# Patient Record
Sex: Male | Born: 1950 | Race: White | Hispanic: No | Smoking: Former smoker
Health system: Southern US, Community
[De-identification: ages and names within clinical notes are randomized; demographics above are authoritative.]

## PROBLEM LIST (undated history)

## (undated) ENCOUNTER — Emergency Department (HOSPITAL_COMMUNITY): Discharge status: Absent without leave | Payer: Medicare Other

## (undated) DIAGNOSIS — J342 Deviated nasal septum: Secondary | ICD-10-CM

## (undated) DIAGNOSIS — I480 Paroxysmal atrial fibrillation: Secondary | ICD-10-CM

## (undated) DIAGNOSIS — D689 Coagulation defect, unspecified: Secondary | ICD-10-CM

## (undated) DIAGNOSIS — F418 Other specified anxiety disorders: Secondary | ICD-10-CM

## (undated) DIAGNOSIS — N401 Enlarged prostate with lower urinary tract symptoms: Secondary | ICD-10-CM

## (undated) DIAGNOSIS — F32A Depression, unspecified: Secondary | ICD-10-CM

## (undated) DIAGNOSIS — I499 Cardiac arrhythmia, unspecified: Secondary | ICD-10-CM

## (undated) DIAGNOSIS — E785 Hyperlipidemia, unspecified: Secondary | ICD-10-CM

## (undated) DIAGNOSIS — Z87442 Personal history of urinary calculi: Secondary | ICD-10-CM

## (undated) DIAGNOSIS — I219 Acute myocardial infarction, unspecified: Secondary | ICD-10-CM

## (undated) DIAGNOSIS — Z8619 Personal history of other infectious and parasitic diseases: Secondary | ICD-10-CM

## (undated) DIAGNOSIS — F329 Major depressive disorder, single episode, unspecified: Secondary | ICD-10-CM

## (undated) DIAGNOSIS — I2510 Atherosclerotic heart disease of native coronary artery without angina pectoris: Secondary | ICD-10-CM

## (undated) DIAGNOSIS — N138 Other obstructive and reflux uropathy: Secondary | ICD-10-CM

## (undated) DIAGNOSIS — I10 Essential (primary) hypertension: Secondary | ICD-10-CM

## (undated) DIAGNOSIS — M545 Low back pain, unspecified: Secondary | ICD-10-CM

## (undated) DIAGNOSIS — IMO0001 Reserved for inherently not codable concepts without codable children: Secondary | ICD-10-CM

## (undated) DIAGNOSIS — F419 Anxiety disorder, unspecified: Secondary | ICD-10-CM

## (undated) DIAGNOSIS — E119 Type 2 diabetes mellitus without complications: Secondary | ICD-10-CM

## (undated) DIAGNOSIS — G8929 Other chronic pain: Secondary | ICD-10-CM

## (undated) DIAGNOSIS — M1990 Unspecified osteoarthritis, unspecified site: Secondary | ICD-10-CM

## (undated) DIAGNOSIS — G2581 Restless legs syndrome: Secondary | ICD-10-CM

## (undated) DIAGNOSIS — J60 Coalworker's pneumoconiosis: Secondary | ICD-10-CM

## (undated) DIAGNOSIS — M19011 Primary osteoarthritis, right shoulder: Secondary | ICD-10-CM

## (undated) HISTORY — DX: Other obstructive and reflux uropathy: N40.1

## (undated) HISTORY — DX: Coagulation defect, unspecified: D68.9

## (undated) HISTORY — DX: Low back pain, unspecified: M54.50

## (undated) HISTORY — DX: Other chronic pain: G89.29

## (undated) HISTORY — DX: Other obstructive and reflux uropathy: N13.8

## (undated) HISTORY — DX: Restless legs syndrome: G25.81

## (undated) HISTORY — DX: Personal history of other infectious and parasitic diseases: Z86.19

## (undated) HISTORY — DX: Low back pain: M54.5

## (undated) HISTORY — DX: Type 2 diabetes mellitus without complications: E11.9

## (undated) HISTORY — PX: JOINT REPLACEMENT: SHX530

## (undated) HISTORY — PX: EYE SURGERY: SHX253

## (undated) HISTORY — DX: Atherosclerotic heart disease of native coronary artery without angina pectoris: I25.10

## (undated) HISTORY — DX: Other specified anxiety disorders: F41.8

## (undated) HISTORY — DX: Coalworker's pneumoconiosis: J60

## (undated) HISTORY — DX: Hyperlipidemia, unspecified: E78.5

## (undated) HISTORY — DX: Primary osteoarthritis, right shoulder: M19.011

## (undated) HISTORY — PX: WRIST SURGERY: SHX841

## (undated) HISTORY — DX: Essential (primary) hypertension: I10

---

## 1991-01-06 HISTORY — PX: WRIST SURGERY: SHX841

## 2009-01-05 DIAGNOSIS — I219 Acute myocardial infarction, unspecified: Secondary | ICD-10-CM

## 2009-01-05 HISTORY — DX: Acute myocardial infarction, unspecified: I21.9

## 2010-01-05 DIAGNOSIS — I2510 Atherosclerotic heart disease of native coronary artery without angina pectoris: Secondary | ICD-10-CM

## 2010-01-05 HISTORY — PX: CATARACT EXTRACTION W/ INTRAOCULAR LENS IMPLANT: SHX1309

## 2010-01-05 HISTORY — DX: Atherosclerotic heart disease of native coronary artery without angina pectoris: I25.10

## 2010-02-05 HISTORY — PX: CORONARY ANGIOPLASTY WITH STENT PLACEMENT: SHX49

## 2010-11-23 DIAGNOSIS — M7500 Adhesive capsulitis of unspecified shoulder: Secondary | ICD-10-CM | POA: Insufficient documentation

## 2010-11-24 DIAGNOSIS — M19012 Primary osteoarthritis, left shoulder: Secondary | ICD-10-CM | POA: Insufficient documentation

## 2012-09-05 HISTORY — PX: COLONOSCOPY W/ POLYPECTOMY: SHX1380

## 2012-09-22 LAB — CBC AND DIFFERENTIAL
Hemoglobin: 14.3 g/dL (ref 13.5–17.5)
Platelets: 186 10*3/uL (ref 150–399)
WBC: 3.8 10^3/mL

## 2012-09-22 LAB — HM COLONOSCOPY

## 2014-01-05 DIAGNOSIS — M19011 Primary osteoarthritis, right shoulder: Secondary | ICD-10-CM

## 2014-01-05 HISTORY — DX: Primary osteoarthritis, right shoulder: M19.011

## 2014-01-16 DIAGNOSIS — F330 Major depressive disorder, recurrent, mild: Secondary | ICD-10-CM | POA: Diagnosis not present

## 2014-01-23 DIAGNOSIS — H43399 Other vitreous opacities, unspecified eye: Secondary | ICD-10-CM | POA: Diagnosis not present

## 2014-01-23 DIAGNOSIS — H0289 Other specified disorders of eyelid: Secondary | ICD-10-CM | POA: Diagnosis not present

## 2014-01-23 DIAGNOSIS — H26499 Other secondary cataract, unspecified eye: Secondary | ICD-10-CM | POA: Diagnosis not present

## 2014-01-23 DIAGNOSIS — Z961 Presence of intraocular lens: Secondary | ICD-10-CM | POA: Diagnosis not present

## 2014-02-01 DIAGNOSIS — M19011 Primary osteoarthritis, right shoulder: Secondary | ICD-10-CM | POA: Diagnosis not present

## 2014-02-01 DIAGNOSIS — M25511 Pain in right shoulder: Secondary | ICD-10-CM | POA: Diagnosis not present

## 2014-03-13 DIAGNOSIS — F330 Major depressive disorder, recurrent, mild: Secondary | ICD-10-CM | POA: Diagnosis not present

## 2014-04-17 DIAGNOSIS — J329 Chronic sinusitis, unspecified: Secondary | ICD-10-CM | POA: Diagnosis not present

## 2014-04-17 DIAGNOSIS — I10 Essential (primary) hypertension: Secondary | ICD-10-CM | POA: Diagnosis not present

## 2014-04-17 DIAGNOSIS — D649 Anemia, unspecified: Secondary | ICD-10-CM | POA: Diagnosis not present

## 2014-05-08 DIAGNOSIS — F330 Major depressive disorder, recurrent, mild: Secondary | ICD-10-CM | POA: Diagnosis not present

## 2014-07-03 DIAGNOSIS — F330 Major depressive disorder, recurrent, mild: Secondary | ICD-10-CM | POA: Diagnosis not present

## 2014-07-05 DIAGNOSIS — M25511 Pain in right shoulder: Secondary | ICD-10-CM | POA: Diagnosis not present

## 2014-08-14 DIAGNOSIS — N401 Enlarged prostate with lower urinary tract symptoms: Secondary | ICD-10-CM | POA: Diagnosis not present

## 2014-08-15 DIAGNOSIS — I259 Chronic ischemic heart disease, unspecified: Secondary | ICD-10-CM | POA: Diagnosis not present

## 2014-08-15 DIAGNOSIS — I10 Essential (primary) hypertension: Secondary | ICD-10-CM | POA: Diagnosis not present

## 2014-08-15 DIAGNOSIS — R739 Hyperglycemia, unspecified: Secondary | ICD-10-CM | POA: Diagnosis not present

## 2014-08-28 DIAGNOSIS — F330 Major depressive disorder, recurrent, mild: Secondary | ICD-10-CM | POA: Diagnosis not present

## 2014-11-05 DIAGNOSIS — F330 Major depressive disorder, recurrent, mild: Secondary | ICD-10-CM | POA: Diagnosis not present

## 2014-11-05 DIAGNOSIS — Z23 Encounter for immunization: Secondary | ICD-10-CM | POA: Diagnosis not present

## 2015-01-01 DIAGNOSIS — F330 Major depressive disorder, recurrent, mild: Secondary | ICD-10-CM | POA: Diagnosis not present

## 2015-01-01 DIAGNOSIS — F22 Delusional disorders: Secondary | ICD-10-CM | POA: Diagnosis not present

## 2015-01-24 DIAGNOSIS — M25519 Pain in unspecified shoulder: Secondary | ICD-10-CM | POA: Diagnosis not present

## 2015-01-24 DIAGNOSIS — E7800 Pure hypercholesterolemia, unspecified: Secondary | ICD-10-CM | POA: Diagnosis not present

## 2015-01-24 DIAGNOSIS — I10 Essential (primary) hypertension: Secondary | ICD-10-CM | POA: Diagnosis not present

## 2015-01-29 DIAGNOSIS — E7800 Pure hypercholesterolemia, unspecified: Secondary | ICD-10-CM | POA: Diagnosis not present

## 2015-01-29 DIAGNOSIS — H43393 Other vitreous opacities, bilateral: Secondary | ICD-10-CM | POA: Diagnosis not present

## 2015-01-29 DIAGNOSIS — Z961 Presence of intraocular lens: Secondary | ICD-10-CM | POA: Diagnosis not present

## 2015-01-29 DIAGNOSIS — H26493 Other secondary cataract, bilateral: Secondary | ICD-10-CM | POA: Diagnosis not present

## 2015-01-29 DIAGNOSIS — D3132 Benign neoplasm of left choroid: Secondary | ICD-10-CM | POA: Diagnosis not present

## 2015-01-29 LAB — COMPLETE METABOLIC PANEL WITH GFR
ALK PHOS: 72 U/L
ALT: 29
AST: 29 U/L
BILIRUBIN, TOTAL: 0.5
Creat: 0.72
Glucose: 119
SODIUM: 142

## 2015-01-29 LAB — LIPID PANEL
Cholesterol: 172
HDL: 43
LDL (calc): 109
Triglycerides: 101

## 2015-01-29 LAB — CBC
HEMOGLOBIN: 14.7 g/dL
WBC: 4.6
platelet count: 187

## 2015-02-26 DIAGNOSIS — F330 Major depressive disorder, recurrent, mild: Secondary | ICD-10-CM | POA: Diagnosis not present

## 2015-06-18 ENCOUNTER — Ambulatory Visit (INDEPENDENT_AMBULATORY_CARE_PROVIDER_SITE_OTHER): Payer: Medicare Other | Admitting: Family Medicine

## 2015-06-18 ENCOUNTER — Encounter: Payer: Self-pay | Admitting: Family Medicine

## 2015-06-18 VITALS — BP 114/78 | HR 72 | Temp 98.3°F | Ht 67.0 in | Wt 212.2 lb

## 2015-06-18 DIAGNOSIS — E1169 Type 2 diabetes mellitus with other specified complication: Secondary | ICD-10-CM | POA: Insufficient documentation

## 2015-06-18 DIAGNOSIS — F418 Other specified anxiety disorders: Secondary | ICD-10-CM | POA: Diagnosis not present

## 2015-06-18 DIAGNOSIS — E66811 Obesity, class 1: Secondary | ICD-10-CM

## 2015-06-18 DIAGNOSIS — N138 Other obstructive and reflux uropathy: Secondary | ICD-10-CM

## 2015-06-18 DIAGNOSIS — M19019 Primary osteoarthritis, unspecified shoulder: Secondary | ICD-10-CM

## 2015-06-18 DIAGNOSIS — E785 Hyperlipidemia, unspecified: Secondary | ICD-10-CM | POA: Insufficient documentation

## 2015-06-18 DIAGNOSIS — E669 Obesity, unspecified: Secondary | ICD-10-CM | POA: Insufficient documentation

## 2015-06-18 DIAGNOSIS — N401 Enlarged prostate with lower urinary tract symptoms: Secondary | ICD-10-CM

## 2015-06-18 DIAGNOSIS — I10 Essential (primary) hypertension: Secondary | ICD-10-CM | POA: Diagnosis not present

## 2015-06-18 DIAGNOSIS — M129 Arthropathy, unspecified: Secondary | ICD-10-CM

## 2015-06-18 DIAGNOSIS — N400 Enlarged prostate without lower urinary tract symptoms: Secondary | ICD-10-CM | POA: Insufficient documentation

## 2015-06-18 DIAGNOSIS — I2510 Atherosclerotic heart disease of native coronary artery without angina pectoris: Secondary | ICD-10-CM

## 2015-06-18 MED ORDER — FLUOXETINE HCL 10 MG PO CAPS: 10.0000 mg | ORAL_CAPSULE | Freq: Every day | ORAL | Status: DC

## 2015-06-18 NOTE — Assessment & Plan Note (Addendum)
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 Asks about transition from effient to aspirin. Awaiting records. Will refer to cardiology. Currently asxs

## 2015-06-18 NOTE — Assessment & Plan Note (Signed)
Await records from prior ortho - will likely refer to local orthopedic practice.

## 2015-06-18 NOTE — Assessment & Plan Note (Addendum)
Chronic, stable on rapaflo. Continue Previously followed by urology - next visit will discuss referral vs following BPH at our office.

## 2015-06-18 NOTE — Assessment & Plan Note (Signed)
Check FLP next labwork. Continue fish oil 4gm daily + pravastatin 40mg  daily.

## 2015-06-18 NOTE — Assessment & Plan Note (Signed)
Previously saw psych. Reviewed current regimen of prozac and xanax. Pt states he's tapering off prozac as he's doing much better with prior stressor (SIL) removed from his life (after recent move to [place]). Reviewed continued taper of prozac. Discussed optimum use of xanax as temporary measure not daily scheduled anti anxiety or insomnia medication. Will further discuss at future visit.

## 2015-06-18 NOTE — Progress Notes (Signed)
BP 114/78 mmHg  Pulse 72  Temp(Src) 98.3 F (36.8 C) (Oral)  Ht 5\' 7"  (1.702 m)  Wt 212 lb 4 oz (96.276 kg)  BMI 33.24 kg/m2   CC: new pt to establish care  Subjective:    Patient ID: Travis Howell, male    DOB: Nov 08, 1950, 65 y.o.   MRN: [id number]  HPI: Travis Howell is a 65 y.o. male presenting on 06/18/2015 for [place] here from [place]. Presents with significant other.   Ongoing R>L shoulder pain from known arthritis - prior saw orthopedist who recommended replacement but at age 89. Has receive steroid injection in the past. Latest PT to left shoulder ~2013 in [facility].   Chronic insomnia and anxiety - on prozac daily and xanax nightly. Has had some problems with sister in law and family. This was started by psychiatrist. He has been self tapering off prozac - currently taking 10mg  Q3 days. Requests 1 more refill prozac 30d supply.  HTN - Compliant with current antihypertensive regimen of exforge 5/160mg  1/2 tab daily, eplerenone 25mg  daily. Does check blood pressures at home: well controlled. No low blood pressure readings or symptoms of dizziness/syncope. Denies HA, vision changes, CP/tightness, SOB, leg swelling.    HLD - compliant with fish oil 4 tab daily and pravastatin 40mg  daily. lovaza was too expensive.   CAD s/p stent placement 2012 - was told by cardiologist 4 yrs ago he could come off effient and transition to aspirin daily. Last saw cardiologist 2013. Denies current chest pain, tightness, dyspnea.   BPH - rapaflo 8mg  daily. Controlling symptoms well.   Preventative: Overdue for CPE.   Relevant past medical, surgical, family and social history reviewed and updated as indicated. Interim medical history since our last visit reviewed. Allergies and medications reviewed and updated. No current outpatient prescriptions on file prior to visit.   No current facility-administered medications on file prior to visit.    Review  of Systems Per HPI unless specifically indicated in ROS section     Objective:    BP 114/78 mmHg  Pulse 72  Temp(Src) 98.3 F (36.8 C) (Oral)  Ht 5\' 7"  (1.702 m)  Wt 212 lb 4 oz (96.276 kg)  BMI 33.24 kg/m2  Wt Readings from Last 3 Encounters:  06/18/15 212 lb 4 oz (96.276 kg)    Physical Exam  Constitutional: He appears well-developed and well-nourished. No distress.  HENT:  Mouth/Throat: Oropharynx is clear and moist. No oropharyngeal exudate.  Neck: No thyromegaly present.  Cardiovascular: Normal rate, regular rhythm, normal heart sounds and intact distal pulses.   No murmur heard. Pulmonary/Chest: Effort normal and breath sounds normal. No respiratory distress. He has no wheezes. He has no rales.  Musculoskeletal: He exhibits no edema.  Skin: Skin is warm and dry. No rash noted.  Psychiatric: He has a normal mood and affect.  Nursing note and vitals reviewed.  No results found for this or any previous visit.    Assessment & Plan:   Problem List Items Addressed This Visit    Shoulder arthritis    Await records from prior ortho - will likely refer to local orthopedic practice.       Depression with anxiety    Previously saw psych. Reviewed current regimen of prozac and xanax. Pt states he's tapering off prozac as he's doing much better with prior stressor (SIL) removed from his life (after recent move to [place]). Reviewed continued taper of prozac. Discussed optimum  use of xanax as temporary measure not daily scheduled anti anxiety or insomnia medication. Will further discuss at future visit.       CAD (coronary artery disease)    H/o MI 2012 s/p stent. Asks about transition from effient to aspirin. Awaiting records. Will refer to cardiology. Currently asxs      Relevant Medications   eplerenone (INSPRA) 25 MG tablet   pravastatin (PRAVACHOL) 40 MG tablet   amLODipine-valsartan (EXFORGE) 5-160 MG tablet   Other Relevant Orders   Ambulatory referral to Cardiology    HTN (hypertension) - Primary    Chronic, stable. Continue current regimen.      Relevant Medications   eplerenone (INSPRA) 25 MG tablet   pravastatin (PRAVACHOL) 40 MG tablet   amLODipine-valsartan (EXFORGE) 5-160 MG tablet   HLD (hyperlipidemia)    Check FLP next labwork. Continue fish oil 4gm daily + pravastatin 40mg  daily.       Relevant Medications   eplerenone (INSPRA) 25 MG tablet   pravastatin (PRAVACHOL) 40 MG tablet   amLODipine-valsartan (EXFORGE) 5-160 MG tablet   BPH (benign prostatic hypertrophy) with urinary obstruction    Chronic, stable on rapaflo. Continue      Relevant Medications   silodosin (RAPAFLO) 8 MG CAPS capsule   Obesity, Class I, BMI 30-34.9       Follow up plan: Return in about 3 months (around 09/18/2015) for medicare wellness visit.  [name], MD

## 2015-06-18 NOTE — Assessment & Plan Note (Signed)
Chronic, stable. Continue current regimen. 

## 2015-06-18 NOTE — Patient Instructions (Addendum)
We will refer you to cardiology. I have refilled 30# prozac to local pharmacy. Good to meet you today, call us with questions. Return in 3 months for medicare wellness visit.

## 2015-06-18 NOTE — Progress Notes (Signed)
Pre visit review using our clinic review tool, if applicable. No additional management support is needed unless otherwise documented below in the visit note. 

## 2015-07-04 ENCOUNTER — Encounter: Payer: Self-pay | Admitting: Family Medicine

## 2015-07-04 DIAGNOSIS — G2581 Restless legs syndrome: Secondary | ICD-10-CM | POA: Insufficient documentation

## 2015-07-04 DIAGNOSIS — M545 Low back pain, unspecified: Secondary | ICD-10-CM | POA: Insufficient documentation

## 2015-07-04 DIAGNOSIS — G8929 Other chronic pain: Secondary | ICD-10-CM | POA: Insufficient documentation

## 2015-07-10 ENCOUNTER — Encounter: Payer: Self-pay | Admitting: *Deleted

## 2015-07-17 DIAGNOSIS — M19012 Primary osteoarthritis, left shoulder: Secondary | ICD-10-CM | POA: Diagnosis not present

## 2015-07-17 DIAGNOSIS — M19011 Primary osteoarthritis, right shoulder: Secondary | ICD-10-CM | POA: Diagnosis not present

## 2015-08-07 ENCOUNTER — Encounter: Payer: Self-pay | Admitting: Cardiovascular Disease

## 2015-08-19 ENCOUNTER — Encounter: Payer: Self-pay | Admitting: Interventional Cardiology

## 2015-08-19 ENCOUNTER — Ambulatory Visit (INDEPENDENT_AMBULATORY_CARE_PROVIDER_SITE_OTHER): Payer: Medicare Other | Admitting: Interventional Cardiology

## 2015-08-19 VITALS — BP 110/70 | HR 64 | Ht 67.0 in | Wt 221.0 lb

## 2015-08-19 DIAGNOSIS — I252 Old myocardial infarction: Secondary | ICD-10-CM | POA: Diagnosis not present

## 2015-08-19 DIAGNOSIS — I2510 Atherosclerotic heart disease of native coronary artery without angina pectoris: Secondary | ICD-10-CM | POA: Diagnosis not present

## 2015-08-19 DIAGNOSIS — I10 Essential (primary) hypertension: Secondary | ICD-10-CM

## 2015-08-19 DIAGNOSIS — E785 Hyperlipidemia, unspecified: Secondary | ICD-10-CM

## 2015-08-19 MED ORDER — ATORVASTATIN CALCIUM 20 MG PO TABS
20.0000 mg | ORAL_TABLET | Freq: Every day | ORAL | 3 refills | Status: DC
Start: 2015-08-19 — End: 2015-11-08
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
## 2015-08-19 NOTE — Patient Instructions (Signed)
Medication Instructions:  Stop taking Pravastatin and start taking Atorvastatin 20 mg daily. All other medications remain the same.  Labwork: In 3 months-Remember not to eat or drink after midnight the night before labs are drawn.  Testing/Procedures: Your physician has requested that you have an echocardiogram. Echocardiography is a painless test that uses sound waves to create images of your heart. It provides your doctor with information about the size and shape of your heart and how well your heart's chambers and valves are working. This procedure takes approximately one hour. There are no restrictions for this procedure.   Follow-Up: Your physician wants you to follow-up in: 6 months. You will receive a reminder letter in the mail two months in advance. If you don't receive a letter, please call our office to schedule the follow-up appointment.   Any Other Special Instructions Will Be Listed Below (If Applicable).     If you need a refill on your cardiac medications before your next appointment, please call your pharmacy.

## 2015-08-27 ENCOUNTER — Other Ambulatory Visit: Payer: Self-pay

## 2015-08-27 ENCOUNTER — Ambulatory Visit (HOSPITAL_COMMUNITY): Payer: Medicare Other | Attending: Cardiology

## 2015-08-27 DIAGNOSIS — I119 Hypertensive heart disease without heart failure: Secondary | ICD-10-CM | POA: Insufficient documentation

## 2015-08-27 DIAGNOSIS — I77810 Thoracic aortic ectasia: Secondary | ICD-10-CM | POA: Insufficient documentation

## 2015-08-27 DIAGNOSIS — I2510 Atherosclerotic heart disease of native coronary artery without angina pectoris: Secondary | ICD-10-CM

## 2015-08-27 DIAGNOSIS — E785 Hyperlipidemia, unspecified: Secondary | ICD-10-CM | POA: Diagnosis not present

## 2015-09-04 ENCOUNTER — Other Ambulatory Visit: Payer: Self-pay | Admitting: Family Medicine

## 2015-09-04 ENCOUNTER — Other Ambulatory Visit: Payer: Self-pay | Admitting: Orthopedic Surgery

## 2015-09-04 DIAGNOSIS — M25511 Pain in right shoulder: Secondary | ICD-10-CM

## 2015-09-04 NOTE — Telephone Encounter (Signed)
Ok to refill? Supposed to be tapering off according to office notes from June.

## 2015-09-12 ENCOUNTER — Telehealth: Payer: Self-pay

## 2015-09-12 ENCOUNTER — Other Ambulatory Visit: Payer: Self-pay | Admitting: Orthopedic Surgery

## 2015-09-12 NOTE — Telephone Encounter (Signed)
**Note De-Identified Nyellie Yetter Obfuscation** The pt needs to have right total shoulder replacement with [facility]. The pt is taking Effient 10 mg daily. They are requesting:  1. Cardiac clearance 2. Instructions on when the pt should stop taking Effient prior to procedure.  Also, they are asking when the pt should stop taking ASA. According to the pts medications list he is not taking ASA. I called to ask the pt but no one answered.  Please advise.

## 2015-09-16 ENCOUNTER — Ambulatory Visit
Admission: RE | Admit: 2015-09-16 | Discharge: 2015-09-16 | Disposition: A | Discharge status: Home / Self Care | Payer: Medicare Other | Source: Ambulatory Visit | Attending: Orthopedic Surgery | Admitting: Orthopedic Surgery

## 2015-09-16 DIAGNOSIS — M25511 Pain in right shoulder: Secondary | ICD-10-CM

## 2015-09-16 DIAGNOSIS — Z01818 Encounter for other preprocedural examination: Secondary | ICD-10-CM | POA: Diagnosis not present

## 2015-09-16 NOTE — Telephone Encounter (Signed)
Normal ejection fraction.  No further cardiac testing needed before surgery.  OK to hold Effient 5 days prior to surgery.  If he is taking aspirin, he can hold this for 7 days prior.

## 2015-09-19 NOTE — Telephone Encounter (Signed)
**Note De-Identified Kainalu Heggs Obfuscation** This message has been faxed to [place]: Clayborne Dana at [phone]. I did receive confirmation that the fax was successful.

## 2015-09-26 ENCOUNTER — Encounter (HOSPITAL_COMMUNITY): Payer: Self-pay

## 2015-09-26 ENCOUNTER — Encounter (HOSPITAL_COMMUNITY)
Admission: RE | Admit: 2015-09-26 | Discharge: 2015-09-26 | Disposition: A | Discharge status: Home / Self Care | Payer: Medicare Other | Source: Ambulatory Visit | Attending: Orthopedic Surgery | Admitting: Orthopedic Surgery

## 2015-09-26 DIAGNOSIS — Z01818 Encounter for other preprocedural examination: Secondary | ICD-10-CM | POA: Diagnosis not present

## 2015-09-26 DIAGNOSIS — J9811 Atelectasis: Secondary | ICD-10-CM | POA: Diagnosis not present

## 2015-09-26 HISTORY — DX: Deviated nasal septum: J34.2

## 2015-09-26 HISTORY — DX: Anxiety disorder, unspecified: F41.9

## 2015-09-26 HISTORY — DX: Depression, unspecified: F32.A

## 2015-09-26 HISTORY — DX: Major depressive disorder, single episode, unspecified: F32.9

## 2015-09-26 HISTORY — DX: Unspecified osteoarthritis, unspecified site: M19.90

## 2015-09-26 HISTORY — DX: Reserved for inherently not codable concepts without codable children: IMO0001

## 2015-09-26 HISTORY — DX: Acute myocardial infarction, unspecified: I21.9

## 2015-09-26 LAB — COMPREHENSIVE METABOLIC PANEL
ALBUMIN: 4.2 g/dL (ref 3.5–5.0)
ALT: 27 U/L (ref 17–63)
ANION GAP: 10 (ref 5–15)
AST: 30 U/L (ref 15–41)
Alkaline Phosphatase: 74 U/L (ref 38–126)
BUN: 8 mg/dL (ref 6–20)
CHLORIDE: 106 mmol/L (ref 101–111)
CO2: 23 mmol/L (ref 22–32)
Calcium: 9.3 mg/dL (ref 8.9–10.3)
Creatinine, Ser: 0.73 mg/dL (ref 0.61–1.24)
GFR calc non Af Amer: >60 mL/min (ref >60)
GLUCOSE: 115 mg/dL — AB (ref 65–99)
Potassium: 4.1 mmol/L (ref 3.5–5.1)
SODIUM: 139 mmol/L (ref 135–145)
Total Bilirubin: 0.9 mg/dL (ref 0.3–1.2)
Total Protein: 7.4 g/dL (ref 6.5–8.1)

## 2015-09-26 LAB — URINALYSIS, ROUTINE W REFLEX MICROSCOPIC
BILIRUBIN URINE: NEGATIVE
GLUCOSE, UA: NEGATIVE mg/dL
HGB URINE DIPSTICK: NEGATIVE
KETONES UR: NEGATIVE mg/dL
Nitrite: NEGATIVE
PROTEIN: NEGATIVE mg/dL
Specific Gravity, Urine: 1.013 (ref 1.005–1.030)
pH: 6.5 (ref 5.0–8.0)

## 2015-09-26 LAB — CBC WITH DIFFERENTIAL/PLATELET
BASOS PCT: 0 %
Basophils Absolute: 0 10*3/uL (ref 0.0–0.1)
EOS ABS: 0.2 10*3/uL (ref 0.0–0.7)
EOS PCT: 3 %
HCT: 42.3 % (ref 39.0–52.0)
HEMOGLOBIN: 14.9 g/dL (ref 13.0–17.0)
LYMPHS ABS: 1.3 10*3/uL (ref 0.7–4.0)
Lymphocytes Relative: 21 %
MCH: 33.9 pg (ref 26.0–34.0)
MCHC: 35.2 g/dL (ref 30.0–36.0)
MCV: 96.1 fL (ref 78.0–100.0)
MONOS PCT: 6 %
Monocytes Absolute: 0.3 10*3/uL (ref 0.1–1.0)
NEUTROS PCT: 70 %
Neutro Abs: 4.3 10*3/uL (ref 1.7–7.7)
PLATELETS: 168 10*3/uL (ref 150–400)
RBC: 4.4 MIL/uL (ref 4.22–5.81)
RDW: 12.3 % (ref 11.5–15.5)
WBC: 6.2 10*3/uL (ref 4.0–10.5)

## 2015-09-26 LAB — TYPE AND SCREEN
ABO/RH(D): A POS
ANTIBODY SCREEN: NEGATIVE

## 2015-09-26 LAB — ABO/RH: ABO/RH(D): A POS

## 2015-09-26 LAB — URINE MICROSCOPIC-ADD ON
BACTERIA UA: NONE SEEN
Squamous Epithelial / HPF: NONE SEEN

## 2015-09-26 LAB — PROTIME-INR
INR: 1.01
Prothrombin Time: 13.3 seconds (ref 11.4–15.2)

## 2015-09-26 LAB — APTT: aPTT: 32 seconds (ref 24–36)

## 2015-09-26 LAB — SURGICAL PCR SCREEN
MRSA, PCR: NEGATIVE
STAPHYLOCOCCUS AUREUS: POSITIVE — AB

## 2015-09-26 NOTE — Progress Notes (Signed)
I called a prescription for Mupirocin ointment to [facility], [address].

## 2015-09-26 NOTE — Progress Notes (Signed)
Mr Travis Howell has a history of a MI and a Cardiac Stent in 2012- in [place].  Patient denies chest pain, shortness of breath at rest, dizziness.  Patient was seen by Dr Irish Lack in August 2017 for clearance.  Patient had an echo and cleared , no further testing required.

## 2015-09-26 NOTE — Progress Notes (Signed)
   09/26/15 1034  OBSTRUCTIVE SLEEP APNEA  Have you ever been diagnosed with sleep apnea through a sleep study? No  Do you snore loudly (loud enough to be heard through closed doors)?  1  Do you often feel tired, fatigued, or sleepy during the daytime (such as falling asleep during driving or talking to someone)? 0  Has anyone observed you stop breathing during your sleep? 1  Do you have, or are you being treated for high blood pressure? 1  BMI more than 35 kg/m2? 1  Age > 50 (1-yes) 1  Neck circumference greater than:Male 16 inches or larger, Male 17inches or larger? 1 (41.5)  Male Gender (Yes=1) 1  Obstructive Sleep Apnea Score 7  Score 5 or greater  Results sent to PCP

## 2015-09-26 NOTE — Pre-Procedure Instructions (Signed)
    Travis Howell  09/26/2015     Your procedure is scheduled on Thursday, September 28.                Report to [facility] Admitting at 5:30 AM                    Your surgery or procedure is scheduled for 7:30 AM   Call this number if you have problems the morning of surgery:[phone]                  For any other questions, please call [phone], Monday - Friday 8 AM - 4 PM.  Remember:  Do not eat food or drink liquids after midnight Wednesday, September 27.  Take these medicines the morning of surgery with A SIP OF WATER : atorvastatin (LIPITOR), FLUoxetine (PROZAC), silodosin (RAPAFLO).                May take ALPRAZolam [name]) if needed.              1 Week prior to surgery STOP taking Aspirin , Aspirin Products (Goody Powder, Excedrin Migraine), Ibuprofen (Advil), Naproxen (Aleve), Vitamins and Herbal Products (ie Fish Oil)  Do not wear jewelry, make-up or nail polish.  Do not wear lotions, powders, or perfumes, or deodorant.  Men may shave face and neck.  Do not bring valuables to the hospital.  [facility] is not responsible for any belongings or valuables.  Contacts, dentures or bridgework may not be worn into surgery.  Leave your suitcase in the car.  After surgery it may be brought to your room.  For patients admitted to the hospital, discharge time will be determined by your treatment team.  Patients discharged the day of surgery will not be allowed to drive home.   Name and phone number of your driver: -  Special instructions:  Review  [facility] - Preparing For Surgery.  Please read over the following fact sheets that you were given: [facility]- Preparing For Surgery and Patient Instructions for Mupirocin Application, Incentive Spirometry and Pain Booklet.

## 2015-09-29 ENCOUNTER — Other Ambulatory Visit: Payer: Self-pay | Admitting: Family Medicine

## 2015-10-01 ENCOUNTER — Other Ambulatory Visit: Payer: Self-pay | Admitting: Family Medicine

## 2015-10-01 ENCOUNTER — Ambulatory Visit (INDEPENDENT_AMBULATORY_CARE_PROVIDER_SITE_OTHER): Payer: Medicare Other

## 2015-10-01 ENCOUNTER — Other Ambulatory Visit: Payer: 59

## 2015-10-01 VITALS — BP 112/80 | HR 72 | Temp 98.5°F | Ht 67.25 in | Wt 221.5 lb

## 2015-10-01 DIAGNOSIS — Z114 Encounter for screening for human immunodeficiency virus [HIV]: Secondary | ICD-10-CM | POA: Diagnosis not present

## 2015-10-01 DIAGNOSIS — I10 Essential (primary) hypertension: Secondary | ICD-10-CM

## 2015-10-01 DIAGNOSIS — Z1159 Encounter for screening for other viral diseases: Secondary | ICD-10-CM

## 2015-10-01 DIAGNOSIS — Z125 Encounter for screening for malignant neoplasm of prostate: Secondary | ICD-10-CM

## 2015-10-01 DIAGNOSIS — Z0000 Encounter for general adult medical examination without abnormal findings: Secondary | ICD-10-CM

## 2015-10-01 LAB — PSA, MEDICARE: PSA: 0.3 ng/ml (ref 0.10–4.00)

## 2015-10-01 LAB — TSH: TSH: 2.81 u[IU]/mL (ref 0.35–4.50)

## 2015-10-01 NOTE — Progress Notes (Signed)
Pre visit review using our clinic review tool, if applicable. No additional management support is needed unless otherwise documented below in the visit note. 

## 2015-10-01 NOTE — Progress Notes (Signed)
PCP notes:   Health maintenance:  Flu vaccine - postponed until after surgery Hep C screening - completed HIV screening - completed  Abnormal screenings:   Hearing - failed  Patient concerns:   None  Nurse concerns:  None  Next PCP appt:   10/07/2015 @ 1030  I reviewed health advisor's note, was available for consultation on the day of service listed in this note, and agree with documentation and plan. Elsie Stain, MD.

## 2015-10-01 NOTE — Patient Instructions (Signed)
Travis Howell , Thank you for taking time to come for your Medicare Wellness Visit. I appreciate your ongoing commitment to your health goals. Please review the following plan we discussed and let me know if I can assist you in the future.   These are the goals we discussed: Goals    . Increase water intake          Starting 10/01/2015, I will continue to drink at least 6-8 glasses of water daily.        This is a list of the screening recommended for you and due dates:  Health Maintenance  Topic Date Due  . Flu Shot  01/05/2016*  . DTaP/Tdap/Td vaccine (1 - Tdap) 09/30/2025*  . Tetanus Vaccine  01/06/2020  . Colon Cancer Screening  10/03/2022  . Shingles Vaccine  Addressed  .  Hepatitis C: One time screening is recommended by Center for Disease Control  (CDC) for  adults born from [birth year] through 1964.   Completed  . HIV Screening  Completed  *Topic was postponed. The date shown is not the original due date.   Preventive Care for Adults  A healthy lifestyle and preventive care can promote health and wellness. Preventive health guidelines for adults include the following key practices.  . A routine yearly physical is a good way to check with your health care provider about your health and preventive screening. It is a chance to share any concerns and updates on your health and to receive a thorough exam.  . Visit your dentist for a routine exam and preventive care every 6 months. Brush your teeth twice a day and floss once a day. Good oral hygiene prevents tooth decay and gum disease.  . The frequency of eye exams is based on your age, health, family medical history, use  of contact lenses, and other factors. Follow your health care provider's ecommendations for frequency of eye exams.  . Eat a healthy diet. Foods like vegetables, fruits, whole grains, low-fat dairy products, and lean protein foods contain the nutrients you need without too many calories. Decrease your intake of foods  high in solid fats, added sugars, and salt. Eat the right amount of calories for you. Get information about a proper diet from your health care provider, if necessary.  . Regular physical exercise is one of the most important things you can do for your health. Most adults should get at least 150 minutes of moderate-intensity exercise (any activity that increases your heart rate and causes you to sweat) each week. In addition, most adults need muscle-strengthening exercises on 2 or more days a week.  Silver Sneakers may be a benefit available to you. To determine eligibility, you may visit the website: www.silversneakers.com or contact program at 859-653-8922 Mon-Fri between 8AM-8PM.   . Maintain a healthy weight. The body mass index (BMI) is a screening tool to identify possible weight problems. It provides an estimate of body fat based on height and weight. Your health care provider can find your BMI and can help you achieve or maintain a healthy weight.   For adults 20 years and older: ? A BMI below 18.5 is considered underweight. ? A BMI of 18.5 to 24.9 is normal. ? A BMI of 25 to 29.9 is considered overweight. ? A BMI of 30 and above is considered obese.   . Maintain normal blood lipids and cholesterol levels by exercising and minimizing your intake of saturated fat. Eat a balanced diet with plenty of fruit and  vegetables. Blood tests for lipids and cholesterol should begin at age 42 and be repeated every 5 years. If your lipid or cholesterol levels are high, you are over 50, or you are at high risk for heart disease, you may need your cholesterol levels checked more frequently. Ongoing high lipid and cholesterol levels should be treated with medicines if diet and exercise are not working.  . If you smoke, find out from your health care provider how to quit. If you do not use tobacco, please do not start.  . If you choose to drink alcohol, please do not consume more than 2 drinks per day.  One drink is considered to be 12 ounces (355 mL) of beer, 5 ounces (148 mL) of wine, or 1.5 ounces (44 mL) of liquor.  . If you are 31-28 years old, ask your health care provider if you should take aspirin to prevent strokes.  . Use sunscreen. Apply sunscreen liberally and repeatedly throughout the day. You should seek shade when your shadow is shorter than you. Protect yourself by wearing long sleeves, pants, a wide-brimmed hat, and sunglasses year round, whenever you are outdoors.  . Once a month, do a whole body skin exam, using a mirror to look at the skin on your back. Tell your health care provider of new moles, moles that have irregular borders, moles that are larger than a pencil eraser, or moles that have changed in shape or color.
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
## 2015-10-02 LAB — HIV ANTIBODY (ROUTINE TESTING W REFLEX): HIV: NONREACTIVE

## 2015-10-02 LAB — HEPATITIS C ANTIBODY: HCV AB: NEGATIVE

## 2015-10-03 ENCOUNTER — Encounter (HOSPITAL_COMMUNITY): Payer: Self-pay | Admitting: Anesthesiology

## 2015-10-03 ENCOUNTER — Inpatient Hospital Stay (HOSPITAL_COMMUNITY)
Admission: RE | Admit: 2015-10-03 | Discharge: 2015-10-04 | DRG: 483 | Disposition: A | Discharge status: Home / Self Care | Payer: Medicare Other | Source: Ambulatory Visit | Attending: Orthopedic Surgery | Admitting: Orthopedic Surgery

## 2015-10-03 ENCOUNTER — Inpatient Hospital Stay (HOSPITAL_COMMUNITY): Payer: Medicare Other | Admitting: Anesthesiology

## 2015-10-03 ENCOUNTER — Other Ambulatory Visit: Payer: Self-pay | Admitting: Family Medicine

## 2015-10-03 ENCOUNTER — Encounter (HOSPITAL_COMMUNITY)
Admission: RE | Disposition: A | Discharge status: Home / Self Care | Payer: Self-pay | Source: Ambulatory Visit | Attending: Orthopedic Surgery

## 2015-10-03 ENCOUNTER — Inpatient Hospital Stay (HOSPITAL_COMMUNITY): Payer: Medicare Other

## 2015-10-03 DIAGNOSIS — I959 Hypotension, unspecified: Secondary | ICD-10-CM | POA: Diagnosis present

## 2015-10-03 DIAGNOSIS — Z7982 Long term (current) use of aspirin: Secondary | ICD-10-CM | POA: Diagnosis not present

## 2015-10-03 DIAGNOSIS — Z955 Presence of coronary angioplasty implant and graft: Secondary | ICD-10-CM

## 2015-10-03 DIAGNOSIS — I2510 Atherosclerotic heart disease of native coronary artery without angina pectoris: Secondary | ICD-10-CM | POA: Diagnosis present

## 2015-10-03 DIAGNOSIS — M19011 Primary osteoarthritis, right shoulder: Secondary | ICD-10-CM | POA: Diagnosis present

## 2015-10-03 DIAGNOSIS — M25511 Pain in right shoulder: Secondary | ICD-10-CM | POA: Diagnosis not present

## 2015-10-03 DIAGNOSIS — Z471 Aftercare following joint replacement surgery: Secondary | ICD-10-CM | POA: Diagnosis not present

## 2015-10-03 DIAGNOSIS — Z8249 Family history of ischemic heart disease and other diseases of the circulatory system: Secondary | ICD-10-CM

## 2015-10-03 DIAGNOSIS — N400 Enlarged prostate without lower urinary tract symptoms: Secondary | ICD-10-CM | POA: Diagnosis present

## 2015-10-03 DIAGNOSIS — Z820 Family history of epilepsy and other diseases of the nervous system: Secondary | ICD-10-CM | POA: Diagnosis not present

## 2015-10-03 DIAGNOSIS — Z961 Presence of intraocular lens: Secondary | ICD-10-CM | POA: Diagnosis present

## 2015-10-03 DIAGNOSIS — I252 Old myocardial infarction: Secondary | ICD-10-CM | POA: Diagnosis not present

## 2015-10-03 DIAGNOSIS — M545 Low back pain: Secondary | ICD-10-CM | POA: Diagnosis present

## 2015-10-03 DIAGNOSIS — E785 Hyperlipidemia, unspecified: Secondary | ICD-10-CM | POA: Diagnosis present

## 2015-10-03 DIAGNOSIS — Z87891 Personal history of nicotine dependence: Secondary | ICD-10-CM

## 2015-10-03 DIAGNOSIS — Z833 Family history of diabetes mellitus: Secondary | ICD-10-CM | POA: Diagnosis not present

## 2015-10-03 DIAGNOSIS — Z96611 Presence of right artificial shoulder joint: Secondary | ICD-10-CM | POA: Diagnosis not present

## 2015-10-03 DIAGNOSIS — Z6834 Body mass index (BMI) 34.0-34.9, adult: Secondary | ICD-10-CM

## 2015-10-03 DIAGNOSIS — Z96619 Presence of unspecified artificial shoulder joint: Secondary | ICD-10-CM

## 2015-10-03 DIAGNOSIS — Z23 Encounter for immunization: Secondary | ICD-10-CM | POA: Diagnosis not present

## 2015-10-03 DIAGNOSIS — Z9842 Cataract extraction status, left eye: Secondary | ICD-10-CM | POA: Diagnosis not present

## 2015-10-03 DIAGNOSIS — I10 Essential (primary) hypertension: Secondary | ICD-10-CM | POA: Diagnosis present

## 2015-10-03 DIAGNOSIS — Z9841 Cataract extraction status, right eye: Secondary | ICD-10-CM

## 2015-10-03 DIAGNOSIS — F418 Other specified anxiety disorders: Secondary | ICD-10-CM | POA: Diagnosis present

## 2015-10-03 DIAGNOSIS — G8929 Other chronic pain: Secondary | ICD-10-CM | POA: Diagnosis present

## 2015-10-03 DIAGNOSIS — G8918 Other acute postprocedural pain: Secondary | ICD-10-CM | POA: Diagnosis not present

## 2015-10-03 DIAGNOSIS — G2581 Restless legs syndrome: Secondary | ICD-10-CM | POA: Diagnosis present

## 2015-10-03 HISTORY — PX: TOTAL SHOULDER ARTHROPLASTY: SHX126

## 2015-10-03 SURGERY — ARTHROPLASTY, SHOULDER, TOTAL
Anesthesia: General | Laterality: Right

## 2015-10-03 MED ORDER — TAMSULOSIN HCL 0.4 MG PO CAPS
0.4000 mg | ORAL_CAPSULE | Freq: Every day | ORAL | Status: DC
  Administered 2015-10-03 – 2015-10-04 (×2): 0.4 mg via ORAL
  Filled 2015-10-03 (×2): qty 1

## 2015-10-03 MED ORDER — ROCURONIUM BROMIDE 100 MG/10ML IV SOLN
INTRAVENOUS | Status: DC | PRN
  Administered 2015-10-03: 20 mg via INTRAVENOUS
  Administered 2015-10-03: 50 mg via INTRAVENOUS
  Administered 2015-10-03: 10 mg via INTRAVENOUS

## 2015-10-03 MED ORDER — AMLODIPINE BESYLATE 2.5 MG PO TABS
2.5000 mg | ORAL_TABLET | Freq: Every day | ORAL | Status: DC
  Filled 2015-10-03 (×2): qty 1

## 2015-10-03 MED ORDER — ZOLPIDEM TARTRATE 5 MG PO TABS: 5.0000 mg | ORAL_TABLET | Freq: Every evening | ORAL | Status: DC | PRN

## 2015-10-03 MED ORDER — ONDANSETRON HCL 4 MG/2ML IJ SOLN
INTRAMUSCULAR | Status: AC
  Filled 2015-10-03: qty 2

## 2015-10-03 MED ORDER — TRANEXAMIC ACID 1000 MG/10ML IV SOLN
1000.0000 mg | INTRAVENOUS | Status: DC
  Filled 2015-10-03: qty 10

## 2015-10-03 MED ORDER — ALUMINUM HYDROXIDE GEL 320 MG/5ML PO SUSP
15.0000 mL | ORAL | Status: DC | PRN
  Filled 2015-10-03: qty 30

## 2015-10-03 MED ORDER — ATORVASTATIN CALCIUM 20 MG PO TABS
20.0000 mg | ORAL_TABLET | Freq: Every day | ORAL | Status: DC
  Administered 2015-10-03 – 2015-10-04 (×2): 20 mg via ORAL
  Filled 2015-10-03 (×2): qty 1

## 2015-10-03 MED ORDER — CEFAZOLIN SODIUM-DEXTROSE 2-4 GM/100ML-% IV SOLN
2.0000 g | INTRAVENOUS | Status: AC
  Administered 2015-10-03: 2 g via INTRAVENOUS

## 2015-10-03 MED ORDER — SODIUM CHLORIDE 0.9 % IR SOLN
Status: DC | PRN
  Administered 2015-10-03: 3000 mL

## 2015-10-03 MED ORDER — FLEET ENEMA 7-19 GM/118ML RE ENEM: 1.0000 | ENEMA | Freq: Once | RECTAL | Status: DC | PRN

## 2015-10-03 MED ORDER — PROPOFOL 10 MG/ML IV BOLUS
INTRAVENOUS | Status: DC | PRN
  Administered 2015-10-03: 150 mg via INTRAVENOUS

## 2015-10-03 MED ORDER — SUGAMMADEX SODIUM 200 MG/2ML IV SOLN
INTRAVENOUS | Status: AC
  Filled 2015-10-03: qty 2

## 2015-10-03 MED ORDER — MIDAZOLAM HCL 5 MG/5ML IJ SOLN
INTRAMUSCULAR | Status: DC | PRN
  Administered 2015-10-03: 2 mg via INTRAVENOUS

## 2015-10-03 MED ORDER — FLUOXETINE HCL 10 MG PO CAPS
10.0000 mg | ORAL_CAPSULE | Freq: Every day | ORAL | Status: DC
  Filled 2015-10-03: qty 1

## 2015-10-03 MED ORDER — OXYCODONE HCL 5 MG PO TABS
5.0000 mg | ORAL_TABLET | ORAL | Status: DC | PRN
  Administered 2015-10-03: 10 mg via ORAL
  Administered 2015-10-03: 5 mg via ORAL
  Administered 2015-10-04: 10 mg via ORAL
  Filled 2015-10-03: qty 1
  Filled 2015-10-03 (×2): qty 2

## 2015-10-03 MED ORDER — TRANEXAMIC ACID 1000 MG/10ML IV SOLN
INTRAVENOUS | Status: DC | PRN
  Administered 2015-10-03: 1000 mg via INTRAVENOUS

## 2015-10-03 MED ORDER — ACETAMINOPHEN 325 MG PO TABS: 650.0000 mg | ORAL_TABLET | Freq: Four times a day (QID) | ORAL | Status: DC | PRN

## 2015-10-03 MED ORDER — FENTANYL CITRATE (PF) 100 MCG/2ML IJ SOLN
INTRAMUSCULAR | Status: DC | PRN
  Administered 2015-10-03: 100 ug via INTRAVENOUS
  Administered 2015-10-03 (×2): 50 ug via INTRAVENOUS

## 2015-10-03 MED ORDER — FENTANYL CITRATE (PF) 100 MCG/2ML IJ SOLN
INTRAMUSCULAR | Status: AC
  Filled 2015-10-03: qty 2

## 2015-10-03 MED ORDER — PHENYLEPHRINE HCL 10 MG/ML IJ SOLN
INTRAMUSCULAR | Status: DC | PRN
  Administered 2015-10-03 (×2): 80 ug via INTRAVENOUS
  Administered 2015-10-03: 40 ug via INTRAVENOUS
  Administered 2015-10-03 (×2): 80 ug via INTRAVENOUS
  Administered 2015-10-03: 160 ug via INTRAVENOUS
  Administered 2015-10-03: 80 ug via INTRAVENOUS

## 2015-10-03 MED ORDER — CEFAZOLIN SODIUM-DEXTROSE 2-4 GM/100ML-% IV SOLN
2.0000 g | Freq: Four times a day (QID) | INTRAVENOUS | Status: AC
  Administered 2015-10-03 – 2015-10-04 (×3): 2 g via INTRAVENOUS
  Filled 2015-10-03 (×3): qty 100

## 2015-10-03 MED ORDER — ONDANSETRON HCL 4 MG/2ML IJ SOLN: 4.0000 mg | Freq: Four times a day (QID) | INTRAMUSCULAR | Status: DC | PRN

## 2015-10-03 MED ORDER — EPHEDRINE 5 MG/ML INJ
INTRAVENOUS | Status: AC
  Filled 2015-10-03: qty 10

## 2015-10-03 MED ORDER — SODIUM CHLORIDE 0.9 % IV SOLN
INTRAVENOUS | Status: DC
  Administered 2015-10-03: 13:00:00 via INTRAVENOUS

## 2015-10-03 MED ORDER — POVIDONE-IODINE 7.5 % EX SOLN
Freq: Once | CUTANEOUS | Status: DC
  Filled 2015-10-03: qty 118

## 2015-10-03 MED ORDER — CEFAZOLIN SODIUM-DEXTROSE 2-4 GM/100ML-% IV SOLN
INTRAVENOUS | Status: AC
Start: 2015-10-03 — End: 2015-10-03
  Filled 2015-10-03: qty 100

## 2015-10-03 MED ORDER — DIPHENHYDRAMINE HCL 12.5 MG/5ML PO ELIX: 12.5000 mg | ORAL_SOLUTION | ORAL | Status: DC | PRN

## 2015-10-03 MED ORDER — HYDROMORPHONE HCL 1 MG/ML IJ SOLN: 0.2500 mg | INTRAMUSCULAR | Status: DC | PRN

## 2015-10-03 MED ORDER — EPHEDRINE SULFATE 50 MG/ML IJ SOLN
INTRAMUSCULAR | Status: DC | PRN
  Administered 2015-10-03: 10 mg via INTRAVENOUS
  Administered 2015-10-03: 20 mg via INTRAVENOUS

## 2015-10-03 MED ORDER — INFLUENZA VAC SPLIT QUAD 0.5 ML IM SUSY
0.5000 mL | PREFILLED_SYRINGE | INTRAMUSCULAR | Status: AC
  Administered 2015-10-04: 0.5 mL via INTRAMUSCULAR
  Filled 2015-10-03: qty 0.5

## 2015-10-03 MED ORDER — ONDANSETRON HCL 4 MG/2ML IJ SOLN
INTRAMUSCULAR | Status: DC | PRN
  Administered 2015-10-03: 4 mg via INTRAVENOUS

## 2015-10-03 MED ORDER — SUGAMMADEX SODIUM 200 MG/2ML IV SOLN
INTRAVENOUS | Status: DC | PRN
  Administered 2015-10-03: 200 mg via INTRAVENOUS

## 2015-10-03 MED ORDER — PHENOL 1.4 % MT LIQD: 1.0000 | OROMUCOSAL | Status: DC | PRN

## 2015-10-03 MED ORDER — AMLODIPINE BESYLATE-VALSARTAN 5-160 MG PO TABS: 0.5000 | ORAL_TABLET | Freq: Every day | ORAL | Status: DC

## 2015-10-03 MED ORDER — PHENYLEPHRINE HCL 10 MG/ML IJ SOLN
INTRAVENOUS | Status: DC | PRN
  Administered 2015-10-03: 50 ug/min via INTRAVENOUS

## 2015-10-03 MED ORDER — MIDAZOLAM HCL 2 MG/2ML IJ SOLN
INTRAMUSCULAR | Status: AC
  Filled 2015-10-03: qty 2

## 2015-10-03 MED ORDER — MORPHINE SULFATE (PF) 2 MG/ML IV SOLN: 1.0000 mg | INTRAVENOUS | Status: DC | PRN

## 2015-10-03 MED ORDER — MENTHOL 3 MG MT LOZG: 1.0000 | LOZENGE | OROMUCOSAL | Status: DC | PRN

## 2015-10-03 MED ORDER — 0.9 % SODIUM CHLORIDE (POUR BTL) OPTIME
TOPICAL | Status: DC | PRN
  Administered 2015-10-03: 1000 mL

## 2015-10-03 MED ORDER — FENTANYL CITRATE (PF) 100 MCG/2ML IJ SOLN
INTRAMUSCULAR | Status: AC
Start: 2015-10-03 — End: 2015-10-03
  Filled 2015-10-03: qty 2

## 2015-10-03 MED ORDER — PROMETHAZINE HCL 25 MG/ML IJ SOLN: 6.2500 mg | INTRAMUSCULAR | Status: DC | PRN

## 2015-10-03 MED ORDER — IRBESARTAN 150 MG PO TABS
75.0000 mg | ORAL_TABLET | Freq: Every day | ORAL | Status: DC
  Filled 2015-10-03 (×2): qty 1

## 2015-10-03 MED ORDER — LACTATED RINGERS IV SOLN
INTRAVENOUS | Status: DC | PRN
  Administered 2015-10-03 (×2): via INTRAVENOUS

## 2015-10-03 MED ORDER — BISACODYL 10 MG RE SUPP: 10.0000 mg | Freq: Every day | RECTAL | Status: DC | PRN

## 2015-10-03 MED ORDER — PROPOFOL 10 MG/ML IV BOLUS
INTRAVENOUS | Status: AC
  Filled 2015-10-03: qty 20

## 2015-10-03 MED ORDER — SPIRONOLACTONE 25 MG PO TABS
25.0000 mg | ORAL_TABLET | Freq: Every day | ORAL | Status: DC
  Filled 2015-10-03 (×2): qty 1

## 2015-10-03 MED ORDER — METOCLOPRAMIDE HCL 5 MG/ML IJ SOLN: 5.0000 mg | Freq: Three times a day (TID) | INTRAMUSCULAR | Status: DC | PRN

## 2015-10-03 MED ORDER — ACETAMINOPHEN 500 MG PO TABS
1000.0000 mg | ORAL_TABLET | Freq: Four times a day (QID) | ORAL | Status: AC
  Administered 2015-10-03 – 2015-10-04 (×4): 1000 mg via ORAL
  Filled 2015-10-03 (×4): qty 2

## 2015-10-03 MED ORDER — ALPRAZOLAM 0.5 MG PO TABS
0.5000 mg | ORAL_TABLET | Freq: Every day | ORAL | Status: DC
  Administered 2015-10-03: 0.5 mg via ORAL
  Filled 2015-10-03: qty 1

## 2015-10-03 MED ORDER — METOCLOPRAMIDE HCL 5 MG PO TABS: 5.0000 mg | ORAL_TABLET | Freq: Three times a day (TID) | ORAL | Status: DC | PRN

## 2015-10-03 MED ORDER — ASPIRIN EC 325 MG PO TBEC
325.0000 mg | DELAYED_RELEASE_TABLET | Freq: Every day | ORAL | Status: DC
  Administered 2015-10-03 – 2015-10-04 (×2): 325 mg via ORAL
  Filled 2015-10-03 (×2): qty 1

## 2015-10-03 MED ORDER — DOCUSATE SODIUM 100 MG PO CAPS
100.0000 mg | ORAL_CAPSULE | Freq: Two times a day (BID) | ORAL | Status: DC
  Administered 2015-10-03 – 2015-10-04 (×3): 100 mg via ORAL
  Filled 2015-10-03 (×3): qty 1

## 2015-10-03 MED ORDER — ACETAMINOPHEN 650 MG RE SUPP: 650.0000 mg | Freq: Four times a day (QID) | RECTAL | Status: DC | PRN

## 2015-10-03 MED ORDER — ONDANSETRON HCL 4 MG PO TABS: 4.0000 mg | ORAL_TABLET | Freq: Four times a day (QID) | ORAL | Status: DC | PRN

## 2015-10-03 MED ORDER — LIDOCAINE HCL (CARDIAC) 20 MG/ML IV SOLN
INTRAVENOUS | Status: DC | PRN
  Administered 2015-10-03: 80 mg via INTRAVENOUS

## 2015-10-03 MED ORDER — POLYETHYLENE GLYCOL 3350 17 G PO PACK: 17.0000 g | PACK | Freq: Every day | ORAL | Status: DC | PRN

## 2015-10-03 MED ORDER — BUPIVACAINE-EPINEPHRINE (PF) 0.5% -1:200000 IJ SOLN
INTRAMUSCULAR | Status: DC | PRN
  Administered 2015-10-03: 25 mL via PERINEURAL

## 2015-10-03 SURGICAL SUPPLY — 73 items
AEQUALIS PERFORM ×3 IMPLANT
AUGMENT GLENOID REVISION SM RT (Shoulder) ×1 IMPLANT
BIT DRILL 5/64X5 DISP (BIT) IMPLANT
BLADE SAW SAG 73X25 THK (BLADE) ×2
BLADE SAW SGTL 73X25 THK (BLADE) ×1 IMPLANT
BLADE SURG 15 STRL LF DISP TIS (BLADE) ×1 IMPLANT
BLADE SURG 15 STRL SS (BLADE) ×2
CAP SHLDR PARTIAL 2 ×3 IMPLANT
CEMENT BONE DEPUY (Cement) ×3 IMPLANT
CHLORAPREP W/TINT 26ML (MISCELLANEOUS) ×3 IMPLANT
CLOSURE STERI-STRIP 1/2X4 (GAUZE/BANDAGES/DRESSINGS) ×1
CLOSURE WOUND 1/2 X4 (GAUZE/BANDAGES/DRESSINGS) ×1
CLSR STERI-STRIP ANTIMIC 1/2X4 (GAUZE/BANDAGES/DRESSINGS) ×2 IMPLANT
COVER SURGICAL LIGHT HANDLE (MISCELLANEOUS) ×3 IMPLANT
DRAPE INCISE IOBAN 66X45 STRL (DRAPES) ×3 IMPLANT
DRAPE ORTHO SPLIT 77X108 STRL (DRAPES) ×4
DRAPE SURG 17X23 STRL (DRAPES) ×3 IMPLANT
DRAPE SURG ORHT 6 SPLT 77X108 (DRAPES) ×2 IMPLANT
DRAPE U-SHAPE 47X51 STRL (DRAPES) ×3 IMPLANT
DRESSING AQUACEL AG SP 3.5X10 (GAUZE/BANDAGES/DRESSINGS) ×1 IMPLANT
DRSG AQUACEL AG ADV 3.5X10 (GAUZE/BANDAGES/DRESSINGS) ×3 IMPLANT
DRSG AQUACEL AG SP 3.5X10 (GAUZE/BANDAGES/DRESSINGS) ×3
ELECT BLADE 4.0 EZ CLEAN MEGAD (MISCELLANEOUS)
ELECT REM PT RETURN 9FT ADLT (ELECTROSURGICAL) ×3
ELECTRODE BLDE 4.0 EZ CLN MEGD (MISCELLANEOUS) IMPLANT
ELECTRODE REM PT RTRN 9FT ADLT (ELECTROSURGICAL) ×1 IMPLANT
EVACUATOR 1/8 PVC DRAIN (DRAIN) IMPLANT
GLOVE BIO SURGEON STRL SZ7 (GLOVE) ×3 IMPLANT
GLOVE BIO SURGEON STRL SZ7.5 (GLOVE) ×3 IMPLANT
GLOVE BIOGEL PI IND STRL 7.0 (GLOVE) ×1 IMPLANT
GLOVE BIOGEL PI IND STRL 8 (GLOVE) ×1 IMPLANT
GLOVE BIOGEL PI INDICATOR 7.0 (GLOVE) ×2
GLOVE BIOGEL PI INDICATOR 8 (GLOVE) ×2
GOWN STRL REUS W/ TWL LRG LVL3 (GOWN DISPOSABLE) ×1 IMPLANT
GOWN STRL REUS W/ TWL XL LVL3 (GOWN DISPOSABLE) ×1 IMPLANT
GOWN STRL REUS W/TWL LRG LVL3 (GOWN DISPOSABLE) ×2
GOWN STRL REUS W/TWL XL LVL3 (GOWN DISPOSABLE) ×2
HANDPIECE INTERPULSE COAX TIP (DISPOSABLE) ×2
HEMOSTAT SURGICEL 2X14 (HEMOSTASIS) ×3 IMPLANT
HOOD PEEL AWAY FLYTE STAYCOOL (MISCELLANEOUS) ×6 IMPLANT
KIT BASIN OR (CUSTOM PROCEDURE TRAY) ×3 IMPLANT
KIT ROOM TURNOVER OR (KITS) ×3 IMPLANT
MANIFOLD NEPTUNE II (INSTRUMENTS) ×3 IMPLANT
NEEDLE HYPO 25GX1X1/2 BEV (NEEDLE) IMPLANT
NEEDLE MAYO TROCAR (NEEDLE) ×3 IMPLANT
NS IRRIG 1000ML POUR BTL (IV SOLUTION) ×3 IMPLANT
PACK SHOULDER (CUSTOM PROCEDURE TRAY) ×3 IMPLANT
PAD ARMBOARD 7.5X6 YLW CONV (MISCELLANEOUS) ×6 IMPLANT
RESTRAINT HEAD UNIVERSAL NS (MISCELLANEOUS) ×3 IMPLANT
RETRIEVER SUT HEWSON (MISCELLANEOUS) ×6 IMPLANT
REVISION GLENOID AUGMENT SM RT (Shoulder) ×3 IMPLANT
SET HNDPC FAN SPRY TIP SCT (DISPOSABLE) ×1 IMPLANT
SLING ARM IMMOBILIZER LRG (SOFTGOODS) ×6 IMPLANT
SLING ARM IMMOBILIZER MED (SOFTGOODS) IMPLANT
SMARTMIX MINI TOWER (MISCELLANEOUS) ×3
SPONGE LAP 18X18 X RAY DECT (DISPOSABLE) ×3 IMPLANT
SPONGE LAP 4X18 X RAY DECT (DISPOSABLE) IMPLANT
STRIP CLOSURE SKIN 1/2X4 (GAUZE/BANDAGES/DRESSINGS) ×2 IMPLANT
SUCTION FRAZIER HANDLE 10FR (MISCELLANEOUS) ×2
SUCTION TUBE FRAZIER 10FR DISP (MISCELLANEOUS) ×1 IMPLANT
SUPPORT WRAP ARM LG (MISCELLANEOUS) ×3 IMPLANT
SUT ETHIBOND NAB CT1 #1 30IN (SUTURE) ×9 IMPLANT
SUT MNCRL AB 4-0 PS2 18 (SUTURE) ×3 IMPLANT
SUT SILK 2 0 TIES 17X18 (SUTURE)
SUT SILK 2-0 18XBRD TIE BLK (SUTURE) IMPLANT
SUT VIC AB 2-0 CT1 27 (SUTURE) ×2
SUT VIC AB 2-0 CT1 TAPERPNT 27 (SUTURE) ×1 IMPLANT
SYR CONTROL 10ML LL (SYRINGE) IMPLANT
TAPE LABRALWHITE 1.5X36 (TAPE) ×12 IMPLANT
TAPE SUT LABRALTAP WHT/BLK (SUTURE) ×3 IMPLANT
TOWEL OR 17X24 6PK STRL BLUE (TOWEL DISPOSABLE) ×3 IMPLANT
TOWEL OR 17X26 10 PK STRL BLUE (TOWEL DISPOSABLE) ×3 IMPLANT
TOWER SMARTMIX MINI (MISCELLANEOUS) ×1 IMPLANT
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
## 2015-10-03 NOTE — Anesthesia Procedure Notes (Signed)
Procedure Name: Intubation Date/Time: 10/03/2015 7:48 AM Performed by: Manus Gunning, Sherine Cortese J Pre-anesthesia Checklist: Patient identified, Emergency Drugs available, Suction available, Patient being monitored and Timeout performed Patient Re-evaluated:Patient Re-evaluated prior to inductionOxygen Delivery Method: Circle system utilized Preoxygenation: Pre-oxygenation with 100% oxygen Intubation Type: IV induction Ventilation: Mask ventilation without difficulty Laryngoscope Size: Mac and 4 Grade View: Grade II Tube type: Oral Tube size: 7.5 mm Number of attempts: 1 Placement Confirmation: ETT inserted through vocal cords under direct vision,  positive ETCO2 and breath sounds checked- equal and bilateral Secured at: 21 cm Tube secured with: Tape Dental Injury: Teeth and Oropharynx as per pre-operative assessment

## 2015-10-03 NOTE — Anesthesia Procedure Notes (Addendum)
Anesthesia Regional Block:  Interscalene brachial plexus block  Pre-Anesthetic Checklist: ,, timeout performed, Correct Patient, Correct Site, Correct Laterality, Correct Procedure, Correct Position, site marked, Risks and benefits discussed,  Surgical consent,  Pre-op evaluation,  At surgeon's request and post-op pain management  Laterality: Upper and Right  Prep: Betadine, chloraprep       Needles:  Injection technique: Single-shot  Needle Type: Stimulator Needle - 40      Needle Gauge: 22 and 22 G  Needle insertion depth: 4 cm   Additional Needles:  Procedures: ultrasound guided (picture in chart) and nerve stimulator Interscalene brachial plexus block  Nerve Stimulator or Paresthesia:  Response: Twitch elicited, 0.5 mA, 0.3 ms,   Additional Responses:   Narrative:  Start time: 10/03/2015 7:00 AM End time: 10/03/2015 7:20 AM Injection made incrementally with aspirations every 5 mL.  Performed by: Personally  Anesthesiologist: Amore Grater  Additional Notes: Block assessed prior to start of surgery

## 2015-10-03 NOTE — Progress Notes (Signed)
Report given to jamie rn as caregiver 

## 2015-10-03 NOTE — Discharge Instructions (Signed)

## 2015-10-03 NOTE — Anesthesia Postprocedure Evaluation (Signed)
Anesthesia Post Note  Patient: Travis Howell  Procedure(s) Performed: Procedure(s) (LRB): TOTAL SHOULDER ARTHROPLASTY (Right)  Patient location during evaluation: PACU Anesthesia Type: General and Regional Level of consciousness: awake and alert Pain management: pain level controlled Vital Signs Assessment: post-procedure vital signs reviewed and stable Respiratory status: spontaneous breathing, nonlabored ventilation, respiratory function stable and patient connected to nasal cannula oxygen Cardiovascular status: blood pressure returned to baseline and stable Postop Assessment: no signs of nausea or vomiting Anesthetic complications: no    Last Vitals:  Vitals:   10/03/15 1145 10/03/15 1200  BP: 109/71 106/71  Pulse:  84  Resp:  18  Temp:  36.6 C    Last Pain:  Vitals:   10/03/15 1234  TempSrc:   PainSc: 4                  Pearlean Sabina,JAMES TERRILL

## 2015-10-03 NOTE — Progress Notes (Signed)
  Travis Howell is a 65 y.o. male patient admitted from PACU awake, alert - oriented  X 4 - no acute distress noted.  VSS - Blood pressure 106/71, pulse 84, temperature 97.9 F (36.6 C), temperature source Oral, resp. rate 18, SpO2 97 %.    IV in place, occlusive dsg intact without redness.  Orientation to room, and floor completed with information packet given to patient/family.  Patient declined safety video at this time.  Admission INP armband ID verified with patient/family, and in place.   SR up x 2, fall assessment complete, with patient and family able to verbalize understanding of risk associated with falls, and verbalized understanding to call nsg before up out of bed.  Call light within reach, patient able to voice, and demonstrate understanding.    Left arm in sling. Dressing intact with small old drainage spot. Ice pack applied.     Will cont to eval and treat per MD orders.  Janalyn Shy, RN 10/03/2015 3:04 PM

## 2015-10-03 NOTE — Anesthesia Preprocedure Evaluation (Addendum)
Anesthesia Evaluation  Patient identified by MRN, date of birth, ID band Patient awake    Reviewed: Allergy & Precautions, NPO status , Unable to perform ROS - Chart review only  Airway Mallampati: II  TM Distance: <3 FB Neck ROM: Full    Dental  (+) Teeth Intact, Dental Advisory Given   Pulmonary shortness of breath, former smoker,    breath sounds clear to auscultation       Cardiovascular hypertension, + CAD and + Past MI   Rhythm:Regular Rate:Normal  - Left ventricle: The cavity size was normal. There was mild focal   basal hypertrophy of the septum. Systolic function was normal.   The estimated ejection fraction was in the range of 55% to 60%.   Wall motion was normal; there were no regional wall motion   abnormalities. Features are consistent with a pseudonormal left   ventricular filling pattern, with concomitant abnormal relaxation   and increased filling pressure (grade 2 diastolic dysfunction). - Aorta: Ascending aortic diameter: 38 mm (S). - Ascending aorta: The ascending aorta was mildly dilated. - Right ventricle: The cavity size was mildly dilated. Wall   thickness was normal. - Atrial septum: There was increased thickness of the septum,   consistent with lipomatous hypertrophy.    Neuro/Psych    GI/Hepatic negative GI ROS, Neg liver ROS,   Endo/Other  negative endocrine ROSMorbid obesity  Renal/GU negative Renal ROS     Musculoskeletal  (+) Arthritis ,   Abdominal (+) + obese,   Peds  Hematology   Anesthesia Other Findings   Reproductive/Obstetrics                            Anesthesia Physical Anesthesia Plan  ASA: III  Anesthesia Plan: General   Post-op Pain Management: GA combined w/ Regional for post-op pain   Induction: Intravenous  Airway Management Planned: Oral ETT  Additional Equipment:   Intra-op Plan:   Post-operative Plan: Extubation in  OR  Informed Consent: I have reviewed the patients History and Physical, chart, labs and discussed the procedure including the risks, benefits and alternatives for the proposed anesthesia with the patient or authorized representative who has indicated his/her understanding and acceptance.     Plan Discussed with:   Anesthesia Plan Comments:         Anesthesia Quick Evaluation

## 2015-10-03 NOTE — Op Note (Signed)
Procedure(s): TOTAL SHOULDER ARTHROPLASTY Procedure Note  TRASON LEFAVE male 65 y.o. 10/03/2015  Procedure(s) and Anesthesia Type:    * RIGHT TOTAL SHOULDER ARTHROPLASTY - Choice  Surgeon(s) and Role:    * [name], MD - Primary   Indications:  65 y.o. male  With endstage right shoulder arthritis. Pain and dysfunction interfered with quality of life and nonoperative treatment with activity modification, NSAIDS and injections failed.     Surgeon: Nita Sells   Assistants: Jeanmarie Hubert PA-C [facility] was present and scrubbed throughout the procedure and was essential in positioning, retraction, exposure, and closure)  Anesthesia: General endotracheal anesthesia with preoperative interscalene block given by the attending anesthesiologist    Procedure Detail  TOTAL SHOULDER ARTHROPLASTY  Findings: Tornier flex anatomic press-fit size 4 stem with a 48 x 18 low offset head, cemented size large perform plus augmented 15 Cortiloc glenoid.   A lesser tuberosity osteotomy was performed and repaired at the conclusion of the procedure.  Estimated Blood Loss:  200 mL         Drains: None   Blood Given: none          Specimens: none        Complications:  * No complications entered in OR log *         Disposition: PACU - hemodynamically stable.         Condition: stable    Procedure:   The patient was identified in the preoperative holding area where I personally marked the operative extremity after verifying with the patient and consent. He  was taken to the operating room where He was transferred to the   operative table.  The patient received an interscalene block in   the holding area by the attending anesthesiologist.  General anesthesia was induced   in the operating room without complication.  The patient did receive IV  Ancef prior to the commencement of the procedure.  The patient was   placed in the beach-chair position with the back raised  about 30   degrees.  The nonoperative extremity and head and neck were carefully   positioned and padded protecting against neurovascular compromise.  The   left upper extremity was then prepped and draped in the standard sterile   fashion.    The appropriate operative time-out was performed with   Anesthesia, the perioperative staff, as well as myself and we all agreed   that the right side was the correct operative site.  An approximately   10 cm incision was made from the tip of the coracoid to the center point of the   humerus at the level of the axilla.  Dissection was carried down sharply   through subcutaneous tissues and cephalic vein was identified and taken   laterally with the deltoid.  The pectoralis major was taken medially.  The   upper 1 cm of the pectoralis major was released from its attachment on   the humerus.  The clavipectoral fascia was incised just lateral to the   conjoined tendon.  This incision was carried up to but not into the   coracoacromial ligament.  Digital palpation was used to prove   integrity of the axillary nerve which was protected throughout the   procedure.  Musculocutaneous nerve was not palpated in the operative   field.  Conjoined tendon was then retracted gently medially and the   deltoid laterally.  Anterior circumflex humeral vessels were clamped and   coagulated.  The  soft tissues overlying the biceps was incised and this   incision was carried across the transverse humeral ligament to the base   of the coracoid.  The biceps was tenodesed to the soft tissue just above   pectoralis major and the remaining portion of the biceps superiorly was   excised.  An osteotomy was performed at the lesser tuberosity.  Capsule was then   released all the way down to the 6 o'clock position of the humeral head.   The humeral head was then delivered with simultaneous adduction,   extension and external rotation.  All humeral osteophytes were removed   and  the anatomic neck of the humerus was marked and cut free hand at   approximately 25 degrees retroversion within about 3 mm of the cuff   reflection posteriorly.  The head size was estimated to be a 48 x 18 medium   offset.  At that point, the humeral head was retracted posteriorly with   a Fukuda retractor.   Remaining portion of the capsule was released at the base of the   coracoid.  The remaining biceps anchor and the entire anterior-inferior   labrum was excised.  The posterior labrum was also excised but the   posterior capsule was not released.  The guidepin was placed bicortically with the perform plus elevated guide.  The reamer was used to ream the anterior half of the glenoid to punctate bleeding.  The special angled posterior reamer was then used over the guidepin to ream to a 15 implant. Trial was placed and this was felt to be the appropriate amount of correction. The center hole was then drilled for an anchor peg glenoid followed by the three peripheral holes and none of the holes   exited the glenoid wall. Again the trial was placed and felt an excellent fit, correcting the posterior bone loss I then pulse irrigated these holes and dried   them with Surgicel.  The three peripheral holes were then   pressurized cemented and the anchor peg glenoid was placed and impacted   with an excellent fit.  The glenoid was a large perform + 15 component.  The proximal humerus was then again exposed taking care not to displace the glenoid.    The entry awl was used followed by sounding reamers and then sequentially broached from size 2 to 4. This was then left in place and the calcar planer was used. Trial head was placed with a 48 x 18 low offset.  With the trial implantation of the component, there was approximately 50% posterior translation with immediate snap back to the   anatomic position.  With forward elevation, there was no tendency   towards posterior subluxation.   The trial was removed  and the final implant was prepared on a back table.  The trial was removed and the final implant was prepared on a back table.   3 small holes were drilled on the medial side of the lesser tuberosity osteotomy, through which 2 labral tapes were passed. The implant was then placed through the loop of the 2 labral tapes and impacted with an excellent press-fit. This achieved excellent anatomic reconstruction of the proximal humerus.  The joint was then copiously irrigated with pulse lavage.  The subscapularis and   lesser tuberosity osteotomy were then repaired using the 2 labral tapes previously passed in a double row fashion with horizontal mattress sutures medially brought over through bone tunnels tied over a bone bridge laterally.  One #1 Ethibond was placed at the rotator interval just above   the lesser tuberosity. Copious irrigation was used. Skin was closed with 2-0 Vicryl sutures in the deep dermal layer and 4-0 Monocryl in a subcuticular  running fashion.  Sterile dressings were then applied including Aquacel.  The patient was placed in a sling and allowed to awaken from general anesthesia and taken to the recovery room in stable condition.      POSTOPERATIVE PLAN:  Early passive range of motion will be allowed with the goal of 0 degrees external rotation and 90 degrees forward elevation.  No internal rotation at this time.  No active motion of the arm until the lesser tuberosity heals.  The patient will likely be kept in the hospital for 1-2 days and then discharged home.

## 2015-10-03 NOTE — Transfer of Care (Signed)
Immediate Anesthesia Transfer of Care Note  Patient: [name]  Procedure(s) Performed: Procedure(s): TOTAL SHOULDER ARTHROPLASTY (Right)  Patient Location: PACU  Anesthesia Type:General  Level of Consciousness: awake  Airway & Oxygen Therapy: Patient Spontanous Breathing and Patient connected to nasal cannula oxygen  Post-op Assessment: Report given to RN and Post -op Vital signs reviewed and stable  Post vital signs: Reviewed and stable  Last Vitals:  Vitals:   10/03/15 0621 10/03/15 1007  BP: 100/63 (P) 130/72  Pulse: 73 100  Resp: 20 19  Temp: 37.4 C 36.7 C    Last Pain:  Vitals:   10/03/15 1007  TempSrc:   PainSc: (P) 0-No pain      Patients Stated Pain Goal: 1 ([id number])  Complications: No apparent anesthesia complications

## 2015-10-04 ENCOUNTER — Encounter: Payer: Self-pay | Admitting: Family Medicine

## 2015-10-04 LAB — CBC
HEMATOCRIT: 32.8 % — AB (ref 39.0–52.0)
HEMOGLOBIN: 11.2 g/dL — AB (ref 13.0–17.0)
MCH: 33.3 pg (ref 26.0–34.0)
MCHC: 34.1 g/dL (ref 30.0–36.0)
MCV: 97.6 fL (ref 78.0–100.0)
Platelets: 141 10*3/uL — ABNORMAL LOW (ref 150–400)
RBC: 3.36 MIL/uL — ABNORMAL LOW (ref 4.22–5.81)
RDW: 12.3 % (ref 11.5–15.5)
WBC: 8.7 10*3/uL (ref 4.0–10.5)

## 2015-10-04 MED ORDER — DOCUSATE SODIUM 100 MG PO CAPS: 100.0000 mg | ORAL_CAPSULE | Freq: Three times a day (TID) | ORAL | 0 refills | Status: DC | PRN

## 2015-10-04 MED ORDER — OXYCODONE-ACETAMINOPHEN 5-325 MG PO TABS: 1.0000 | ORAL_TABLET | ORAL | 0 refills | Status: DC | PRN
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
## 2015-10-04 NOTE — Progress Notes (Signed)
Discharge instruction provided to pt and all questions answered. Pt is ready to discharge.

## 2015-10-04 NOTE — Progress Notes (Signed)
   PATIENT ID: Travis Howell   1 Day Post-Op Procedure(s) (LRB): TOTAL SHOULDER ARTHROPLASTY (Right)  Subjective: Doing well, no pain. Feels great.   Objective:  Vitals:   10/03/15 2357 10/04/15 0436  BP: 108/68 (!) 97/58  Pulse: 91 84  Resp: 16 16  Temp: 99.6 F (37.6 C) 98.4 F (36.9 C)     Right shoulder dressing c/d/i Wiggles fingers, distally NVI  Labs:   Recent Labs  10/04/15 0532  HGB 11.2*   Recent Labs  10/04/15 0532  WBC 8.7  RBC 3.36*  HCT 32.8*  PLT 141*  No results for input(s): NA, K, CL, CO2, BUN, CREATININE, GLUCOSE, CALCIUM in the last 72 hours.  Assessment and Plan: 1 day s/p R TSA Hypotensive- asymptomatic and seems like his baseline OT- FF and ER PROM limited to 90 and neutral Sling D/c home today with cleared by OT Fu with Dr. [name] in 2 weeks Scripts in chart  VTE proph: ASA 325mg  daily, SCDs

## 2015-10-04 NOTE — Evaluation (Signed)
Occupational Therapy Evaluation and Discharge Patient Details Name: Travis Howell MRN: [id number] DOB: May 12, 1950 Today's Date: 10/04/2015    History of Present Illness Pt is a 65 y/o presenting post op for total shoulder arthroplasty. History of anxiety, drepression, bone implant in wrist, shortness of breath with activity   Clinical Impression   Pt is pleasant and willing to work with therapy. PTA Pt independent in ADL and IADL. Pt mobilizing and able to perform sink level ADL. Pt safe to d/c home with significant other that will assist with bilateral ADL, but Pt motivated to re-gain independence. Pt demonstrated exercises and don/doff of sling x2. Pt provided with OT shoulder protocol worksheet. No further concerns or questions from Pt. No further OT needs, with MD to progress rehab of the shoulder at follow up. OT to sign off.     Follow Up Recommendations  Other (comment);Supervision - Intermittent (MD to determine rehab of shoulder at follow up)    Equipment Recommendations  None recommended by OT    Recommendations for Other Services       Precautions / Restrictions Precautions Precautions: Shoulder;Fall Type of Shoulder Precautions: Passive Shoulder Interventions: Shoulder sling/immobilizer;Off for dressing/bathing/exercises;At all times Precaution Booklet Issued: Yes (comment) Precaution Comments: OT shoulder protocol with specifics for Pt filled in Required Braces or Orthoses: Sling Restrictions Weight Bearing Restrictions: Yes RUE Weight Bearing: Non weight bearing      Mobility Bed Mobility Overal bed mobility: Modified Independent                Transfers Overall transfer level: Modified independent                    Balance Overall balance assessment: Modified Independent                                          ADL Overall ADL's : Needs assistance/impaired Eating/Feeding: Modified independent;Sitting   Grooming:  Wash/dry face;Supervision/safety;Standing   Upper Body Bathing: Maximal assistance;Standing Upper Body Bathing Details (indicate cue type and reason): Pt educated on positioing of RUE for showering, and how to clean armpit Lower Body Bathing: Supervison/ safety   Upper Body Dressing : Moderate assistance Upper Body Dressing Details (indicate cue type and reason): practiced don/doff of sling x2 advised on button up shirt for ease of dressing Lower Body Dressing: Minimal assistance;With caregiver independent assisting;Sit to/from stand Lower Body Dressing Details (indicate cue type and reason): significant other assisted in getting on underwear             Functional mobility during ADLs: Modified independent General ADL Comments: Pt motivated to be independent in ADL while maintaining NWB status, and will have assist of significant other upon d/c. Pt provided with worksheet with covers sling use, position for sleeping, showering, dressing, and exercises with personalized information     Vision     Perception     Praxis      Pertinent Vitals/Pain Pain Assessment: 0-10 Pain Score: 1  Pain Location: R shoulder Pain Descriptors / Indicators: Sore;Aching Pain Intervention(s): Monitored during session;Repositioned;Ice applied     Hand Dominance Right   Extremity/Trunk Assessment Upper Extremity Assessment Upper Extremity Assessment: RUE deficits/detail RUE Deficits / Details: post-op deficits in ROM and strength  RUE: Unable to fully assess due to immobilization RUE Coordination: decreased gross motor   Lower Extremity Assessment Lower Extremity Assessment: Overall [facility]  for tasks assessed       Communication Communication Communication: No difficulties   Cognition Arousal/Alertness: Awake/alert Behavior During Therapy: WFL for tasks assessed/performed Overall Cognitive Status: Within Functional Limits for tasks assessed                     General Comments        Exercises Exercises: Shoulder     Shoulder Instructions Shoulder Instructions Donning/doffing shirt without moving shoulder: Maximal assistance;Patient able to independently direct caregiver Method for sponge bathing under operated UE: Supervision/safety Donning/doffing sling/immobilizer: Maximal assistance;Patient able to independently direct caregiver Correct positioning of sling/immobilizer: Modified independent ROM for elbow, wrist and digits of operated UE: Modified independent Sling wearing schedule (on at all times/off for ADL's): Modified independent Proper positioning of operated UE when showering: Modified independent Dressing change: Maximal assistance;Patient able to independently direct caregiver Positioning of UE while sleeping: Minimal assistance;Patient able to independently direct caregiver (pt educated on position of pillow for support of shoulder)    Home Living Family/patient expects to be discharged to:: Private residence Living Arrangements: Spouse/significant other;Other relatives (grandson) Available Help at Discharge: Family;Available 24 hours/day Type of Home: Mobile home Home Access: Stairs to enter Entrance Stairs-Number of Steps: 5 Entrance Stairs-Rails: None Home Layout: One level     Bathroom Shower/Tub: Occupational psychologist: Standard Bathroom Accessibility: Yes How Accessible: Accessible via walker Home Equipment: Hand held shower head          Prior Functioning/Environment Level of Independence: Independent                 OT Problem List: Decreased strength;Decreased range of motion;Impaired UE functional use;Pain   OT Treatment/Interventions:      OT Goals(Current goals can be found in the care plan section) Acute Rehab OT Goals Patient Stated Goal: To get strong and get back to work OT Goal Formulation: With patient Time For Goal Achievement: 10/11/15 Potential to Achieve Goals: Good  OT Frequency:      Barriers to D/C:            Co-evaluation              End of Session Nurse Communication: Mobility status  Activity Tolerance: Patient tolerated treatment well Patient left: in chair;with call bell/phone within reach   Time: 0826-0859 OT Time Calculation (min): 33 min Charges:  OT General Charges $OT Visit: 1 Procedure OT Evaluation $OT Eval Low Complexity: 1 Procedure OT Treatments $Self Care/Home Management : 8-22 mins G-Codes:    Merri Ray Kymoni Lesperance October 31, 2015, 9:20 AM  Hulda Humphrey OTR/L [phone]

## 2015-10-07 ENCOUNTER — Ambulatory Visit (INDEPENDENT_AMBULATORY_CARE_PROVIDER_SITE_OTHER): Payer: Medicare Other | Admitting: Family Medicine

## 2015-10-07 ENCOUNTER — Encounter: Payer: Self-pay | Admitting: Family Medicine

## 2015-10-07 VITALS — BP 118/74 | HR 76 | Temp 98.2°F | Wt 220.2 lb

## 2015-10-07 DIAGNOSIS — M19019 Primary osteoarthritis, unspecified shoulder: Secondary | ICD-10-CM

## 2015-10-07 DIAGNOSIS — I2510 Atherosclerotic heart disease of native coronary artery without angina pectoris: Secondary | ICD-10-CM

## 2015-10-07 DIAGNOSIS — E785 Hyperlipidemia, unspecified: Secondary | ICD-10-CM

## 2015-10-07 DIAGNOSIS — F418 Other specified anxiety disorders: Secondary | ICD-10-CM

## 2015-10-07 DIAGNOSIS — N138 Other obstructive and reflux uropathy: Secondary | ICD-10-CM

## 2015-10-07 DIAGNOSIS — I10 Essential (primary) hypertension: Secondary | ICD-10-CM | POA: Diagnosis not present

## 2015-10-07 DIAGNOSIS — Z7189 Other specified counseling: Secondary | ICD-10-CM | POA: Insufficient documentation

## 2015-10-07 DIAGNOSIS — N401 Enlarged prostate with lower urinary tract symptoms: Secondary | ICD-10-CM

## 2015-10-07 MED ORDER — FLUOXETINE HCL 10 MG PO CAPS: ORAL_CAPSULE | ORAL | 10 refills | Status: DC

## 2015-10-07 NOTE — Assessment & Plan Note (Signed)
Chronic, stable. Continue current regimen. 

## 2015-10-07 NOTE — Assessment & Plan Note (Addendum)
Pt endorses normal DRE 01/2015.  PSA reassuring. Will defer DRE at this time.  Continue rapaflo 8mg  daily.

## 2015-10-07 NOTE — Progress Notes (Signed)
Pre visit review using our clinic review tool, if applicable. No additional management support is needed unless otherwise documented below in the visit note. 

## 2015-10-07 NOTE — Assessment & Plan Note (Signed)
Advised check with cards re antiplatelet recommendations. Currently taking aspirin 81mg  bid.

## 2015-10-07 NOTE — Assessment & Plan Note (Signed)
Now GF endorses h/o ?paranoia and schizophrenia. Unclear if true dx. Discussed SSRI alone will not treat true schizophrenia. They desire to continue prozac 10mg  daily and will update me if any psychosis sxs.

## 2015-10-07 NOTE — Patient Instructions (Addendum)
Check with Dr Irish Lack over the next few weeks to see if you need to be on any other medicine.  You are doing well today. Return as needed or in 6 months for follow up visit.

## 2015-10-07 NOTE — Assessment & Plan Note (Signed)
Chronic, stable. Continue atorvastatin 20mg  daily and fish oil.

## 2015-10-07 NOTE — Progress Notes (Signed)
BP 118/74   Pulse 76   Temp 98.2 F (36.8 C) (Oral)   Wt 220 lb 4 oz (99.9 kg)   BMI 34.24 kg/m    CC: f/u AMW Subjective:    Patient ID: Travis Howell, male    DOB: 07/18/1950, 65 y.o.   MRN: [id number]  HPI: TRAYVEON HAGEMEIER is a 65 y.o. male presenting on 10/07/2015 for Annual Exam   Saw Katha Cabal last week for medicare wellness visit. Note reviewed. Recent R total shoulder replacement by Dr [name].   Prior PCP records reviewed, colonoscopy report and immunization records absent.   CAD - established with local cardiologist Dr Irish Lack. Statin changed to atorvastatin 20mg  daily. Considering starting plavix after surgery. He's currently only taking aspirin 81mg  twice daily.   MDD- GF states pt has been dx with paranoid schizophrenia. GF noticed increased irritability when of prozac. Desires to continue this. Initial med by psychiatrist in [facility] was ineffective, prozac has worked better.   Preventative: Colon cancer screening - thinks 02/2012 with polyps, rec rpt 5 yrs.  Prostate cancer screening - on rapaflo. Last DRE was 01/2015 and normal per patient.  Lung cancer screening - quit smoking 2011, 1 pack per week.  Flu shot - yearly Tetanus shot - 5 yrs ago Pneumonia shot - thinks done in [place].  Shingles shot - thinks 2014 Advanced directive discussion - has at home. HCPOA would be GF.  Seat belt use discussed Sunscreen use and skin screen discussed Ex smoker - quit 2011 No significant alcohol.  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 Interim medical history since our last visit reviewed. Allergies and medications reviewed and updated. Current Outpatient Prescriptions on File Prior to Visit  Medication Sig  . ALPRAZolam (XANAX) 0.5 MG tablet Take 0.5 mg by mouth at  bedtime. One at bedtime and then one daily as needed.   Marland Kitchen amLODipine-valsartan (EXFORGE) 5-160 MG tablet Take 0.5 tablets by mouth daily.  Marland Kitchen aspirin 81 MG chewable tablet Chew 81 mg by mouth 2 (two) times daily.  Marland Kitchen atorvastatin (LIPITOR) 20 MG tablet Take 1 tablet (20 mg total) by mouth daily.  Marland Kitchen docusate sodium (COLACE) 100 MG capsule Take 1 capsule (100 mg total) by mouth 3 (three) times daily as needed.  Marland Kitchen eplerenone (INSPRA) 25 MG tablet Take 25 mg by mouth daily.  . Omega-3 Fatty Acids (FISH OIL) 1000 MG CAPS Take 4,000 mg by mouth daily.  Marland Kitchen oxyCODONE-acetaminophen (ROXICET) 5-325 MG tablet Take 1-2 tablets by mouth every 4 (four) hours as needed for severe pain.  Marland Kitchen Potassium 99 MG TABS Take 1 tablet by mouth 2 (two) times daily.  . silodosin (RAPAFLO) 8 MG CAPS capsule Take 8 mg by mouth daily with breakfast.   No current facility-administered medications on file prior to visit.     Review of Systems Per HPI unless specifically indicated in ROS section     Objective:    BP 118/74   Pulse 76   Temp 98.2 F (36.8 C) (Oral)   Wt 220 lb 4 oz (99.9 kg)   BMI 34.24 kg/m   Wt Readings from Last 3 Encounters:  10/07/15 220 lb 4 oz (99.9 kg)  10/01/15 221 lb 8 oz (100.5 kg)  09/26/15 224 lb 2 oz (101.7 kg)    Physical Exam  Constitutional: He appears well-developed and well-nourished. No distress.  HENT:  Mouth/Throat: Oropharynx is clear and moist. No oropharyngeal exudate.  Eyes: Conjunctivae and EOM are normal. Pupils are equal, round, and reactive to light.  Cardiovascular: Normal rate, regular rhythm, normal heart sounds and intact distal pulses.   No murmur heard. Pulmonary/Chest: Effort normal and breath sounds normal. No respiratory distress. He has no wheezes. He has no rales.  Musculoskeletal: He exhibits no edema.  R arm in sling  Skin: Skin is warm and dry. No rash noted.  Psychiatric: He has a normal mood and affect.  Nursing note and vitals reviewed.  Results  for orders placed or performed during the hospital encounter of 10/03/15  CBC  Result Value Ref Range   WBC 8.7 4.0 - 10.5 K/uL   RBC 3.36 (L) 4.22 - 5.81 MIL/uL   Hemoglobin 11.2 (L) 13.0 - 17.0 g/dL   HCT 32.8 (L) 39.0 - 52.0 %   MCV 97.6 78.0 - 100.0 fL   MCH 33.3 26.0 - 34.0 pg   MCHC 34.1 30.0 - 36.0 g/dL   RDW 12.3 11.5 - 15.5 %   Platelets 141 (L) 150 - 400 K/uL      Assessment & Plan:   Problem List Items Addressed This Visit    Advanced care planning/counseling discussion    Advanced directive discussion - has at home. HCPOA would be GF.       Benign prostatic hyperplasia with urinary obstruction    Pt endorses normal DRE 01/2015.  PSA reassuring. Will defer DRE at this time.  Continue rapaflo 8mg  daily.      CAD (coronary artery disease)    Advised check with cards re antiplatelet recommendations. Currently taking aspirin 81mg  bid.       Depression with anxiety    Now GF endorses h/o ?paranoia and schizophrenia. Unclear if true dx. Discussed SSRI alone will not treat true schizophrenia. They desire to continue prozac 10mg  daily and will update me if any psychosis sxs.       HLD (hyperlipidemia)    Chronic, stable. Continue atorvastatin 20mg  daily and fish oil.      HTN (hypertension) - Primary    Chronic, stable. Continue current regimen.       Shoulder arthritis    Other Visit Diagnoses   None.      Follow up plan: Return in about 6 months (around 04/06/2016) for follow up visit.  [name], MD

## 2015-10-07 NOTE — Assessment & Plan Note (Signed)
Advanced directive discussion - has at home. HCPOA would be GF.

## 2015-10-08 ENCOUNTER — Encounter (HOSPITAL_COMMUNITY): Payer: Self-pay | Admitting: Orthopedic Surgery

## 2015-10-12 NOTE — Addendum Note (Signed)
Addendum  created 10/12/15 0954 by Rica Koyanagi, MD   Anesthesia Intra Blocks edited, Sign clinical note

## 2015-10-18 DIAGNOSIS — M19011 Primary osteoarthritis, right shoulder: Secondary | ICD-10-CM | POA: Diagnosis not present

## 2015-11-05 ENCOUNTER — Telehealth: Payer: Self-pay | Admitting: Interventional Cardiology

## 2015-11-05 NOTE — Telephone Encounter (Signed)
New message       Pt c/o medication issue:  1. Name of Medication: atorvastatin 2. How are you currently taking this medication (dosage and times per day)? 20mg  daily  3. Are you having a reaction (difficulty breathing--STAT)?  Possible allergic reaction 4. What is your medication issue? While pt was on medication, he c/o itchy eyes, rash on face and chest and itchy all over at times.  Pt stopped medication 3 weeks ago and now all of the symptoms have disappeared. Also, should pt cancel his nov 13th lab appt?

## 2015-11-06 NOTE — Telephone Encounter (Signed)
**Note De-Identified Ezana Hubbert Obfuscation** [name] is advised that I is ok that the pt stopped taking his Atorvastatin due to his side effects and that he does not need to have his labs drawn since he is not taking Atorvastatin . She is aware that I am sending this message to Dr Irish Lack and that I will call them back with his recommendations. She verbalized understanding.   Please advise.

## 2015-11-08 MED ORDER — EZETIMIBE 10 MG PO TABS: 10.0000 mg | ORAL_TABLET | Freq: Every day | ORAL | 6 refills | Status: DC

## 2015-11-08 NOTE — Telephone Encounter (Signed)
Spoke with wife and informed her of recommendations per [facility].  Wife verbalized understanding and was in agreement with this plan.  Wife did not want to schedule labs at this time.  Recall placed in system for labs.

## 2015-11-08 NOTE — Telephone Encounter (Signed)
Given h/o MI, can we start rosuvastatin 10 mg daily.

## 2015-11-08 NOTE — Telephone Encounter (Signed)
Yes - would also recommend challenge with rosuvastatin and advise pt to call if she develops any symptoms similar to when she took atorvastatin.

## 2015-11-08 NOTE — Telephone Encounter (Signed)
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  She says this was the first statin he tried and had the same problems.  Spoke with her briefly about [facility] and she states that she has actually had to do this in the past so she is familiar with it and knows it is expensive.  Advised I would send message to Dr. Irish Lack for recommendations and to [facility] to see if pt would qualify for injectable statin meds.

## 2015-11-08 NOTE — Telephone Encounter (Signed)
Pt will need a trial of Zetia 10mg  daily before his insurance would cover [id number]. Would recommend rechecking lipid panel in 3 months and can reassess at that time.

## 2015-11-13 DIAGNOSIS — M19011 Primary osteoarthritis, right shoulder: Secondary | ICD-10-CM | POA: Diagnosis not present

## 2015-11-14 DIAGNOSIS — M25611 Stiffness of right shoulder, not elsewhere classified: Secondary | ICD-10-CM | POA: Diagnosis not present

## 2015-11-14 DIAGNOSIS — Z96611 Presence of right artificial shoulder joint: Secondary | ICD-10-CM | POA: Diagnosis not present

## 2015-11-18 ENCOUNTER — Other Ambulatory Visit: Payer: Medicare Other

## 2015-11-18 DIAGNOSIS — Z96611 Presence of right artificial shoulder joint: Secondary | ICD-10-CM | POA: Diagnosis not present

## 2015-11-18 DIAGNOSIS — M25611 Stiffness of right shoulder, not elsewhere classified: Secondary | ICD-10-CM | POA: Diagnosis not present

## 2015-11-21 DIAGNOSIS — Z96611 Presence of right artificial shoulder joint: Secondary | ICD-10-CM | POA: Diagnosis not present

## 2015-11-21 DIAGNOSIS — M25611 Stiffness of right shoulder, not elsewhere classified: Secondary | ICD-10-CM | POA: Diagnosis not present

## 2015-11-26 DIAGNOSIS — M25611 Stiffness of right shoulder, not elsewhere classified: Secondary | ICD-10-CM | POA: Diagnosis not present

## 2015-11-26 DIAGNOSIS — Z96611 Presence of right artificial shoulder joint: Secondary | ICD-10-CM | POA: Diagnosis not present

## 2015-11-27 DIAGNOSIS — M25611 Stiffness of right shoulder, not elsewhere classified: Secondary | ICD-10-CM | POA: Diagnosis not present

## 2015-11-27 DIAGNOSIS — Z96611 Presence of right artificial shoulder joint: Secondary | ICD-10-CM | POA: Diagnosis not present

## 2015-12-02 DIAGNOSIS — Z96611 Presence of right artificial shoulder joint: Secondary | ICD-10-CM | POA: Diagnosis not present

## 2015-12-02 DIAGNOSIS — M25611 Stiffness of right shoulder, not elsewhere classified: Secondary | ICD-10-CM | POA: Diagnosis not present

## 2015-12-05 DIAGNOSIS — Z96611 Presence of right artificial shoulder joint: Secondary | ICD-10-CM | POA: Diagnosis not present

## 2015-12-05 DIAGNOSIS — M25611 Stiffness of right shoulder, not elsewhere classified: Secondary | ICD-10-CM | POA: Diagnosis not present

## 2015-12-09 ENCOUNTER — Encounter (HOSPITAL_COMMUNITY): Payer: Self-pay | Admitting: *Deleted

## 2015-12-09 ENCOUNTER — Ambulatory Visit (HOSPITAL_COMMUNITY)
Admission: EM | Admit: 2015-12-09 | Discharge: 2015-12-09 | Disposition: A | Discharge status: Home / Self Care | Payer: Medicare Other | Attending: Family Medicine | Admitting: Family Medicine

## 2015-12-09 ENCOUNTER — Telehealth: Payer: Self-pay | Admitting: Family Medicine

## 2015-12-09 DIAGNOSIS — J40 Bronchitis, not specified as acute or chronic: Secondary | ICD-10-CM | POA: Diagnosis not present

## 2015-12-09 MED ORDER — BENZONATATE 100 MG PO CAPS: 200.0000 mg | ORAL_CAPSULE | Freq: Three times a day (TID) | ORAL | 0 refills | Status: DC | PRN

## 2015-12-09 MED ORDER — METHYLPREDNISOLONE 4 MG PO TBPK: ORAL_TABLET | ORAL | 0 refills | Status: DC

## 2015-12-09 MED ORDER — AZITHROMYCIN 250 MG PO TABS: 250.0000 mg | ORAL_TABLET | Freq: Every day | ORAL | 0 refills | Status: DC

## 2015-12-09 NOTE — ED Triage Notes (Signed)
Pt  Reports   Symptoms  Of  Cough  /   Congestion     With  Sinus drainage       And congestion  With  Symptoms    X  sev  Weeks     Pt  Has  Been  Attempting  otc  meds  Without  releif
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
## 2015-12-09 NOTE — Telephone Encounter (Signed)
Patient Name: Travis Howell  DOB: 1950-04-04    Initial Comment Caller says, bad congestion in head and chest, productive cough, nasal drainage    Nurse Assessment  Nurse: Verlin Fester RN, Stanton Kidney Date/Time (Eastern Time): 12/09/2015 9:30:09 AM  Confirm and document reason for call. If symptomatic, describe symptoms. ---Spouse states patient has bad congestion in head and chest, productive cough, nasal drainage  Does the patient have any new or worsening symptoms? ---Yes  Will a triage be completed? ---Yes  Related visit to physician within the last 2 weeks? ---No  Does the PT have any chronic conditions? (i.e. diabetes, asthma, etc.) ---No  Is this a behavioral health or substance abuse call? ---No     Guidelines    Guideline Title Affirmed Question Affirmed Notes  Cough - Acute Productive Cough with cold symptoms (e.g., runny nose, postnasal drip, throat clearing) (all triage questions negative)    Final Disposition User   [place], RN, [facility]    Disagree/Comply: Comply

## 2015-12-09 NOTE — Telephone Encounter (Signed)
See below patient triage note from team health.  Instructed to treat at home with below recommendations and call back if symptoms worsen or don't improve.      Patient Name: Travis Howell Gender: Male DOB: 1950-01-21 Age: 65 Y 2 M 11 D Return Phone Number: [phone] (Primary), [phone] (Secondary) [place] [address] Client [name] - Client Client Site [name] - Day Physician [name] - MD Contact Type Call Who Is Calling Patient / Member / Family / Caregiver Call Type Triage / Clinical Caller Name Merleen Nicely Relationship To Patient Spouse Return Phone Number [phone] (Primary) Chief Complaint Cough Reason for Call Symptomatic / Request for [name] says, bad congestion in head and chest, productive cough, nasal drainage Appointment Disposition EMR Appointment Not Necessary Info pasted into Epic Yes PreDisposition Call Doctor Translation No Nurse Assessment Nurse: Verlin Fester, RN, [facility] Kidney Date/Time Eilene Ghazi Time): 12/09/2015 9:30:09 AM Confirm and document reason for call. If symptomatic, describe symptoms. ---Spouse states patient has bad congestion in head and chest, productive cough, nasal drainage Does the patient have any new or worsening symptoms? ---Yes Will a triage be completed? ---Yes Related visit to physician within the last 2 weeks? ---No Does the PT have any chronic conditions? (i.e. diabetes, asthma, etc.) ---No Is this a behavioral health or substance abuse call? ---No Guidelines Guideline Title Affirmed Question Affirmed Notes Nurse Date/Time (Eastern Time) Cough - Acute Productive Cough with cold symptoms (e.g., runny nose, postnasal drip, throat clearing) (all triage questions negative) Verlin Fester, RN, [facility] 12/09/2015 9:30:33 AM Disp. Time Eilene Ghazi Time) Disposition Final User 12/09/2015 9:36:48 AM Home Care Yes Noe, RN,  Stanton Kidney PLEASE NOTE: All timestamps contained within this report are represented as [place] Standard Time. CONFIDENTIALTY NOTICE: This fax transmission is intended only for the addressee. It contains information that is legally privileged, confidential or otherwise protected from use or disclosure. If you are not the intended recipient, you are strictly prohibited from reviewing, disclosing, copying using or disseminating any of this information or taking any action in reliance on or regarding this information. If you have received this fax in error, please notify us immediately by telephone so that we can arrange for its return to [name]. Phone: [phone], Toll-Free: [phone], Fax: [phone] Page: 2 of 2 Call Id: [id number] Caller Understands: Yes Disagree/Comply: Comply Care Advice Given Per Guideline HOME CARE: You should be able to treat this at home. REASSURANCE: * It sounds like an uncomplicated cold that we can treat at home. * Colds are very common and may make you feel uncomfortable. * Colds are caused by viruses, and no medicine or 'shot' will cure an uncomplicated cold. Colds are usually not serious. * Coughing up mucus is very important for protecting the lungs from pneumonia. COUGH MEDICINES: - OTC COUGH SYRUPS: The most common cough suppressant in OTC cough medications is dextromethorphan. Often the letters 'DM' appear in the name. - OTC COUGH DROPS: Cough drops can help a lot, especially for mild coughs. They reduce coughing by soothing your irritated throat and removing that tickle sensation in the back of the throat. Cough drops also have the advantage of portability - you can carry them with you. - HOME REMEDY - HARD CANDY: Hard candy works just as well as medicine-flavored OTC cough drops. Diabetics should use sugar-free candy. - HOME REMEDY - HONEY: This old home remedy has been shown to help decrease coughing at night. The adult dosage is 2  teaspoons (10 ml) at bedtime. Honey  should not be given to infants under one year of age. FOR A RUNNY NOSE - BLOW YOUR NOSE: * Blowing your nose helps clean out your nose. Use a handkerchief or a paper tissue. * Nasal mucus and discharge help wash viruses and bacteria out of the nose and sinuses. FOR A STUFFY NOSE - USE NASAL WASHES: * Introduction: Saline (salt water) nasal irrigation (nasal wash) is an effective and simple home remedy for treating stuffy nose and sinus congestion. The nose can be irrigated by pouring, spraying, or squirting salt water into the nose and then letting it run back out. * How it Helps: The salt water rinses out excess mucus, washes out any irritants (dust, allergens) that might be present, and moistens the nasal cavity. * Methods: There are several ways to perform nasal irrigation. You can use a saline nasal spray bottle (available over-the-counter), a rubber ear syringe, a medical syringe without the needle, or a NETI POT. NASAL DECONGESTANTS FOR A VERY STUFFY NOSE: * If you have a very stuffy nose, nasal decongestant medicines can shrink the swollen nasal mucosa and allow for easier breathing. If you have a very runny nose, these medicines can reduce the amount of drainage. They may be taken as pills by mouth or as a nasal spray. * Most people do NOT need to use these medicines. If your nose feels blocked, you should try using nasal washes first. * PSEUDOEPHEDRINE (Sudafed) is available OTC in pill form. Typical adult dosage is two 30 mg tablets every 6 hours. * PHENYLEPHRINE (Sudafed PE) is available OTC in pill form. Typical adult dosage is one 10 mg tablet every 4 hours. * OXYMETAZOLINE NASAL DROPS (Afrin) are available OTC. Clean out the nose before using. Spray each nostril once, wait one minute for absorption, and then spray a second time. * PHENYLEPHRINE NASAL DROPS (Neo-Synephrine) are available OTC. Clean out the nose before using. Spray each nostril once, wait one minute for absorption, and  then spray a second time. * Read the package instructions on all medicines that you take. FEVER MEDICINES: * For fever relief, take acetaminophen or ibuprofen. * Treat fevers above 101 F (38.3 C). * The goal of fever therapy is to bring the fever down to a comfortable level. Remember that fever medicine usually lowers fever 2-3 F (1-1.5 C). ACETAMINOPHEN (E.G., TYLENOL): IBUPROFEN (E.G., MOTRIN, ADVIL): CONTAGIOUSNESS: * The cold virus is present in your nasal secretions. * Cover your nose and mouth with a tissue when you sneeze or cough. Wash your hands frequently. * You can return to work or school after the fever is gone and you feel well enough to participate in normal activities. CALL BACK IF: * Fever lasts over 3 days * Nasal discharge lasts over 10 days * Earache or facial pain develops * You become worse. CARE ADVICE given per Cough - Acute Productive (Adult) guideline.

## 2015-12-11 DIAGNOSIS — M25611 Stiffness of right shoulder, not elsewhere classified: Secondary | ICD-10-CM | POA: Diagnosis not present

## 2015-12-11 DIAGNOSIS — Z96611 Presence of right artificial shoulder joint: Secondary | ICD-10-CM | POA: Diagnosis not present

## 2015-12-18 DIAGNOSIS — M25611 Stiffness of right shoulder, not elsewhere classified: Secondary | ICD-10-CM | POA: Diagnosis not present

## 2015-12-18 DIAGNOSIS — Z96611 Presence of right artificial shoulder joint: Secondary | ICD-10-CM | POA: Diagnosis not present

## 2015-12-25 DIAGNOSIS — Z471 Aftercare following joint replacement surgery: Secondary | ICD-10-CM | POA: Diagnosis not present

## 2015-12-25 DIAGNOSIS — Z96611 Presence of right artificial shoulder joint: Secondary | ICD-10-CM | POA: Diagnosis not present

## 2015-12-25 DIAGNOSIS — M25611 Stiffness of right shoulder, not elsewhere classified: Secondary | ICD-10-CM | POA: Diagnosis not present

## 2015-12-27 ENCOUNTER — Other Ambulatory Visit: Payer: Self-pay

## 2015-12-27 MED ORDER — SILODOSIN 8 MG PO CAPS: 8.0000 mg | ORAL_CAPSULE | Freq: Every day | ORAL | 1 refills | Status: DC

## 2015-12-27 MED ORDER — EPLERENONE 25 MG PO TABS: 25.0000 mg | ORAL_TABLET | Freq: Every day | ORAL | 1 refills | Status: DC

## 2015-12-27 NOTE — Telephone Encounter (Signed)
Rxs sent electronically.  
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
## 2016-02-14 ENCOUNTER — Encounter: Payer: Self-pay | Admitting: Interventional Cardiology

## 2016-02-14 ENCOUNTER — Ambulatory Visit (INDEPENDENT_AMBULATORY_CARE_PROVIDER_SITE_OTHER): Payer: Medicare Other | Admitting: Interventional Cardiology

## 2016-02-14 VITALS — BP 104/86 | HR 88 | Ht 68.0 in | Wt 228.6 lb

## 2016-02-14 DIAGNOSIS — I252 Old myocardial infarction: Secondary | ICD-10-CM

## 2016-02-14 DIAGNOSIS — I2510 Atherosclerotic heart disease of native coronary artery without angina pectoris: Secondary | ICD-10-CM

## 2016-02-14 DIAGNOSIS — E782 Mixed hyperlipidemia: Secondary | ICD-10-CM

## 2016-02-14 DIAGNOSIS — I119 Hypertensive heart disease without heart failure: Secondary | ICD-10-CM | POA: Diagnosis not present

## 2016-02-14 LAB — LIPID PANEL
CHOLESTEROL TOTAL: 221 mg/dL — AB (ref 100–199)
Chol/HDL Ratio: 5.3 ratio units — ABNORMAL HIGH (ref 0.0–5.0)
HDL: 42 mg/dL (ref >39)
LDL CALC: 150 mg/dL — AB (ref 0–99)
Triglycerides: 145 mg/dL (ref 0–149)
VLDL CHOLESTEROL CAL: 29 mg/dL (ref 5–40)

## 2016-02-14 NOTE — Patient Instructions (Signed)
Medication Instructions:  Same-no changes  Labwork: Lipids today  Testing/Procedures: None  Follow-Up: Your physician wants you to follow-up in: 1 year. You will receive a reminder letter in the mail two months in advance. If you don't receive a letter, please call our office to schedule the follow-up appointment.     If you need a refill on your cardiac medications before your next appointment, please call your pharmacy.

## 2016-02-25 DIAGNOSIS — Z961 Presence of intraocular lens: Secondary | ICD-10-CM | POA: Diagnosis not present

## 2016-02-25 DIAGNOSIS — H01021 Squamous blepharitis right upper eyelid: Secondary | ICD-10-CM | POA: Diagnosis not present

## 2016-02-25 DIAGNOSIS — H01022 Squamous blepharitis right lower eyelid: Secondary | ICD-10-CM | POA: Diagnosis not present

## 2016-02-25 DIAGNOSIS — H01025 Squamous blepharitis left lower eyelid: Secondary | ICD-10-CM | POA: Diagnosis not present

## 2016-02-25 DIAGNOSIS — H01024 Squamous blepharitis left upper eyelid: Secondary | ICD-10-CM | POA: Diagnosis not present
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
## 2016-03-04 ENCOUNTER — Ambulatory Visit (INDEPENDENT_AMBULATORY_CARE_PROVIDER_SITE_OTHER): Payer: Medicare Other | Admitting: Pharmacist

## 2016-03-04 ENCOUNTER — Encounter: Payer: Self-pay | Admitting: *Deleted

## 2016-03-04 VITALS — BP 118/68 | HR 64 | Ht 68.0 in | Wt 231.0 lb

## 2016-03-04 DIAGNOSIS — I2510 Atherosclerotic heart disease of native coronary artery without angina pectoris: Secondary | ICD-10-CM

## 2016-03-04 DIAGNOSIS — E785 Hyperlipidemia, unspecified: Secondary | ICD-10-CM | POA: Diagnosis not present

## 2016-03-04 DIAGNOSIS — Z006 Encounter for examination for normal comparison and control in clinical research program: Secondary | ICD-10-CM

## 2016-03-04 MED ORDER — ROSUVASTATIN CALCIUM 5 MG PO TABS: 5.0000 mg | ORAL_TABLET | Freq: Every day | ORAL | 3 refills | Status: DC

## 2016-03-04 NOTE — Patient Instructions (Addendum)
Start taking Crestor (rosvuastatin) 5mg  once a day.  Recheck your cholesterol in 2 months. Come in any time after 7:30am on Monday, April 30th for fasting cholesterol labs.

## 2016-03-04 NOTE — Progress Notes (Signed)
  Subject met inclusion and exclusion criteria. The informed consent form, study requirements and expectations were reviewed with the subject and questions and concerns were addressed prior to the signing of the consent form. The subject verbalized understanding of the trail requirements. The subject agreed to participate in the [place] Registryand signed the informed consent. The informed consent was obtained prior to performance of any protocol-specific procedures for the subject. A copy of the signed informed consent was given to the subject and a copy was placed in the subject's medical record.  Jake Bathe, RN 03/04/2016

## 2016-03-13 DIAGNOSIS — M1712 Unilateral primary osteoarthritis, left knee: Secondary | ICD-10-CM | POA: Diagnosis not present

## 2016-03-13 DIAGNOSIS — M25611 Stiffness of right shoulder, not elsewhere classified: Secondary | ICD-10-CM | POA: Diagnosis not present

## 2016-04-09 ENCOUNTER — Encounter: Payer: Self-pay | Admitting: Family Medicine

## 2016-04-09 ENCOUNTER — Ambulatory Visit (INDEPENDENT_AMBULATORY_CARE_PROVIDER_SITE_OTHER): Payer: Medicare Other | Admitting: Family Medicine

## 2016-04-09 VITALS — BP 128/84 | HR 82 | Temp 98.2°F | Wt 231.2 lb

## 2016-04-09 DIAGNOSIS — I2510 Atherosclerotic heart disease of native coronary artery without angina pectoris: Secondary | ICD-10-CM | POA: Diagnosis not present

## 2016-04-09 DIAGNOSIS — F418 Other specified anxiety disorders: Secondary | ICD-10-CM

## 2016-04-09 DIAGNOSIS — I10 Essential (primary) hypertension: Secondary | ICD-10-CM

## 2016-04-09 DIAGNOSIS — J069 Acute upper respiratory infection, unspecified: Secondary | ICD-10-CM | POA: Insufficient documentation

## 2016-04-09 DIAGNOSIS — B9789 Other viral agents as the cause of diseases classified elsewhere: Secondary | ICD-10-CM | POA: Diagnosis not present

## 2016-04-09 DIAGNOSIS — N400 Enlarged prostate without lower urinary tract symptoms: Secondary | ICD-10-CM | POA: Diagnosis not present

## 2016-04-09 MED ORDER — SILODOSIN 8 MG PO CAPS: 8.0000 mg | ORAL_CAPSULE | Freq: Every day | ORAL | 3 refills | Status: DC

## 2016-04-09 MED ORDER — GUAIFENESIN-CODEINE 100-10 MG/5ML PO SYRP: 5.0000 mL | ORAL_SOLUTION | Freq: Two times a day (BID) | ORAL | 0 refills | Status: DC | PRN

## 2016-04-09 NOTE — Assessment & Plan Note (Signed)
Chronic, stable. Continue exforge.  

## 2016-04-09 NOTE — Assessment & Plan Note (Signed)
Stable period. Continue prozac.

## 2016-04-09 NOTE — Progress Notes (Signed)
Pre visit review using our clinic review tool, if applicable. No additional management support is needed unless otherwise documented below in the visit note. 

## 2016-04-09 NOTE — Progress Notes (Signed)
BP 128/84   Pulse 82   Temp 98.2 F (36.8 C) (Oral)   Wt 231 lb 4 oz (104.9 kg)   SpO2 96%   BMI 35.16 kg/m    CC: 6 mo f/u visit  Subjective:    Patient ID: Travis Howell, male    DOB: 08-04-1950, 66 y.o.   MRN: [id number]  HPI: CHRISTROPHER GINTZ is a 66 y.o. male presenting on 04/09/2016 for Follow-up ((mail order pharm for meds)) and Cough ((Local pharm for meds))   Here alone today.  2d h/o cold symptoms - congestion, rhinorrhea, ST, mildly productive cough. No fevers/chills, ear or tooth pain, PNdrainage. Mild wheezing worse at night time.  Treating with allegra.  Wife sick at home as well.  Wife smokes at home.  No asthma history. No h/o allergies.   Mood disorder - h/o depression/anxiety. Per wife, ?paranoia and schizophrenia. Have declined psych evaluation. Stable on prozac.   BPH - on rapaflo 8mg  daily. No significant nocturia, strong stream, emptying well.   Has started going to gym more regularly.   Relevant past medical, surgical, family and social history reviewed and updated as indicated. Interim medical history since our last visit reviewed. Allergies and medications reviewed and updated. Outpatient Medications Prior to Visit  Medication Sig Dispense Refill  . amLODipine-valsartan (EXFORGE) 5-160 MG tablet Take 0.5 tablets by mouth daily.    Marland Kitchen aspirin 81 MG chewable tablet Chew 81 mg by mouth 2 (two) times daily.    Marland Kitchen FLUoxetine (PROZAC) 10 MG capsule TAKE ONE CAPSULE BY MOUTH EVERY DAY. TAPER AS ABLE. 30 capsule 10  . Omega-3 Fatty Acids (FISH OIL) 1000 MG CAPS Take 4,000 mg by mouth daily.    . Potassium 99 MG TABS Take 1 tablet by mouth 2 (two) times daily.    . rosuvastatin (CRESTOR) 5 MG tablet Take 1 tablet (5 mg total) by mouth daily. 90 tablet 3  . silodosin (RAPAFLO) 8 MG CAPS capsule Take 1 capsule (8 mg total) by mouth daily with breakfast. 90 capsule 1  . ezetimibe (ZETIA) 10 MG tablet Take 1 tablet (10 mg total) by mouth daily. 30 tablet 6    No facility-administered medications prior to visit.      Per HPI unless specifically indicated in ROS section below Review of Systems     Objective:    BP 128/84   Pulse 82   Temp 98.2 F (36.8 C) (Oral)   Wt 231 lb 4 oz (104.9 kg)   SpO2 96%   BMI 35.16 kg/m   Wt Readings from Last 3 Encounters:  04/09/16 231 lb 4 oz (104.9 kg)  03/04/16 231 lb (104.8 kg)  02/14/16 228 lb 9.6 oz (103.7 kg)    Physical Exam  Constitutional: He appears well-developed and well-nourished. No distress.  HENT:  Head: Normocephalic and atraumatic.  Right Ear: Hearing, tympanic membrane, external ear and ear canal normal.  Left Ear: Hearing, tympanic membrane, external ear and ear canal normal.  Nose: Mucosal edema (nasal mucosal congestion) present. No rhinorrhea. Right sinus exhibits no maxillary sinus tenderness and no frontal sinus tenderness. Left sinus exhibits no maxillary sinus tenderness and no frontal sinus tenderness.  Mouth/Throat: Uvula is midline, oropharynx is clear and moist and mucous membranes are normal. No oropharyngeal exudate, posterior oropharyngeal edema, posterior oropharyngeal erythema or tonsillar abscesses.  Eyes: Conjunctivae and EOM are normal. Pupils are equal, round, and reactive to light. No scleral icterus.  Neck: Normal range of motion. Neck supple.  Cardiovascular: Normal rate, regular rhythm, normal heart sounds and intact distal pulses.   No murmur heard. Pulmonary/Chest: Effort normal and breath sounds normal. No respiratory distress. He has no wheezes. He has no rales.  Lymphadenopathy:    He has no cervical adenopathy.  Skin: Skin is warm and dry. No rash noted.  Nursing note and vitals reviewed.  Results for orders placed or performed in visit on 02/14/16  Lipid panel  Result Value Ref Range   Cholesterol, Total 221 (H) 100 - 199 mg/dL   Triglycerides 145 0 - 149 mg/dL   HDL 42 >39 mg/dL   VLDL Cholesterol Cal 29 5 - 40 mg/dL   LDL Calculated 150  (H) 0 - 99 mg/dL   Chol/HDL Ratio 5.3 (H) 0.0 - 5.0 ratio units      Assessment & Plan:   Problem List Items Addressed This Visit    BPH (benign prostatic hyperplasia)    Pt denies obstructive or irritative symptoms. Continue rapaflo 8mg  daily.       Relevant Medications   silodosin (RAPAFLO) 8 MG CAPS capsule   Depression with anxiety    Stable period. Continue prozac.       HTN (hypertension)    Chronic, stable. Continue exforge.       Severe obesity (BMI 35.0-39.9) with comorbidity ([place])    Continue to encourage weight loss through healthy diet and lifestyle changes. He has started going to gym regularly.       Viral URI with cough - Primary    Anticipate viral given short duration. No signs of bacterial infection. Supportive care reviewed. Rx codeine cough syrup for night time cough suppression.          Follow up plan: Return in about 6 months (around 10/09/2016), or if symptoms worsen or fail to improve, for follow up visit.  [name], MD

## 2016-04-09 NOTE — Assessment & Plan Note (Signed)
Continue to encourage weight loss through healthy diet and lifestyle changes. He has started going to gym regularly.

## 2016-04-09 NOTE — Assessment & Plan Note (Signed)
Anticipate viral given short duration. No signs of bacterial infection. Supportive care reviewed. Rx codeine cough syrup for night time cough suppression.

## 2016-04-09 NOTE — Patient Instructions (Addendum)
Sign release of records for colonoscopy from [facility] in [place] (2014) You have a viral upper respiratory infection. Antibiotics are not needed for this. Viral infections usually take 7-10 days to resolve.  The cough can last a few weeks to go away. Use medication as prescribed: cheratussin codeine cough syrup Push fluids and plenty of rest. Please return if you are not improving as expected, or if you have high fevers (>101.5) or difficulty swallowing or worsening productive cough. Good to see you today, call us with questions. Return as needed or in 6 months for medicare wellness visit.

## 2016-04-09 NOTE — Assessment & Plan Note (Signed)
Pt denies obstructive or irritative symptoms. Continue rapaflo 8mg  daily.

## 2016-04-12 ENCOUNTER — Encounter: Payer: Self-pay | Admitting: Family Medicine

## 2016-05-04 ENCOUNTER — Other Ambulatory Visit: Payer: Medicare Other | Admitting: *Deleted

## 2016-05-04 DIAGNOSIS — E785 Hyperlipidemia, unspecified: Secondary | ICD-10-CM

## 2016-05-04 LAB — LIPID PANEL
CHOL/HDL RATIO: 2.5 ratio (ref 0.0–5.0)
Cholesterol, Total: 112 mg/dL (ref 100–199)
HDL: 44 mg/dL (ref >39)
LDL Calculated: 57 mg/dL (ref 0–99)
Triglycerides: 57 mg/dL (ref 0–149)
VLDL CHOLESTEROL CAL: 11 mg/dL (ref 5–40)

## 2016-05-08 DIAGNOSIS — M1712 Unilateral primary osteoarthritis, left knee: Secondary | ICD-10-CM | POA: Diagnosis not present

## 2016-05-15 DIAGNOSIS — M1712 Unilateral primary osteoarthritis, left knee: Secondary | ICD-10-CM | POA: Diagnosis not present

## 2016-05-22 ENCOUNTER — Other Ambulatory Visit: Payer: Self-pay

## 2016-05-22 ENCOUNTER — Other Ambulatory Visit: Payer: Self-pay | Admitting: *Deleted

## 2016-05-22 DIAGNOSIS — M1712 Unilateral primary osteoarthritis, left knee: Secondary | ICD-10-CM | POA: Diagnosis not present

## 2016-05-22 MED ORDER — FLUOXETINE HCL 10 MG PO CAPS: ORAL_CAPSULE | ORAL | 1 refills | Status: DC

## 2016-05-22 MED ORDER — EZETIMIBE 10 MG PO TABS: 10.0000 mg | ORAL_TABLET | Freq: Every day | ORAL | 2 refills | Status: DC

## 2016-05-22 NOTE — Telephone Encounter (Signed)
Verified with pt that he wanted to change his fluoxetine to his mail order.

## 2016-05-25 ENCOUNTER — Other Ambulatory Visit: Payer: Self-pay | Admitting: Interventional Cardiology

## 2016-09-23 DIAGNOSIS — M25512 Pain in left shoulder: Secondary | ICD-10-CM | POA: Diagnosis not present

## 2016-09-23 DIAGNOSIS — Z96611 Presence of right artificial shoulder joint: Secondary | ICD-10-CM | POA: Diagnosis not present

## 2016-09-25 DIAGNOSIS — J342 Deviated nasal septum: Secondary | ICD-10-CM | POA: Insufficient documentation

## 2016-09-25 DIAGNOSIS — J343 Hypertrophy of nasal turbinates: Secondary | ICD-10-CM | POA: Insufficient documentation

## 2016-10-05 HISTORY — PX: RHINOPLASTY: SUR1284

## 2016-10-07 ENCOUNTER — Other Ambulatory Visit: Payer: Self-pay | Admitting: Family Medicine

## 2016-10-08 ENCOUNTER — Encounter (HOSPITAL_COMMUNITY): Payer: Self-pay | Admitting: Family Medicine

## 2016-10-08 ENCOUNTER — Ambulatory Visit (HOSPITAL_COMMUNITY)
Admission: EM | Admit: 2016-10-08 | Discharge: 2016-10-08 | Disposition: A | Discharge status: Home / Self Care | Payer: Medicare Other | Attending: Emergency Medicine | Admitting: Emergency Medicine

## 2016-10-08 ENCOUNTER — Ambulatory Visit (INDEPENDENT_AMBULATORY_CARE_PROVIDER_SITE_OTHER): Payer: Medicare Other

## 2016-10-08 DIAGNOSIS — Z23 Encounter for immunization: Secondary | ICD-10-CM

## 2016-10-08 DIAGNOSIS — S299XXA Unspecified injury of thorax, initial encounter: Secondary | ICD-10-CM | POA: Diagnosis not present

## 2016-10-08 DIAGNOSIS — W19XXXA Unspecified fall, initial encounter: Secondary | ICD-10-CM

## 2016-10-08 DIAGNOSIS — S20212A Contusion of left front wall of thorax, initial encounter: Secondary | ICD-10-CM | POA: Diagnosis not present

## 2016-10-08 DIAGNOSIS — S65311A Laceration of deep palmar arch of right hand, initial encounter: Secondary | ICD-10-CM

## 2016-10-08 DIAGNOSIS — R0781 Pleurodynia: Secondary | ICD-10-CM | POA: Diagnosis not present

## 2016-10-08 MED ORDER — CEPHALEXIN 500 MG PO CAPS: 500.0000 mg | ORAL_CAPSULE | Freq: Three times a day (TID) | ORAL | 0 refills | Status: DC

## 2016-10-08 MED ORDER — TETANUS-DIPHTH-ACELL PERTUSSIS 5-2.5-18.5 LF-MCG/0.5 IM SUSP
0.5000 mL | Freq: Once | INTRAMUSCULAR | Status: AC
  Administered 2016-10-08: 0.5 mL via INTRAMUSCULAR

## 2016-10-08 MED ORDER — TETANUS-DIPHTH-ACELL PERTUSSIS 5-2.5-18.5 LF-MCG/0.5 IM SUSP
INTRAMUSCULAR | Status: AC
  Filled 2016-10-08: qty 0.5

## 2016-10-08 MED ORDER — LIDOCAINE-EPINEPHRINE (PF) 2 %-1:200000 IJ SOLN
INTRAMUSCULAR | Status: AC
  Filled 2016-10-08: qty 20
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
## 2016-10-08 NOTE — Discharge Instructions (Signed)
Keep the hand laceration covered, clean and dry for least 24 hours. He may gently wash it with mild soap and water daily and then tapped dry. Watch for any signs of infection as described in your laceration care instructions. This occurs return promptly. Take the antibiotic for the first 3 days to help prevent infection. Place ice over the areas of soreness to the chest and ribs.

## 2016-10-08 NOTE — ED Notes (Signed)
Patient transported to X-ray 

## 2016-10-08 NOTE — ED Triage Notes (Signed)
Pt here for fall off of a step ladder today. Reports hit head, denies LOC or dizziness. Not on blood thinners. Also has laceration to right palm, FA and left elbow, sts he also has abrasion to left side.

## 2016-10-09 ENCOUNTER — Encounter: Payer: Self-pay | Admitting: Family Medicine

## 2016-10-09 ENCOUNTER — Ambulatory Visit (INDEPENDENT_AMBULATORY_CARE_PROVIDER_SITE_OTHER): Payer: Medicare Other | Admitting: Family Medicine

## 2016-10-09 ENCOUNTER — Ambulatory Visit: Payer: 59 | Admitting: Family Medicine

## 2016-10-09 ENCOUNTER — Telehealth: Payer: Self-pay | Admitting: Family Medicine

## 2016-10-09 VITALS — BP 120/72 | HR 70 | Temp 98.1°F | Ht 66.75 in | Wt 220.5 lb

## 2016-10-09 DIAGNOSIS — N400 Enlarged prostate without lower urinary tract symptoms: Secondary | ICD-10-CM

## 2016-10-09 DIAGNOSIS — S61411A Laceration without foreign body of right hand, initial encounter: Secondary | ICD-10-CM | POA: Insufficient documentation

## 2016-10-09 DIAGNOSIS — E782 Mixed hyperlipidemia: Secondary | ICD-10-CM

## 2016-10-09 DIAGNOSIS — Z23 Encounter for immunization: Secondary | ICD-10-CM | POA: Diagnosis not present

## 2016-10-09 DIAGNOSIS — I10 Essential (primary) hypertension: Secondary | ICD-10-CM

## 2016-10-09 DIAGNOSIS — Z0000 Encounter for general adult medical examination without abnormal findings: Secondary | ICD-10-CM

## 2016-10-09 DIAGNOSIS — E785 Hyperlipidemia, unspecified: Secondary | ICD-10-CM | POA: Diagnosis not present

## 2016-10-09 DIAGNOSIS — D649 Anemia, unspecified: Secondary | ICD-10-CM | POA: Diagnosis not present

## 2016-10-09 DIAGNOSIS — S61411D Laceration without foreign body of right hand, subsequent encounter: Secondary | ICD-10-CM

## 2016-10-09 DIAGNOSIS — F418 Other specified anxiety disorders: Secondary | ICD-10-CM | POA: Diagnosis not present

## 2016-10-09 DIAGNOSIS — Z7189 Other specified counseling: Secondary | ICD-10-CM

## 2016-10-09 LAB — CBC WITH DIFFERENTIAL/PLATELET
BASOS PCT: 0.4 % (ref 0.0–3.0)
Basophils Absolute: 0 10*3/uL (ref 0.0–0.1)
EOS ABS: 0.1 10*3/uL (ref 0.0–0.7)
Eosinophils Relative: 1.8 % (ref 0.0–5.0)
HCT: 41 % (ref 39.0–52.0)
HEMOGLOBIN: 13.9 g/dL (ref 13.0–17.0)
Lymphocytes Relative: 16.2 % (ref 12.0–46.0)
Lymphs Abs: 1.2 10*3/uL (ref 0.7–4.0)
MCHC: 33.9 g/dL (ref 30.0–36.0)
MCV: 100.2 fl — ABNORMAL HIGH (ref 78.0–100.0)
MONO ABS: 0.5 10*3/uL (ref 0.1–1.0)
Monocytes Relative: 6.7 % (ref 3.0–12.0)
Neutro Abs: 5.3 10*3/uL (ref 1.4–7.7)
Neutrophils Relative %: 74.9 % (ref 43.0–77.0)
Platelets: 196 10*3/uL (ref 150.0–400.0)
RBC: 4.1 Mil/uL — AB (ref 4.22–5.81)
RDW: 13.5 % (ref 11.5–15.5)
WBC: 7.1 10*3/uL (ref 4.0–10.5)

## 2016-10-09 LAB — COMPREHENSIVE METABOLIC PANEL
ALBUMIN: 4.5 g/dL (ref 3.5–5.2)
ALT: 19 U/L (ref 0–53)
AST: 33 U/L (ref 0–37)
Alkaline Phosphatase: 61 U/L (ref 39–117)
BUN: 12 mg/dL (ref 6–23)
CHLORIDE: 102 meq/L (ref 96–112)
CO2: 29 mEq/L (ref 19–32)
CREATININE: 0.73 mg/dL (ref 0.40–1.50)
Calcium: 9.1 mg/dL (ref 8.4–10.5)
GFR: 114.6 mL/min (ref >60.00)
GLUCOSE: 130 mg/dL — AB (ref 70–99)
Potassium: 4.3 mEq/L (ref 3.5–5.1)
SODIUM: 139 meq/L (ref 135–145)
Total Bilirubin: 0.7 mg/dL (ref 0.2–1.2)
Total Protein: 7.1 g/dL (ref 6.0–8.3)

## 2016-10-09 LAB — PSA: PSA: 0.47 ng/mL (ref 0.10–4.00)

## 2016-10-09 NOTE — Progress Notes (Signed)
BP 120/72 (BP Location: Left Arm, Patient Position: Sitting, Cuff Size: Large)   Pulse 70   Temp 98.1 F (36.7 C) (Oral)   Ht 5' 6.75" (1.695 m)   Wt 220 lb 8 oz (100 kg)   SpO2 97%   BMI 34.79 kg/m    CC: medicare wellness visit Subjective:    Patient ID: Travis Howell, male    DOB: 1950-10-11, 66 y.o.   MRN: [id number]  HPI: Travis Howell is a 66 y.o. male presenting on 10/09/2016 for Medicare Wellness (Provided report of colonoscopy, 09/22/12) and Follow-up (Seen at [facility] 10/08/16 due to fall)   Fall yesterday off step ladder, seen at [facility] with 4 stitches to R palm and L chest wall injury. Treated with keflex abx as well.   Pending deviated septum nasal surgery 10/15/2016 for with L nostril obstruction.   Mood disorder - h/o depression/anxiety. Per wife, ?paranoia and schizophrenia. Have declined psych evaluation. Stable on prozac.   BPH - on rapaflo 8mg  daily longterm. No significant nocturia, strong stream, emptying well.   Passes depression screen.  Hearing Screening   125Hz  250Hz  500Hz  1000Hz  2000Hz  3000Hz  4000Hz  6000Hz  8000Hz   Right ear:   20 20 25  20     Left ear:   20 20 0  20    Vision Screening Comments: Last eye exam Feb/Mar 2018 with Dr. Katy Fitch   Preventative: Colonoscopy - brings report from 09/2012 - 2 TA/TVA, rpt 3 yrs [facility]) Prostate cancer screening - on rapaflo. Agrees to DRE today. Voiding well.  Lung cancer screening - quit smoking 2011, 1 pack per week <20 PY hx.  Flu shot yearly Tdap 2018 Pneumonia - due.  zostavax - 2014 shingrix - discussed.  Advanced directive discussion - has at home. HCPOA would be GF. Planning to update for [place]. Seat belt use discussed Sunscreen use and skin screen discussed  Ex smoker - quit 2011 No significant alcohol.  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 Interim medical history since our last visit reviewed. Allergies and medications reviewed and updated. Outpatient Medications Prior to Visit  Medication Sig Dispense Refill  . amLODipine-valsartan (EXFORGE) 5-160 MG tablet Take 0.5 tablets by mouth daily.    Marland Kitchen aspirin 81 MG chewable tablet Chew 81 mg by mouth 2 (two) times daily.    . cephALEXin (KEFLEX) 500 MG capsule Take 1 capsule (500 mg total) by mouth 3 (three) times daily. 10 capsule 0  . FLUoxetine (PROZAC) 10 MG capsule TAKE ONE CAPSULE BY MOUTH  EVERY DAY. TAPER AS ABLE. 90 capsule 0  . Omega-3 Fatty Acids (FISH OIL) 1000 MG CAPS Take 4,000 mg by mouth daily.    . Potassium 99 MG TABS Take 1 tablet by mouth 2 (two) times daily.    . silodosin (RAPAFLO) 8 MG CAPS capsule Take 1 capsule (8 mg total) by mouth daily with breakfast. 90 capsule 3  . ezetimibe (ZETIA) 10 MG tablet Take 1 tablet (10 mg total) by mouth daily. 90 tablet 2  . rosuvastatin (CRESTOR) 5 MG tablet Take 1 tablet (5 mg total) by mouth daily. 90 tablet 3  . ezetimibe (ZETIA) 10 MG tablet TAKE 1 TABLET BY MOUTH EVERY DAY 90 tablet 3  . guaiFENesin-codeine (  CHERATUSSIN AC) 100-10 MG/5ML syrup Take 5 mLs by mouth 2 (two) times daily as needed for cough (sedation precautions). 140 mL 0   No facility-administered medications prior to visit.      Per HPI unless specifically indicated in ROS section below Review of Systems     Objective:    BP 120/72 (BP Location: Left Arm, Patient Position: Sitting, Cuff Size: Large)   Pulse 70   Temp 98.1 F (36.7 C) (Oral)   Ht 5' 6.75" (1.695 m)   Wt 220 lb 8 oz (100 kg)   SpO2 97%   BMI 34.79 kg/m   Wt Readings from Last 3 Encounters:  10/09/16 220 lb 8 oz (100 kg)  04/09/16 231 lb 4 oz (104.9 kg)  03/04/16 231 lb (104.8 kg)    Physical Exam  Constitutional: He is oriented to  person, place, and time. He appears well-developed and well-nourished. No distress.  HENT:  Head: Normocephalic and atraumatic.  Right Ear: Hearing, tympanic membrane, external ear and ear canal normal.  Left Ear: Hearing, tympanic membrane, external ear and ear canal normal.  Nose: Nose normal.  Mouth/Throat: Uvula is midline, oropharynx is clear and moist and mucous membranes are normal. No oropharyngeal exudate, posterior oropharyngeal edema or posterior oropharyngeal erythema.  Eyes: Pupils are equal, round, and reactive to light. Conjunctivae and EOM are normal. No scleral icterus.  Neck: Normal range of motion. Neck supple. Carotid bruit is not present. No thyromegaly present.  Cardiovascular: Normal rate, regular rhythm, normal heart sounds and intact distal pulses.   No murmur heard. Pulses:      Radial pulses are 2+ on the right side, and 2+ on the left side.  Pulmonary/Chest: Effort normal and breath sounds normal. No respiratory distress. He has no wheezes. He has no rales.  Abdominal: Soft. Bowel sounds are normal. He exhibits no distension and no mass. There is no tenderness. There is no rebound and no guarding.  Genitourinary: Rectum normal and prostate normal. Rectal exam shows no external hemorrhoid, no internal hemorrhoid, no fissure, no mass, no tenderness and anal tone normal. Prostate is not enlarged and not tender.  Genitourinary Comments: Hardened prostate, possible nodule R lobe  Musculoskeletal: Normal range of motion. He exhibits no edema.  Lymphadenopathy:    He has no cervical adenopathy.  Neurological: He is alert and oriented to person, place, and time.  CN grossly intact, station and gait intact Unable to recall Trump Recall 3/3  Calculation 4/5 serial 7s  Skin: Skin is warm and dry. No rash noted.  Psychiatric: He has a normal mood and affect. His behavior is normal. Judgment and thought content normal.  Nursing note and vitals reviewed.  Results for orders  placed or performed in visit on 05/04/16  Lipid panel  Result Value Ref Range   Cholesterol, Total 112 100 - 199 mg/dL   Triglycerides 57 0 - 149 mg/dL   HDL 44 >39 mg/dL   VLDL Cholesterol Cal 11 5 - 40 mg/dL   LDL Calculated 57 0 - 99 mg/dL   Chol/HDL Ratio 2.5 0.0 - 5.0 ratio      Assessment & Plan:   Problem List Items Addressed This Visit    Advanced care planning/counseling discussion    Advanced directive discussion - has at home. HCPOA would be GF. Planning to update for [place].      BPH (benign prostatic hyperplasia)    Check PSA today. DRE with hardened prostate. Discussed may end up referring to urology  for evaluation of possible abnormal DRE.       Relevant Orders   PSA   Depression with anxiety    Stable period on prozac - continue.      HLD (hyperlipidemia)    Chronic stable. Continue current regimen of low dose crestor, fish oil, and zetia.       Relevant Orders   Comprehensive metabolic panel   HTN (hypertension)    Chronic, stable. Continue exforge.       Laceration of hand, right    Planned f/u next week for stitch removal.       Medicare annual wellness visit, subsequent - Primary    I have personally reviewed the Medicare Annual Wellness questionnaire and have noted 1. The patient's medical and social history 2. Their use of alcohol, tobacco or illicit drugs 3. Their current medications and supplements 4. The patient's functional ability including ADL's, fall risks, home safety risks and hearing or visual impairment. Cognitive function has been assessed and addressed as indicated.  5. Diet and physical activity 6. Evidence for depression or mood disorders The patients weight, height, BMI have been recorded in the chart. I have made referrals, counseling and provided education to the patient based on review of the above and I have provided the pt with a written personalized care plan for preventive services. Provider list updated.. See scanned  questionairre as needed for further documentation. Reviewed preventative protocols and updated unless pt declined.       Severe obesity (BMI 35.0-39.9) with comorbidity ([place])    Congratulated on weight loss to date  Reviewed healthy diet and lifestyle choices for sustainable weight loss.        Other Visit Diagnoses    Anemia, unspecified type       Relevant Orders   CBC with Differential/Platelet       Follow up plan: Return in about 1 year (around 10/09/2017) for medicare wellness visit.  [name], MD

## 2016-10-09 NOTE — Assessment & Plan Note (Signed)
Chronic, stable. Continue exforge.  

## 2016-10-09 NOTE — Assessment & Plan Note (Addendum)
Congratulated on weight loss to date  Reviewed healthy diet and lifestyle choices for sustainable weight loss.

## 2016-10-09 NOTE — Assessment & Plan Note (Signed)
Stable period on prozac - continue.  

## 2016-10-09 NOTE — Addendum Note (Signed)
Addended by: Brenton Grills on: 81/01/313 94:58 PM   Modules accepted: Orders

## 2016-10-09 NOTE — Telephone Encounter (Signed)
[facility]  Patient Name: Travis Howell  DOB: 1950-04-20    Initial Comment Caller states, pt . has an appt at 9:30am and needing to reschedule- her fell and ended up in urgent care- stitches in his hand. Wanting advise ?    Nurse Assessment  Nurse: Wynetta Emery, RN, Baker Janus Date/Time Eilene Ghazi Time): 10/09/2016 8:23:13 AM  Confirm and document reason for call. If symptomatic, describe symptoms. ---Berneta Sages fell off ladder has stitches to left chest -- sore and has cuts scrapes all over him-- has a appointment with Dr. Leo Grosser this am for a physcial. should they cancel.  Does the patient have any new or worsening symptoms? ---No  Please document clinical information provided and list any resource used. ---Nurse advised that they should follow up with Dr. Darnell Level -- they will keep appt.     Guidelines    Guideline Title Affirmed Question Affirmed Notes       Final Disposition User        Comments  NOTE Did not want triage just wanted to know if they should keep appt or cancel. Nurse advised to keep appt.

## 2016-10-09 NOTE — Patient Instructions (Addendum)
Flu shot today.  Labs today.  You are due for repeat colonoscopy - let us know when ready for referral.  [facility] about prior pneumonia shot record and bring [name] a copy.  If interested, check with pharmacy about new 2 shot shingles series (shingrix).  Work on Scientist, physiological.  Health Maintenance, Male A healthy lifestyle and preventive care is important for your health and wellness. Ask your health care provider about what schedule of regular examinations is right for you. What should I know about weight and diet? Eat a Healthy Diet  Eat plenty of vegetables, fruits, whole grains, low-fat dairy products, and lean protein.  Do not eat a lot of foods high in solid fats, added sugars, or salt.  Maintain a Healthy Weight Regular exercise can help you achieve or maintain a healthy weight. You should:  Do at least 150 minutes of exercise each week. The exercise should increase your heart rate and make you sweat (moderate-intensity exercise).  Do strength-training exercises at least twice a week.  Watch Your Levels of Cholesterol and Blood Lipids  Have your blood tested for lipids and cholesterol every 5 years starting at 65 years of age. If you are at high risk for heart disease, you should start having your blood tested when you are 65 years old. You may need to have your cholesterol levels checked more often if: ? Your lipid or cholesterol levels are high. ? You are older than 65 years of age. ? You are at high risk for heart disease.  What should I know about cancer screening? Many types of cancers can be detected early and may often be prevented. Lung Cancer  You should be screened every year for lung cancer if: ? You are a current smoker who has smoked for at least 30 years. ? You are a former smoker who has quit within the past 15 years.  Talk to your health care provider about your screening options, when you should start screening, and how often you should be  screened.  Colorectal Cancer  Routine colorectal cancer screening usually begins at 65 years of age and should be repeated every 5-10 years until you are 65 years old. You may need to be screened more often if early forms of precancerous polyps or small growths are found. Your health care provider may recommend screening at an earlier age if you have risk factors for colon cancer.  Your health care provider may recommend using home test kits to check for hidden blood in the stool.  A small camera at the end of a tube can be used to examine your colon (sigmoidoscopy or colonoscopy). This checks for the earliest forms of colorectal cancer.  Prostate and Testicular Cancer  Depending on your age and overall health, your health care provider may do certain tests to screen for prostate and testicular cancer.  Talk to your health care provider about any symptoms or concerns you have about testicular or prostate cancer.  Skin Cancer  Check your skin from head to toe regularly.  Tell your health care provider about any new moles or changes in moles, especially if: ? There is a change in a mole's size, shape, or color. ? You have a mole that is larger than a pencil eraser.  Always use sunscreen. Apply sunscreen liberally and repeat throughout the day.  Protect yourself by wearing long sleeves, pants, a wide-brimmed hat, and sunglasses when outside.  What should I know about heart disease, diabetes,  and high blood pressure?  If you are 76-42 years of age, have your blood pressure checked every 3-5 years. If you are 50 years of age or older, have your blood pressure checked every year. You should have your blood pressure measured twice-once when you are at a hospital or clinic, and once when you are not at a hospital or clinic. Record the average of the two measurements. To check your blood pressure when you are not at a hospital or clinic, you can use: ? An automated blood pressure machine at a  pharmacy. ? A home blood pressure monitor.  Talk to your health care provider about your target blood pressure.  If you are between 56-30 years old, ask your health care provider if you should take aspirin to prevent heart disease.  Have regular diabetes screenings by checking your fasting blood sugar level. ? If you are at a normal weight and have a low risk for diabetes, have this test once every three years after the age of 40. ? If you are overweight and have a high risk for diabetes, consider being tested at a younger age or more often.  A one-time screening for abdominal aortic aneurysm (AAA) by ultrasound is recommended for men aged 14-75 years who are current or former smokers. What should I know about preventing infection? Hepatitis B If you have a higher risk for hepatitis B, you should be screened for this virus. Talk with your health care provider to find out if you are at risk for hepatitis B infection. Hepatitis C Blood testing is recommended for:  Everyone born from [birth year] through 1964.  Anyone with known risk factors for hepatitis C.  Sexually Transmitted Diseases (STDs)  You should be screened each year for STDs including gonorrhea and chlamydia if: ? You are sexually active and are younger than 65 years of age. ? You are older than 65 years of age and your health care provider tells you that you are at risk for this type of infection. ? Your sexual activity has changed since you were last screened and you are at an increased risk for chlamydia or gonorrhea. Ask your health care provider if you are at risk.  Talk with your health care provider about whether you are at high risk of being infected with HIV. Your health care provider may recommend a prescription medicine to help prevent HIV infection.  What else can I do?  Schedule regular health, dental, and eye exams.  Stay current with your vaccines (immunizations).  Do not use any tobacco products, such as  cigarettes, chewing tobacco, and e-cigarettes. If you need help quitting, ask your health care provider.  Limit alcohol intake to no more than 2 drinks per day. One drink equals 12 ounces of beer, 5 ounces of wine, or 1 ounces of hard liquor.  Do not use street drugs.  Do not share needles.  Ask your health care provider for help if you need support or information about quitting drugs.  Tell your health care provider if you often feel depressed.  Tell your health care provider if you have ever been abused or do not feel safe at home. This information is not intended to replace advice given to you by your health care provider. Make sure you discuss any questions you have with your health care provider. Document Released: 06/20/2007 Document Revised: 08/21/2015 Document Reviewed: 09/25/2014 Elsevier Interactive Patient Education  [employer].

## 2016-10-09 NOTE — Assessment & Plan Note (Signed)
Check PSA today. DRE with hardened prostate. Discussed may end up referring to urology for evaluation of possible abnormal DRE.

## 2016-10-09 NOTE — Assessment & Plan Note (Signed)
Advanced directive discussion - has at home. HCPOA would be GF. Planning to update for [place].

## 2016-10-09 NOTE — Assessment & Plan Note (Signed)
Planned f/u next week for stitch removal.

## 2016-10-09 NOTE — Assessment & Plan Note (Signed)
Chronic stable. Continue current regimen of low dose crestor, fish oil, and zetia.

## 2016-10-09 NOTE — Assessment & Plan Note (Signed)

## 2016-10-09 NOTE — Telephone Encounter (Signed)
Pt scheduled 10/09/16 at 9:30 for CPX. FYI to Dr Darnell Level.

## 2016-10-15 DIAGNOSIS — J343 Hypertrophy of nasal turbinates: Secondary | ICD-10-CM | POA: Diagnosis not present

## 2016-10-15 DIAGNOSIS — J342 Deviated nasal septum: Secondary | ICD-10-CM | POA: Diagnosis not present

## 2016-10-20 ENCOUNTER — Encounter (HOSPITAL_COMMUNITY): Payer: Self-pay | Admitting: Emergency Medicine

## 2016-10-20 ENCOUNTER — Ambulatory Visit (HOSPITAL_COMMUNITY)
Admission: EM | Admit: 2016-10-20 | Discharge: 2016-10-20 | Disposition: A | Discharge status: Home / Self Care | Payer: Medicare Other | Attending: Family Medicine | Admitting: Family Medicine

## 2016-10-20 DIAGNOSIS — S65311D Laceration of deep palmar arch of right hand, subsequent encounter: Secondary | ICD-10-CM

## 2016-10-20 DIAGNOSIS — Z4802 Encounter for removal of sutures: Secondary | ICD-10-CM
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
## 2016-10-20 NOTE — ED Triage Notes (Signed)
PT has five sutures in right palm that were placed 12 days ago. No signs of infection.

## 2016-12-14 ENCOUNTER — Other Ambulatory Visit: Payer: Self-pay | Admitting: Family Medicine

## 2017-01-10 ENCOUNTER — Other Ambulatory Visit: Payer: Self-pay | Admitting: Interventional Cardiology

## 2017-01-11 ENCOUNTER — Encounter: Payer: Self-pay | Admitting: Family Medicine

## 2017-02-10 ENCOUNTER — Encounter: Payer: Self-pay | Admitting: Interventional Cardiology

## 2017-02-11 ENCOUNTER — Telehealth: Payer: Self-pay | Admitting: Family Medicine

## 2017-02-11 DIAGNOSIS — N400 Enlarged prostate without lower urinary tract symptoms: Secondary | ICD-10-CM

## 2017-02-11 DIAGNOSIS — R739 Hyperglycemia, unspecified: Secondary | ICD-10-CM

## 2017-02-11 NOTE — Telephone Encounter (Signed)
Pt coming in tomorrow for PSA and sugar recheck. Please enter labs

## 2017-02-12 ENCOUNTER — Other Ambulatory Visit (INDEPENDENT_AMBULATORY_CARE_PROVIDER_SITE_OTHER): Payer: Medicare Other

## 2017-02-12 DIAGNOSIS — N400 Enlarged prostate without lower urinary tract symptoms: Secondary | ICD-10-CM | POA: Diagnosis not present

## 2017-02-12 DIAGNOSIS — R739 Hyperglycemia, unspecified: Secondary | ICD-10-CM

## 2017-02-12 LAB — BASIC METABOLIC PANEL
BUN: 14 mg/dL (ref 6–23)
CALCIUM: 8.5 mg/dL (ref 8.4–10.5)
CO2: 32 meq/L (ref 19–32)
CREATININE: 0.73 mg/dL (ref 0.40–1.50)
Chloride: 106 mEq/L (ref 96–112)
GFR: 114.47 mL/min (ref >60.00)
Glucose, Bld: 137 mg/dL — ABNORMAL HIGH (ref 70–99)
Potassium: 4.2 mEq/L (ref 3.5–5.1)
SODIUM: 143 meq/L (ref 135–145)

## 2017-02-12 LAB — HEMOGLOBIN A1C: Hgb A1c MFr Bld: 6.5 % (ref 4.6–6.5)

## 2017-02-12 LAB — PSA: PSA: 0.25 ng/mL (ref 0.10–4.00)

## 2017-02-13 ENCOUNTER — Encounter: Payer: Self-pay | Admitting: Family Medicine

## 2017-02-13 DIAGNOSIS — E118 Type 2 diabetes mellitus with unspecified complications: Secondary | ICD-10-CM | POA: Insufficient documentation

## 2017-02-13 DIAGNOSIS — E1169 Type 2 diabetes mellitus with other specified complication: Secondary | ICD-10-CM | POA: Insufficient documentation

## 2017-02-13 DIAGNOSIS — E119 Type 2 diabetes mellitus without complications: Secondary | ICD-10-CM

## 2017-02-13 HISTORY — DX: Type 2 diabetes mellitus without complications: E11.9
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
## 2017-02-18 ENCOUNTER — Other Ambulatory Visit: Payer: Medicare Other | Admitting: *Deleted

## 2017-02-18 ENCOUNTER — Ambulatory Visit (INDEPENDENT_AMBULATORY_CARE_PROVIDER_SITE_OTHER): Payer: Medicare Other | Admitting: Interventional Cardiology

## 2017-02-18 ENCOUNTER — Encounter: Payer: Self-pay | Admitting: Interventional Cardiology

## 2017-02-18 VITALS — BP 138/100 | HR 92 | Ht 68.0 in | Wt 234.8 lb

## 2017-02-18 DIAGNOSIS — I119 Hypertensive heart disease without heart failure: Secondary | ICD-10-CM | POA: Insufficient documentation

## 2017-02-18 DIAGNOSIS — I252 Old myocardial infarction: Secondary | ICD-10-CM

## 2017-02-18 DIAGNOSIS — I2510 Atherosclerotic heart disease of native coronary artery without angina pectoris: Secondary | ICD-10-CM | POA: Diagnosis not present

## 2017-02-18 DIAGNOSIS — E782 Mixed hyperlipidemia: Secondary | ICD-10-CM | POA: Diagnosis not present

## 2017-02-18 MED ORDER — EZETIMIBE 10 MG PO TABS: 10.0000 mg | ORAL_TABLET | Freq: Every day | ORAL | 3 refills | Status: DC

## 2017-02-18 MED ORDER — ROSUVASTATIN CALCIUM 5 MG PO TABS: 5.0000 mg | ORAL_TABLET | Freq: Every day | ORAL | 3 refills | Status: DC

## 2017-02-18 MED ORDER — EZETIMIBE 10 MG PO TABS: 10.0000 mg | ORAL_TABLET | Freq: Every day | ORAL | 0 refills | Status: DC

## 2017-02-18 MED ORDER — ROSUVASTATIN CALCIUM 5 MG PO TABS: 5.0000 mg | ORAL_TABLET | Freq: Every day | ORAL | 0 refills | Status: DC

## 2017-02-18 NOTE — Addendum Note (Signed)
Addended by: Drue Novel I on: 02/18/2017 12:07 PM   Modules accepted: Orders

## 2017-02-18 NOTE — Patient Instructions (Signed)

## 2017-02-21 ENCOUNTER — Other Ambulatory Visit: Payer: Self-pay | Admitting: Family Medicine

## 2017-02-21 MED ORDER — FLUOXETINE HCL 10 MG PO CAPS: 10.0000 mg | ORAL_CAPSULE | Freq: Every day | ORAL | 1 refills | Status: DC

## 2017-02-25 DIAGNOSIS — Z961 Presence of intraocular lens: Secondary | ICD-10-CM | POA: Diagnosis not present

## 2017-02-25 DIAGNOSIS — H01024 Squamous blepharitis left upper eyelid: Secondary | ICD-10-CM | POA: Diagnosis not present

## 2017-02-25 DIAGNOSIS — H01025 Squamous blepharitis left lower eyelid: Secondary | ICD-10-CM | POA: Diagnosis not present

## 2017-02-25 DIAGNOSIS — H01021 Squamous blepharitis right upper eyelid: Secondary | ICD-10-CM | POA: Diagnosis not present

## 2017-02-25 DIAGNOSIS — H01022 Squamous blepharitis right lower eyelid: Secondary | ICD-10-CM | POA: Diagnosis not present

## 2017-04-07 ENCOUNTER — Other Ambulatory Visit: Payer: Self-pay | Admitting: Family Medicine

## 2017-04-10 ENCOUNTER — Other Ambulatory Visit: Payer: Self-pay | Admitting: Interventional Cardiology

## 2017-05-07 DIAGNOSIS — M25562 Pain in left knee: Secondary | ICD-10-CM | POA: Diagnosis not present

## 2017-05-07 DIAGNOSIS — M25561 Pain in right knee: Secondary | ICD-10-CM | POA: Diagnosis not present

## 2017-05-19 DIAGNOSIS — M1712 Unilateral primary osteoarthritis, left knee: Secondary | ICD-10-CM | POA: Diagnosis not present

## 2017-05-19 DIAGNOSIS — M1711 Unilateral primary osteoarthritis, right knee: Secondary | ICD-10-CM | POA: Diagnosis not present

## 2017-05-26 DIAGNOSIS — M1712 Unilateral primary osteoarthritis, left knee: Secondary | ICD-10-CM | POA: Diagnosis not present

## 2017-05-26 DIAGNOSIS — M1711 Unilateral primary osteoarthritis, right knee: Secondary | ICD-10-CM | POA: Diagnosis not present

## 2017-06-02 DIAGNOSIS — M1711 Unilateral primary osteoarthritis, right knee: Secondary | ICD-10-CM | POA: Diagnosis not present

## 2017-06-02 DIAGNOSIS — M1712 Unilateral primary osteoarthritis, left knee: Secondary | ICD-10-CM | POA: Diagnosis not present

## 2017-08-02 ENCOUNTER — Other Ambulatory Visit: Payer: Self-pay | Admitting: Family Medicine

## 2017-08-27 DIAGNOSIS — M25511 Pain in right shoulder: Secondary | ICD-10-CM | POA: Diagnosis not present

## 2017-09-08 ENCOUNTER — Telehealth: Payer: Self-pay

## 2017-09-08 NOTE — Telephone Encounter (Signed)
Noted.  I also do not see any messages indicating someone from our office called pt.

## 2017-09-08 NOTE — Telephone Encounter (Signed)
Copied from [place] [phone] [phone]. Topic: Inquiry >> Sep 07, 2017 12:57 PM Scherrie Gerlach wrote: Reason for CRM: pt states he received a call from the office, concerning his colonoscopy. (pt's wife was called) Pt had his last one in [place]. I do not see a message. If you need to speak with him, please call number provided. thanks

## 2017-10-10 ENCOUNTER — Other Ambulatory Visit: Payer: Self-pay | Admitting: Family Medicine

## 2017-10-10 DIAGNOSIS — E782 Mixed hyperlipidemia: Secondary | ICD-10-CM

## 2017-10-10 DIAGNOSIS — N400 Enlarged prostate without lower urinary tract symptoms: Secondary | ICD-10-CM

## 2017-10-10 DIAGNOSIS — E119 Type 2 diabetes mellitus without complications: Secondary | ICD-10-CM

## 2017-10-11 ENCOUNTER — Ambulatory Visit (INDEPENDENT_AMBULATORY_CARE_PROVIDER_SITE_OTHER): Payer: Medicare Other

## 2017-10-11 ENCOUNTER — Ambulatory Visit: Payer: Medicare Other

## 2017-10-11 VITALS — BP 132/90 | HR 58 | Temp 98.2°F | Ht 68.0 in | Wt 225.8 lb

## 2017-10-11 DIAGNOSIS — E119 Type 2 diabetes mellitus without complications: Secondary | ICD-10-CM

## 2017-10-11 DIAGNOSIS — Z23 Encounter for immunization: Secondary | ICD-10-CM | POA: Diagnosis not present

## 2017-10-11 DIAGNOSIS — Z0000 Encounter for general adult medical examination without abnormal findings: Secondary | ICD-10-CM

## 2017-10-11 DIAGNOSIS — N400 Enlarged prostate without lower urinary tract symptoms: Secondary | ICD-10-CM

## 2017-10-11 DIAGNOSIS — E782 Mixed hyperlipidemia: Secondary | ICD-10-CM

## 2017-10-11 LAB — LIPID PANEL
CHOL/HDL RATIO: 3
Cholesterol: 130 mg/dL (ref 0–200)
HDL: 43.5 mg/dL (ref >39.00)
LDL CALC: 69 mg/dL (ref 0–99)
NONHDL: 86.12
TRIGLYCERIDES: 87 mg/dL (ref 0.0–149.0)
VLDL: 17.4 mg/dL (ref 0.0–40.0)

## 2017-10-11 LAB — COMPREHENSIVE METABOLIC PANEL
ALT: 18 U/L (ref 0–53)
AST: 25 U/L (ref 0–37)
Albumin: 4.4 g/dL (ref 3.5–5.2)
Alkaline Phosphatase: 59 U/L (ref 39–117)
BUN: 11 mg/dL (ref 6–23)
CHLORIDE: 101 meq/L (ref 96–112)
CO2: 31 meq/L (ref 19–32)
CREATININE: 0.71 mg/dL (ref 0.40–1.50)
Calcium: 9.1 mg/dL (ref 8.4–10.5)
GFR: 117.96 mL/min (ref >60.00)
GLUCOSE: 146 mg/dL — AB (ref 70–99)
Potassium: 4.4 mEq/L (ref 3.5–5.1)
SODIUM: 138 meq/L (ref 135–145)
Total Bilirubin: 0.6 mg/dL (ref 0.2–1.2)
Total Protein: 6.9 g/dL (ref 6.0–8.3)

## 2017-10-11 LAB — HEMOGLOBIN A1C: Hgb A1c MFr Bld: 6.6 % — ABNORMAL HIGH (ref 4.6–6.5)

## 2017-10-11 LAB — PSA: PSA: 0.5 ng/mL (ref 0.10–4.00)

## 2017-10-11 NOTE — Progress Notes (Signed)
PCP notes:   Health maintenance:  Foot exam - PCP follow-up needed Colonoscopy - pt requested GI referral  A1C - completed PCV13 - administered Flu vaccine - administered  Abnormal screenings:   Hearing - failed  Hearing Screening   125Hz  250Hz  500Hz  1000Hz  2000Hz  3000Hz  4000Hz  6000Hz  8000Hz   Right ear:   40 40 40  0    Left ear:   40 40 40  0     Patient concerns:   New glucometer - patient and/or spouse will contact insurance for approved glucometers and provide info to PCP at next appt  Prescription for rescue inhaler - per spouse, patient has been wheezing for past several days. Has used Proventil in past.   Nurse concerns:  None  Next PCP appt:   10/13/17 @ 1030
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
## 2017-10-11 NOTE — Patient Instructions (Signed)
Mr. Valley , Thank you for taking time to come for your Medicare Wellness Visit. I appreciate your ongoing commitment to your health goals. Please review the following plan we discussed and let me know if I can assist you in the future.   These are the goals we discussed: Goals    . Increase water intake     Starting 10/11/2017, I will continue to drink at least 6-8 glasses of water daily.        This is a list of the screening recommended for you and due dates:  Health Maintenance  Topic Date Due  . Complete foot exam   10/13/2017*  . Colon Cancer Screening  01/04/2018*  . Eye exam for diabetics  02/05/2018  . Hemoglobin A1C  04/12/2018  . Pneumonia vaccines (2 of 2 - PPSV23) 10/12/2018  . DTaP/Tdap/Td vaccine (2 - Td) 10/09/2026  . Tetanus Vaccine  10/09/2026  . Flu Shot  Completed  .  Hepatitis C: One time screening is recommended by Center for Disease Control  (CDC) for  adults born from [birth year] through 1964.   Completed  *Topic was postponed. The date shown is not the original due date.   Preventive Care for Adults  A healthy lifestyle and preventive care can promote health and wellness. Preventive health guidelines for adults include the following key practices.  . A routine yearly physical is a good way to check with your health care provider about your health and preventive screening. It is a chance to share any concerns and updates on your health and to receive a thorough exam.  . Visit your dentist for a routine exam and preventive care every 6 months. Brush your teeth twice a day and floss once a day. Good oral hygiene prevents tooth decay and gum disease.  . The frequency of eye exams is based on your age, health, family medical history, use  of contact lenses, and other factors. Follow your health care provider's recommendations for frequency of eye exams.  . Eat a healthy diet. Foods like vegetables, fruits, whole grains, low-fat dairy products, and lean protein foods  contain the nutrients you need without too many calories. Decrease your intake of foods high in solid fats, added sugars, and salt. Eat the right amount of calories for you. Get information about a proper diet from your health care provider, if necessary.  . Regular physical exercise is one of the most important things you can do for your health. Most adults should get at least 150 minutes of moderate-intensity exercise (any activity that increases your heart rate and causes you to sweat) each week. In addition, most adults need muscle-strengthening exercises on 2 or more days a week.  Silver Sneakers may be a benefit available to you. To determine eligibility, you may visit the website: www.silversneakers.com or contact program at (204) 485-7911 Mon-Fri between 8AM-8PM.   . Maintain a healthy weight. The body mass index (BMI) is a screening tool to identify possible weight problems. It provides an estimate of body fat based on height and weight. Your health care provider can find your BMI and can help you achieve or maintain a healthy weight.   For adults 20 years and older: ? A BMI below 18.5 is considered underweight. ? A BMI of 18.5 to 24.9 is normal. ? A BMI of 25 to 29.9 is considered overweight. ? A BMI of 30 and above is considered obese.   . Maintain normal blood lipids and cholesterol levels by exercising and  minimizing your intake of saturated fat. Eat a balanced diet with plenty of fruit and vegetables. Blood tests for lipids and cholesterol should begin at age 29 and be repeated every 5 years. If your lipid or cholesterol levels are high, you are over 50, or you are at high risk for heart disease, you may need your cholesterol levels checked more frequently. Ongoing high lipid and cholesterol levels should be treated with medicines if diet and exercise are not working.  . If you smoke, find out from your health care provider how to quit. If you do not use tobacco, please do not  start.  . If you choose to drink alcohol, please do not consume more than 2 drinks per day. One drink is considered to be 12 ounces (355 mL) of beer, 5 ounces (148 mL) of wine, or 1.5 ounces (44 mL) of liquor.  . If you are 29-20 years old, ask your health care provider if you should take aspirin to prevent strokes.  . Use sunscreen. Apply sunscreen liberally and repeatedly throughout the day. You should seek shade when your shadow is shorter than you. Protect yourself by wearing long sleeves, pants, a wide-brimmed hat, and sunglasses year round, whenever you are outdoors.  . Once a month, do a whole body skin exam, using a mirror to look at the skin on your back. Tell your health care provider of new moles, moles that have irregular borders, moles that are larger than a pencil eraser, or moles that have changed in shape or color.

## 2017-10-12 ENCOUNTER — Encounter: Payer: Self-pay | Admitting: Family Medicine

## 2017-10-12 NOTE — Progress Notes (Signed)
BP 124/70 (BP Location: Left Arm, Patient Position: Sitting, Cuff Size: Normal)   Pulse 72   Temp 98.2 F (36.8 C) (Oral)   Ht 5\' 8"  (1.727 m)   Wt 224 lb 8 oz (101.8 kg)   SpO2 93%   BMI 34.14 kg/m    CC: AMW f/u visit Subjective:    Patient ID: Travis Howell, male    DOB: [birth year], 67 y.o.   MRN: [id number]  HPI: Travis Howell is a 67 y.o. male presenting on 10/13/2017 for Annual Exam (Pt 2. Wants to discuss getting inhaler rx. ) and Rash (C/o rash on right side of face. Sometimes area itches and is red. Noticed about 1 wk ago.)   Saw Lesia this week for medicare wellness visit. Note reviewed.    New rash to R side of face over the past week - not itchy or tender. Wonders if related to crestor.   Had sinus surgery/nasal septal repair 10/2016 [name] at [facility]) with benefit.  Increased OSA risk by [facility]ANG 09/2015. Doesn't snore. Apnea improved after nasal septal repair. No significant daytime somnolence.   Increased wheezing noted recently - previously used inhaler. 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 Chronic productive cough. Ex smoker - quit 2011. Notes increased exertional dyspnea going up hill or up a flight stair.   Had knee injections recently (rooster cone). Limited gym activity   DM - unsure which glucometer is preferred by insurance.  Preventative: Colonoscopy 09/2012 - 2 TA/TVA, rpt 3 yrs [facility]). Overdue for f/u. Declined last year Prostate cancer screening - on rapaflo. Agrees to DRE today. Voiding well.  Lung cancer screening - quit smoking 2011, 1 pack per week <20 PY hx.  Flu shot yearly Prevnar 2019 Tdap 2018 zostavax - 2014 shingrix - discussed.  Advanced directive discussion - has at home. HCPOA would be GF. Planning to update for [place]. Seat belt use discussed Sunscreen use and skin screen discussed  Ex smoker - quit 2011 No  significant alcohol. 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 Interim medical history since our last visit reviewed. Allergies and medications reviewed and updated. Outpatient Medications Prior to Visit  Medication Sig Dispense Refill  . amLODipine-valsartan (EXFORGE) 5-160 MG tablet Take 0.5 tablets by mouth daily.    Marland Kitchen aspirin 81 MG chewable tablet Chew 81 mg by mouth 2 (two) times daily.    . Omega-3 Fatty Acids (FISH OIL) 1000 MG CAPS Take 4,000 mg by mouth daily.    . Potassium 99 MG TABS Take 1 tablet by mouth 2 (two) times daily.    Marland Kitchen ezetimibe (ZETIA) 10 MG tablet Take 1 tablet (10 mg total) by mouth daily. 90 tablet 3  . FLUoxetine (PROZAC) 10 MG capsule TAKE 1 CAPSULE BY MOUTH  DAILY 90 capsule 0  . rosuvastatin (CRESTOR) 5 MG tablet TAKE 1 TABLET BY MOUTH EVERY DAY 30 tablet 10  . silodosin (RAPAFLO) 8 MG CAPS capsule TAKE 1 CAPSULE BY MOUTH  DAILY WITH BREAKFAST 90 capsule 3  . rosuvastatin (CRESTOR) 5 MG tablet Take 1 tablet (5 mg total) by mouth daily. 90 tablet 3  No facility-administered medications prior to visit.      Per HPI unless specifically indicated in ROS section below Review of Systems     Objective:    BP 124/70 (BP Location: Left Arm, Patient Position: Sitting, Cuff Size: Normal)   Pulse 72   Temp 98.2 F (36.8 C) (Oral)   Ht 5\' 8"  (1.727 m)   Wt 224 lb 8 oz (101.8 kg)   SpO2 93%   BMI 34.14 kg/m   Wt Readings from Last 3 Encounters:  10/13/17 224 lb 8 oz (101.8 kg)  10/11/17 225 lb 12 oz (102.4 kg)  02/18/17 234 lb 12.8 oz (106.5 kg)    Physical Exam  Constitutional: He is oriented to person, place, and time. He appears well-developed and well-nourished. No distress.  HENT:    Head: Normocephalic and atraumatic.  Right Ear: Hearing, tympanic membrane, external ear and ear canal normal.  Left Ear: Hearing, tympanic membrane, external ear and ear canal normal.  Nose: Nose normal.  Mouth/Throat: Uvula is midline, oropharynx is clear and moist and mucous membranes are normal. No oropharyngeal exudate, posterior oropharyngeal edema or posterior oropharyngeal erythema.  Eyes: Pupils are equal, round, and reactive to light. Conjunctivae and EOM are normal. No scleral icterus.  Neck: Normal range of motion. Neck supple. Carotid bruit is not present. No thyromegaly present.  Cardiovascular: Normal rate, regular rhythm, normal heart sounds and intact distal pulses.  No murmur heard. Pulses:      Radial pulses are 2+ on the right side, and 2+ on the left side.  Pulmonary/Chest: Effort normal and breath sounds normal. No respiratory distress. He has no wheezes. He has no rales.  Abdominal: Soft. Bowel sounds are normal. He exhibits no distension and no mass. There is no tenderness. There is no rebound and no guarding.  Musculoskeletal: Normal range of motion. He exhibits no edema.  Lymphadenopathy:    He has no cervical adenopathy.  Neurological: He is alert and oriented to person, place, and time.  CN grossly intact, station and gait intact  Skin: Skin is warm and dry. Rash noted.  Papular rash R>L cheek   Psychiatric: He has a normal mood and affect. His behavior is normal. Judgment and thought content normal.  Nursing note and vitals reviewed.  Results for orders placed or performed in visit on 10/11/17  PSA  Result Value Ref Range   PSA 0.50 0.10 - 4.00 ng/mL  Hemoglobin A1c  Result Value Ref Range   Hgb A1c MFr Bld 6.6 (H) 4.6 - 6.5 %  Lipid panel  Result Value Ref Range   Cholesterol 130 0 - 200 mg/dL   Triglycerides 87.0 0.0 - 149.0 mg/dL   HDL 43.50 >39.00 mg/dL   VLDL 17.4 0.0 - 40.0 mg/dL   LDL Cholesterol 69 0 - 99 mg/dL   Total CHOL/HDL Ratio 3     NonHDL 86.12   Comprehensive metabolic panel  Result Value Ref Range   Sodium 138 135 - 145 mEq/L   Potassium 4.4 3.5 - 5.1 mEq/L   Chloride 101 96 - 112 mEq/L   CO2 31 19 - 32 mEq/L   Glucose, Bld 146 (H) 70 - 99 mg/dL   BUN 11 6 - 23 mg/dL   Creatinine, Ser 0.71 0.40 - 1.50 mg/dL   Total Bilirubin 0.6 0.2 - 1.2 mg/dL   Alkaline Phosphatase 59 39 - 117 U/L   AST 25 0 - 37 U/L   ALT 18 0 - 53 U/L  Total Protein 6.9 6.0 - 8.3 g/dL   Albumin 4.4 3.5 - 5.2 g/dL   Calcium 9.1 8.4 - 10.5 mg/dL   GFR 117.96 >60.00 mL/min      Assessment & Plan:   Problem List Items Addressed This Visit    Skin rash    Of R>L face - will change crestor to QOD dosing and monitor effect.       Obesity, Class I, BMI 30-34.9    Encouraged ongoing healthy diet and lifestyle changes to affect sustainable weight loss.       Lung disease    Extensive history of work in [employer]. He states he has been evaluated and tested negative for black lung. Offered return for lung function testing.  Albuterol refilled.       HTN (hypertension)    Chronic, stable. Continue exforge. Does not need refill today.       Relevant Medications   ezetimibe (ZETIA) 10 MG tablet   rosuvastatin (CRESTOR) 5 MG tablet   HLD (hyperlipidemia)    Chronic, stable. Continue crestor - pt worried this is causing facial rash - will change to QOD dosing and monitor effect.  The ASCVD Risk score Mikey Bussing DC Jr., et al., 2013) failed to calculate for the following reasons:   The patient has a prior MI or stroke diagnosis       Relevant Medications   ezetimibe (ZETIA) 10 MG tablet   rosuvastatin (CRESTOR) 5 MG tablet   Diet-controlled type 2 diabetes mellitus ([place]) - Primary    Reviewed A1c, continue to encourage low sugar diet.  RTC 6 mo f/u visit. I did ask him to check with insurance company on preferred glucometer.       Relevant Medications   rosuvastatin (CRESTOR) 5 MG tablet   Depression with anxiety    Stable period -  continue prozac.       Relevant Medications   FLUoxetine (PROZAC) 10 MG capsule   CAD (coronary artery disease)    Followed by cards - appreciate their care.  He has decided to continue aspirin BID dosing.       Relevant Medications   ezetimibe (ZETIA) 10 MG tablet   rosuvastatin (CRESTOR) 5 MG tablet   BPH (benign prostatic hyperplasia)    Continue rapaflo Forgot to check DRE - will check next visit.       Relevant Medications   silodosin (RAPAFLO) 8 MG CAPS capsule    Other Visit Diagnoses    Special screening for malignant neoplasms, colon       Relevant Orders   Ambulatory referral to Gastroenterology       Meds ordered this encounter  Medications  . ezetimibe (ZETIA) 10 MG tablet    Sig: Take 1 tablet (10 mg total) by mouth daily.    Dispense:  90 tablet    Refill:  3  . FLUoxetine (PROZAC) 10 MG capsule    Sig: Take 1 capsule (10 mg total) by mouth daily.    Dispense:  90 capsule    Refill:  3  . rosuvastatin (CRESTOR) 5 MG tablet    Sig: Take 1 tablet (5 mg total) by mouth daily.    Dispense:  90 tablet    Refill:  3  . silodosin (RAPAFLO) 8 MG CAPS capsule    Sig: TAKE 1 CAPSULE BY MOUTH  DAILY WITH BREAKFAST    Dispense:  90 capsule    Refill:  3  . albuterol (PROVENTIL HFA;VENTOLIN HFA) 108 (90  Base) MCG/ACT inhaler    Sig: Inhale 2 puffs into the lungs every 6 (six) hours as needed for wheezing or shortness of breath.    Dispense:  1 Inhaler    Refill:  3   Orders Placed This Encounter  Procedures  . Ambulatory referral to Gastroenterology    Referral Priority:   Routine    Referral Type:   Consultation    Referral Reason:   Specialty Services Required    Number of Visits Requested:   1    Follow up plan: Return in about 6 months (around 04/14/2018) for follow up visit.  [name], MD

## 2017-10-13 ENCOUNTER — Encounter: Payer: Self-pay | Admitting: Family Medicine

## 2017-10-13 ENCOUNTER — Ambulatory Visit (INDEPENDENT_AMBULATORY_CARE_PROVIDER_SITE_OTHER): Payer: Medicare Other | Admitting: Family Medicine

## 2017-10-13 VITALS — BP 124/70 | HR 72 | Temp 98.2°F | Ht 68.0 in | Wt 224.5 lb

## 2017-10-13 DIAGNOSIS — I10 Essential (primary) hypertension: Secondary | ICD-10-CM

## 2017-10-13 DIAGNOSIS — J984 Other disorders of lung: Secondary | ICD-10-CM | POA: Insufficient documentation

## 2017-10-13 DIAGNOSIS — F418 Other specified anxiety disorders: Secondary | ICD-10-CM

## 2017-10-13 DIAGNOSIS — E669 Obesity, unspecified: Secondary | ICD-10-CM

## 2017-10-13 DIAGNOSIS — E782 Mixed hyperlipidemia: Secondary | ICD-10-CM | POA: Diagnosis not present

## 2017-10-13 DIAGNOSIS — E119 Type 2 diabetes mellitus without complications: Secondary | ICD-10-CM

## 2017-10-13 DIAGNOSIS — I2510 Atherosclerotic heart disease of native coronary artery without angina pectoris: Secondary | ICD-10-CM | POA: Diagnosis not present

## 2017-10-13 DIAGNOSIS — N400 Enlarged prostate without lower urinary tract symptoms: Secondary | ICD-10-CM | POA: Diagnosis not present

## 2017-10-13 DIAGNOSIS — R21 Rash and other nonspecific skin eruption: Secondary | ICD-10-CM | POA: Insufficient documentation

## 2017-10-13 DIAGNOSIS — Z1211 Encounter for screening for malignant neoplasm of colon: Secondary | ICD-10-CM | POA: Diagnosis not present

## 2017-10-13 MED ORDER — FLUOXETINE HCL 10 MG PO CAPS: 10.0000 mg | ORAL_CAPSULE | Freq: Every day | ORAL | 3 refills | Status: DC

## 2017-10-13 MED ORDER — SILODOSIN 8 MG PO CAPS: ORAL_CAPSULE | ORAL | 3 refills | Status: DC

## 2017-10-13 MED ORDER — ROSUVASTATIN CALCIUM 5 MG PO TABS: 5.0000 mg | ORAL_TABLET | Freq: Every day | ORAL | 3 refills | Status: DC

## 2017-10-13 MED ORDER — EZETIMIBE 10 MG PO TABS
10.0000 mg | ORAL_TABLET | Freq: Every day | ORAL | 3 refills | Status: DC
Start: 2017-10-13 — End: 2018-10-13

## 2017-10-13 MED ORDER — ALBUTEROL SULFATE HFA 108 (90 BASE) MCG/ACT IN AERS: 2.0000 | INHALATION_SPRAY | Freq: Four times a day (QID) | RESPIRATORY_TRACT | 3 refills | Status: DC | PRN

## 2017-10-13 NOTE — Assessment & Plan Note (Addendum)
Followed by cards - appreciate their care.  He has decided to continue aspirin BID dosing.

## 2017-10-13 NOTE — Assessment & Plan Note (Addendum)
Extensive history of work in [employer]. He states he has been evaluated and tested negative for black lung. Offered return for lung function testing.  Albuterol refilled.

## 2017-10-13 NOTE — Assessment & Plan Note (Signed)
Chronic, stable. Continue crestor - pt worried this is causing facial rash - will change to QOD dosing and monitor effect.  The ASCVD Risk score Mikey Bussing DC Jr., et al., 2013) failed to calculate for the following reasons:   The patient has a prior MI or stroke diagnosis

## 2017-10-13 NOTE — Assessment & Plan Note (Addendum)
Continue rapaflo Forgot to check DRE - will check next visit.

## 2017-10-13 NOTE — Assessment & Plan Note (Signed)
Of R>L face - will change crestor to QOD dosing and monitor effect.

## 2017-10-13 NOTE — Patient Instructions (Addendum)
Change crestor to every other day to see if rash improves.  Albuterol rescue inhaler refilled today to use as needed for shortness of breath or wheezing.  If interested, check with pharmacy about new 2 shot shingles series (shingrix).  Check with insurance about preferred glucose meter Albuterol sent local pharmacy.  Consider returning for lung function test to evaluate for asthma or other lung disease Return in 6 months for diabetes follow up.

## 2017-10-13 NOTE — Assessment & Plan Note (Signed)
Reviewed A1c, continue to encourage low sugar diet.  RTC 6 mo f/u visit. I did ask him to check with insurance company on preferred glucometer.

## 2017-10-13 NOTE — Assessment & Plan Note (Signed)
Chronic, stable. Continue exforge. Does not need refill today.

## 2017-10-13 NOTE — Assessment & Plan Note (Signed)
Stable period - continue prozac.

## 2017-10-13 NOTE — Assessment & Plan Note (Signed)
Encouraged ongoing healthy diet and lifestyle changes to affect sustainable weight loss.  

## 2017-10-14 ENCOUNTER — Telehealth: Payer: Self-pay | Admitting: Internal Medicine

## 2017-10-14 NOTE — Telephone Encounter (Signed)
History of tubulovillous adenoma and tubular adenoma in 2014 Due for surveillance colonoscopy now Can be scheduled

## 2017-10-14 NOTE — Telephone Encounter (Signed)
Received referral for patient to be seen for next colon. Spoke to patient who states last colon was 2014 in [place]. Colon report is in Epic under procedures. Patient not requesting certain MD to review records. DOD for referral date 49.9.19 is Dr.Pyrtle. Please review and advise on scheduling.

## 2017-10-20 ENCOUNTER — Encounter: Payer: Self-pay | Admitting: Internal Medicine

## 2017-11-03 NOTE — Telephone Encounter (Signed)
Patient was called and scheduled for colonoscopy in [place] and pv.

## 2017-11-05 HISTORY — PX: COLONOSCOPY: SHX174

## 2017-11-13 NOTE — Progress Notes (Signed)
I reviewed health advisor's note, was available for consultation, and agree with documentation and plan.  

## 2017-11-16 ENCOUNTER — Ambulatory Visit (AMBULATORY_SURGERY_CENTER): Payer: Self-pay | Admitting: *Deleted

## 2017-11-16 VITALS — Ht 68.0 in | Wt 228.0 lb

## 2017-11-16 DIAGNOSIS — Z86010 Personal history of colonic polyps: Secondary | ICD-10-CM

## 2017-11-16 MED ORDER — NA SULFATE-K SULFATE-MG SULF 17.5-3.13-1.6 GM/177ML PO SOLN: ORAL | 0 refills | Status: DC

## 2017-11-16 NOTE — Progress Notes (Signed)
Patient denies any allergies to eggs or soy. Patient denies any problems with anesthesia/sedation. Patient denies any oxygen use at home. Patient denies taking any diet/weight loss medications or blood thinners. Patient is on a research medication for cholesterol, denies blood thinner. EMMI education offered, pt declined.

## 2017-11-24 ENCOUNTER — Ambulatory Visit (AMBULATORY_SURGERY_CENTER): Payer: Medicare Other | Admitting: Internal Medicine

## 2017-11-24 ENCOUNTER — Other Ambulatory Visit: Payer: Self-pay

## 2017-11-24 ENCOUNTER — Encounter: Payer: Self-pay | Admitting: Internal Medicine

## 2017-11-24 VITALS — BP 95/60 | HR 82 | Temp 98.6°F | Resp 20 | Ht 68.0 in | Wt 224.0 lb

## 2017-11-24 DIAGNOSIS — D120 Benign neoplasm of cecum: Secondary | ICD-10-CM

## 2017-11-24 DIAGNOSIS — D123 Benign neoplasm of transverse colon: Secondary | ICD-10-CM

## 2017-11-24 DIAGNOSIS — Z86010 Personal history of colonic polyps: Secondary | ICD-10-CM | POA: Diagnosis not present

## 2017-11-24 DIAGNOSIS — D122 Benign neoplasm of ascending colon: Secondary | ICD-10-CM | POA: Diagnosis not present

## 2017-11-24 DIAGNOSIS — Z1211 Encounter for screening for malignant neoplasm of colon: Secondary | ICD-10-CM | POA: Diagnosis not present

## 2017-11-24 DIAGNOSIS — I2510 Atherosclerotic heart disease of native coronary artery without angina pectoris: Secondary | ICD-10-CM | POA: Diagnosis not present

## 2017-11-24 MED ORDER — SODIUM CHLORIDE 0.9 % IV SOLN: 500.0000 mL | Freq: Once | INTRAVENOUS | Status: DC

## 2017-11-24 NOTE — Progress Notes (Signed)
Report given to PACU, vss 

## 2017-11-24 NOTE — Progress Notes (Signed)
Called to room to assist during endoscopic procedure.  Patient ID and intended procedure confirmed with present staff. Received instructions for my participation in the procedure from the performing physician.  

## 2017-11-24 NOTE — Op Note (Addendum)
[place] Patient Name: Travis Howell Procedure Date: 11/24/2017 1:34 PM MRN: [id number] Endoscopist: Jerene Bears , MD Age: 67 Referring MD:  Date of Birth: 11/09/1950 Gender: Male Account #: [id number] Procedure:                Colonoscopy Indications:              High risk colon cancer surveillance: Personal                            history of adenoma with villous component, Last                            colonoscopy: 2014 Medicines:                Monitored Anesthesia Care Procedure:                Pre-Anesthesia Assessment:                           - Prior to the procedure, a History and Physical                            was performed, and patient medications and                            allergies were reviewed. The patient's tolerance of                            previous anesthesia was also reviewed. The risks                            and benefits of the procedure and the sedation                            options and risks were discussed with the patient.                            All questions were answered, and informed consent                            was obtained. Prior Anticoagulants: The patient has                            taken no previous anticoagulant or antiplatelet                            agents. ASA Grade Assessment: III - A patient with                            severe systemic disease. After reviewing the risks                            and benefits, the patient was deemed in  satisfactory condition to undergo the procedure.                           After obtaining informed consent, the colonoscope                            was passed under direct vision. Throughout the                            procedure, the patient's blood pressure, pulse, and                            oxygen saturations were monitored continuously. The                            Model CF-HQ190L [id number]) scope was  introduced                            through the anus and advanced to the the cecum,                            identified by appendiceal orifice and ileocecal                            valve. The colonoscopy was performed without                            difficulty. The patient tolerated the procedure                            well. The quality of the bowel preparation was                            good. The ileocecal valve, appendiceal orifice, and                            rectum were photographed. Scope In: 1:39:34 PM Scope Out: 2:00:01 PM Scope Withdrawal Time: 0 hours 16 minutes 34 seconds  Total Procedure Duration: 0 hours 20 minutes 27 seconds  Findings:                 The digital rectal exam was normal.                           A 3 mm polyp was found in the cecum. The polyp was                            sessile. The polyp was removed with a cold snare.                            Resection and retrieval were complete.                           A 5 mm polyp was found in the ascending colon.  The                            polyp was sessile. The polyp was removed with a                            cold snare. Resection and retrieval were complete.                           A 5 mm polyp was found in the transverse colon. The                            polyp was sessile. The polyp was removed with a                            cold snare. Resection and retrieval were complete.                           Multiple small-mouthed diverticula were found in                            the sigmoid colon.                           Internal hemorrhoids were found during                            retroflexion. The hemorrhoids were small. Complications:            No immediate complications. Estimated Blood Loss:     Estimated blood loss was minimal. Impression:               - One 3 mm polyp in the cecum, removed with a cold                            snare. Resected and retrieved.                            - One 5 mm polyp in the ascending colon, removed                            with a cold snare. Resected and retrieved.                           - One 5 mm polyp in the transverse colon, removed                            with a cold snare. Resected and retrieved.                           - Diverticulosis in the sigmoid colon.                           - Small internal hemorrhoids. Recommendation:           -  Patient has a contact number available for                            emergencies. The signs and symptoms of potential                            delayed complications were discussed with the                            patient. Return to normal activities tomorrow.                            Written discharge instructions were provided to the                            patient.                           - Resume previous diet.                           - Continue present medications.                           - Await pathology results.                           - Repeat colonoscopy is recommended for                            surveillance. The colonoscopy date will be                            determined after pathology results from today's                            exam become available for review. Jerene Bears, MD 11/24/2017 2:03:47 PM This report has been signed electronically.

## 2017-11-24 NOTE — Patient Instructions (Signed)
YOU HAD AN ENDOSCOPIC PROCEDURE TODAY AT [facility]:   Refer to the procedure report that was given to you for any specific questions about what was found during the examination.  If the procedure report does not answer your questions, please call your gastroenterologist to clarify.  If you requested that your care partner not be given the details of your procedure findings, then the procedure report has been included in a sealed envelope for you to review at your convenience later.  YOU SHOULD EXPECT: Some feelings of bloating in the abdomen. Passage of more gas than usual.  Walking can help get rid of the air that was put into your GI tract during the procedure and reduce the bloating. If you had a lower endoscopy (such as a colonoscopy or flexible sigmoidoscopy) you may notice spotting of blood in your stool or on the toilet paper. If you underwent a bowel prep for your procedure, you may not have a normal bowel movement for a few days.  Please Note:  You might notice some irritation and congestion in your nose or some drainage.  This is from the oxygen used during your procedure.  There is no need for concern and it should clear up in a day or so.  SYMPTOMS TO REPORT IMMEDIATELY:   Following lower endoscopy (colonoscopy or flexible sigmoidoscopy):  Excessive amounts of blood in the stool  Significant tenderness or worsening of abdominal pains  Swelling of the abdomen that is new, acute  Fever of 100F or higher   For urgent or emergent issues, a gastroenterologist can be reached at any hour by calling [phone].   DIET:  We do recommend a small meal at first, but then you may proceed to your regular diet.  Drink plenty of fluids but you should avoid alcoholic beverages for 24 hours.  ACTIVITY:  You should plan to take it easy for the rest of today and you should NOT DRIVE or use heavy machinery until tomorrow (because of the sedation medicines used during the test).     FOLLOW UP: Our staff will call the number listed on your records the next business day following your procedure to check on you and address any questions or concerns that you may have regarding the information given to you following your procedure. If we do not reach you, we will leave a message.  However, if you are feeling well and you are not experiencing any problems, there is no need to return our call.  We will assume that you have returned to your regular daily activities without incident.  If any biopsies were taken you will be contacted by phone or by letter within the next 1-3 weeks.  Please call us at [phone] if you have not heard about the biopsies in 3 weeks.    SIGNATURES/CONFIDENTIALITY: You and/or your care partner have signed paperwork which will be entered into your electronic medical record.  These signatures attest to the fact that that the information above on your After Visit Summary has been reviewed and is understood.  Full responsibility of the confidentiality of this discharge information lies with you and/or your care-partner.  Polyp, diverticulosis, and polyp information given.

## 2017-11-25 ENCOUNTER — Telehealth: Payer: Self-pay

## 2017-11-25 NOTE — Telephone Encounter (Signed)
  Follow up Call-  Call back number 11/24/2017  Post procedure Call Back phone  # [phone]  Permission to leave phone message Yes  Some recent data might be hidden     Patient questions:  Do you have a fever, pain , or abdominal swelling? No. Pain Score  0 *  Have you tolerated food without any problems? Yes.    Have you been able to return to your normal activities? Yes.    Do you have any questions about your discharge instructions: Diet   No. Medications  No. Follow up visit  No.  Do you have questions or concerns about your Care? No.  Actions: * If pain score is 4 or above: No action needed, pain <4.  Patient notified that he left his inhaler at endoscopy center, Patient told nurse to discard inhaler, he will order a new one, B.Ejay Lashley RN.

## 2017-11-30 ENCOUNTER — Encounter: Payer: Self-pay | Admitting: Internal Medicine

## 2017-12-04 IMAGING — CT CT SHOULDER*R* W/O CM
3 series · 8 of 14 positions shown, 9 images · non-contrast
Comparison: None.

CLINICAL DATA: Right shoulder pain for 2.5 years. No known injury.
pre-op for right shoulder surgery.

EXAM:
CT OF THE RIGHT SHOULDER WITHOUT CONTRAST
TECHNIQUE: Multidetector CT imaging was performed according to the standard
protocol. Multiplanar CT image reconstructions were also generated.

[Series 5: shoulder soft · axial · 0.52mm/px · z∈[-170,-108]mm · 2 of 77 slices shown, 3 images]
[im 26/77  soft-tissue]
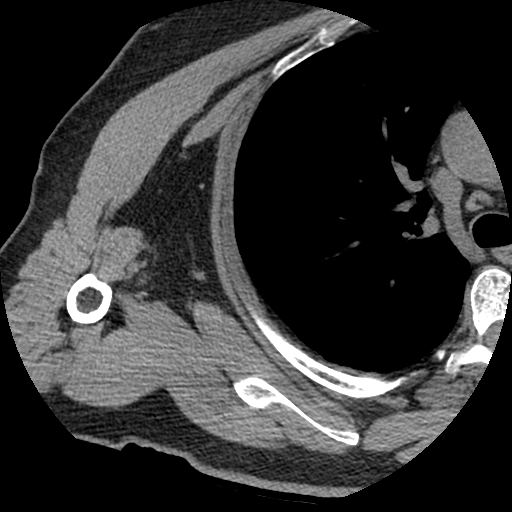
[im 26/77  bone]
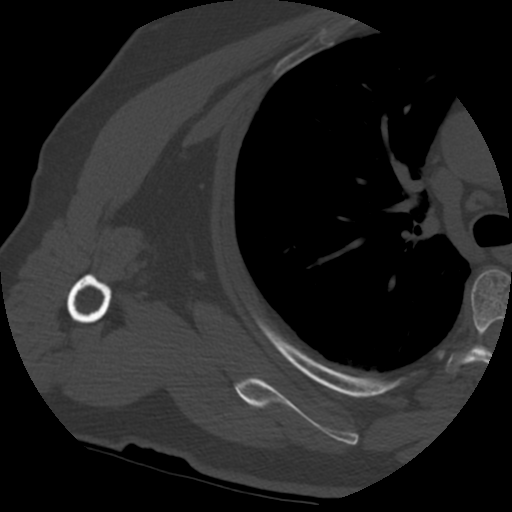
[im 51/77  bone]
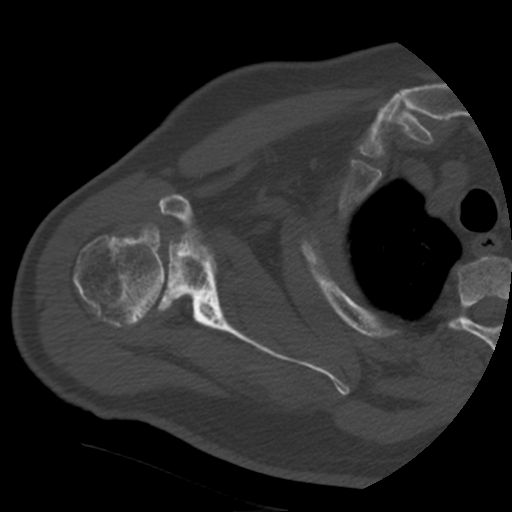

[Series 300: sagittal soft · sagittal · 0.44mm/px · 3 of 93 slices shown]
[im 24/93  soft-tissue]
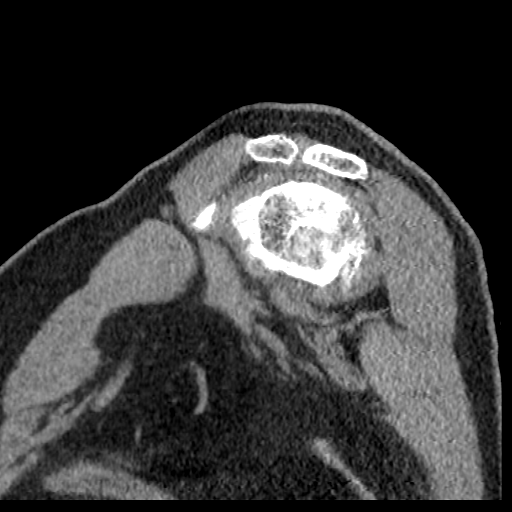
[im 47/93  soft-tissue]
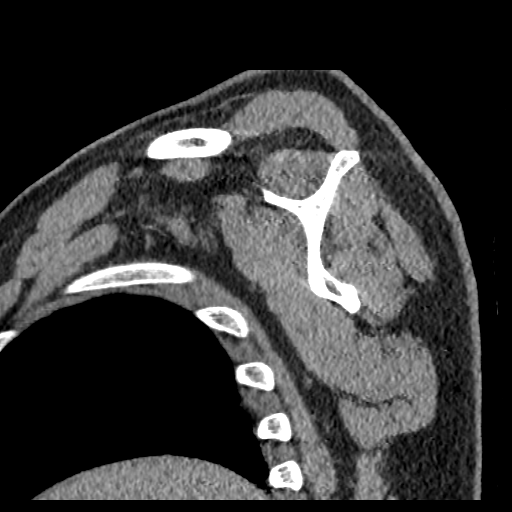
[im 70/93  soft-tissue]
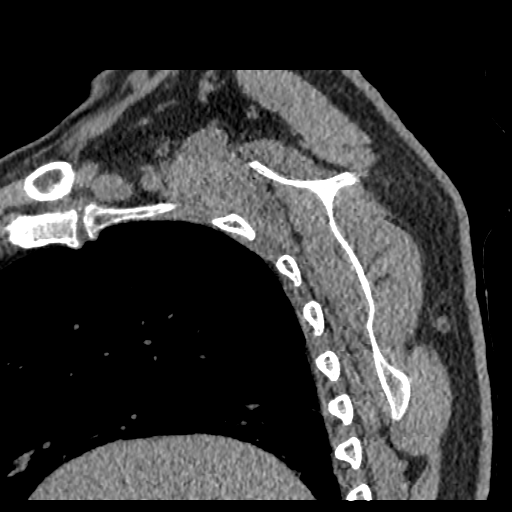

[Series 301: coronal soft · coronal · 0.44mm/px · 3 of 93 slices shown]
[im 24/93  soft-tissue]
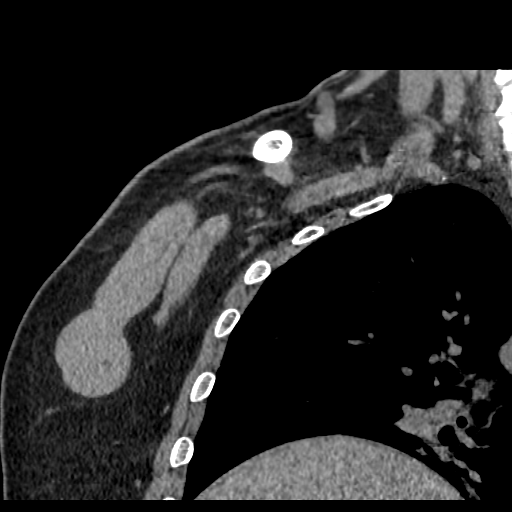
[im 47/93  soft-tissue]
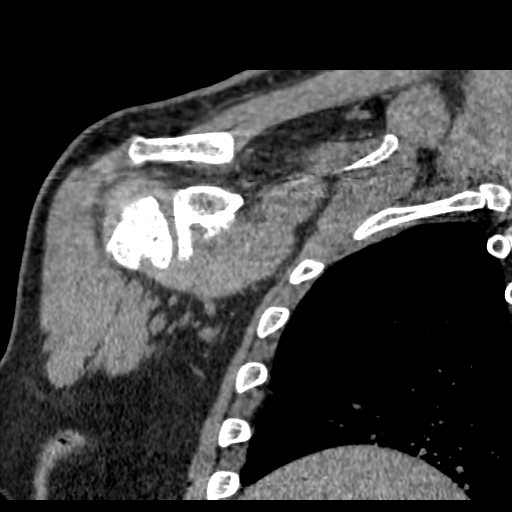
[im 70/93  soft-tissue]
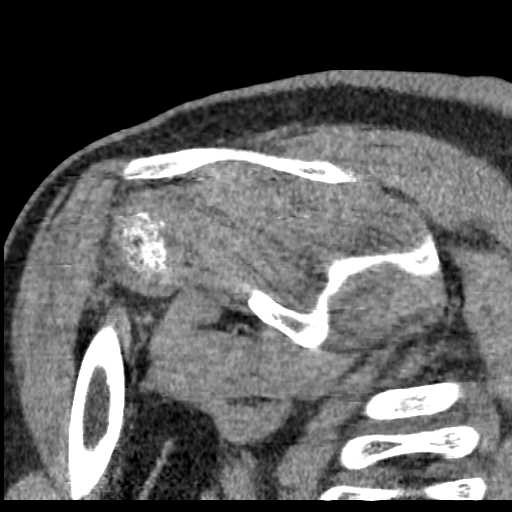

[8 of 14 positions shown; findings below may reference images not displayed]

FINDINGS: Bones/Joint/Cartilage

No evidence of acute fracture or dislocation. There are advanced
glenohumeral degenerative changes with prominent osteophytes of the
humeral head. There is subchondral cyst formation within the humeral
head and glenoid. Relative to the glenoid, there is posterior
subluxation of the humeral head and narrowing of the subacromial
space. There is a small shoulder joint effusion with probable small
loose bodies posteriorly in the joint. Minimal acromioclavicular
degenerative changes are present.

Ligaments

Not relevant for exam/indication.

Muscles and Tendons
No focal rotator cuff muscular atrophy demonstrated. The rotator
cuff itself and biceps tendon are not well evaluated by CT. There is
narrowing of the subacromial space with possible resulting rotator
cuff impingement.

Soft Tissues
No periarticular soft tissue edema or focal fluid collection
identified. There is linear scarring or atelectasis in the right
lung base. Mild aortic and great vessel atherosclerosis noted.
IMPRESSION: 1. Age advanced glenohumeral degenerative changes with probable
small loose bodies.
2. Narrowing of the subacromial space with potential rotator cuff
impingement. No focal muscular atrophy demonstrated.

## 2017-12-08 ENCOUNTER — Encounter: Payer: Self-pay | Admitting: Family Medicine

## 2018-03-03 DIAGNOSIS — H0102B Squamous blepharitis left eye, upper and lower eyelids: Secondary | ICD-10-CM | POA: Diagnosis not present

## 2018-03-03 DIAGNOSIS — Z961 Presence of intraocular lens: Secondary | ICD-10-CM | POA: Diagnosis not present

## 2018-03-03 DIAGNOSIS — H0102A Squamous blepharitis right eye, upper and lower eyelids: Secondary | ICD-10-CM | POA: Diagnosis not present

## 2018-03-03 DIAGNOSIS — H26492 Other secondary cataract, left eye: Secondary | ICD-10-CM | POA: Diagnosis not present

## 2018-03-24 ENCOUNTER — Telehealth: Payer: Self-pay

## 2018-03-24 DIAGNOSIS — I119 Hypertensive heart disease without heart failure: Secondary | ICD-10-CM

## 2018-03-24 DIAGNOSIS — E782 Mixed hyperlipidemia: Secondary | ICD-10-CM

## 2018-03-24 DIAGNOSIS — I252 Old myocardial infarction: Secondary | ICD-10-CM

## 2018-03-24 MED ORDER — ISOSORBIDE MONONITRATE ER 30 MG PO TB24: 30.0000 mg | ORAL_TABLET | Freq: Every day | ORAL | 3 refills | Status: DC

## 2018-03-24 NOTE — Telephone Encounter (Signed)
   Primary Cardiologist:  Larae Grooms, MD   Appointment Cancelled due to Coronavirus:  Called patient in regards to yearly f/u appointment with Dr. Irish Lack on 03/28/18.   Patient denies having any chest pain, SOB, increasing edema, wt gain, or increase in abdominal girth, syncope, or any other Sx.  Patient was okay with cancelling appointment and has been made aware that they will be contacted in the near future to reschedule.   Patient understands to let us know if they develop any Sx before then.  No refills needed.   Will route to "CV DIV C19 CANCEL" pool.         Travis Howell

## 2018-03-28 ENCOUNTER — Ambulatory Visit: Payer: Medicare Other | Admitting: Interventional Cardiology

## 2018-04-08 NOTE — Telephone Encounter (Signed)
Virtual Visit Pre-Appointment Phone Call    TELEPHONE CALL NOTE  PREM COYKENDALL has been deemed a candidate for a follow-up tele-health visit to limit community exposure during the Covid-19 pandemic. I spoke with the patient via phone to ensure availability of phone/video source, confirm preferred email & phone number, and discuss instructions and expectations.  I reminded RAMESES OU to be prepared with any vital sign and/or heart rhythm information that could potentially be obtained via home monitoring, at the time of his visit. I reminded DAMONEY JULIA to expect a phone call at the time of his visit if his visit.  Did the patient verbally acknowledge consent to treatment? YES  Appointment made for patient to have VIDEO Visit with Dr. Irish Lack on 4/6  Cleon Gustin, [place] 04/08/2018 3:42 PM   DOWNLOADING [place], go to [employer] and type in WebEx in the search bar. [place] [employer], the blue/green circle. The app is free but as with any other app downloads, their phone may require them to verify saved payment information or Apple password. The patient does NOT have to create an account.  - If Android, ask patient to go to [employer] and type in WebEx in the search bar. [place] [employer], the blue/green circle. The app is free but as with any other app downloads, their phone may require them to verify saved payment information or Android password. The patient does NOT have to create an account.   CONSENT FOR TELE-HEALTH VISIT - PLEASE REVIEW  I hereby voluntarily request, consent and authorize [facility] and its employed or contracted physicians, physician assistants, nurse practitioners or other licensed health care professionals (the Practitioner), to provide me with telemedicine health care services (the "Services") as deemed necessary by the treating Practitioner. I acknowledge and consent to  receive the Services by the Practitioner via telemedicine. I understand that the telemedicine visit will involve communicating with the Practitioner through live audiovisual communication technology and the disclosure of certain medical information by electronic transmission. I acknowledge that I have been given the opportunity to request an in-person assessment or other available alternative prior to the telemedicine visit and am voluntarily participating in the telemedicine visit.  I understand that I have the right to withhold or withdraw my consent to the use of telemedicine in the course of my care at any time, without affecting my right to future care or treatment, and that the Practitioner or I may terminate the telemedicine visit at any time. I understand that I have the right to inspect all information obtained and/or recorded in the course of the telemedicine visit and may receive copies of available information for a reasonable fee.  I understand that some of the potential risks of receiving the Services via telemedicine include:  Marland Kitchen Delay or interruption in medical evaluation due to technological equipment failure or disruption; . Information transmitted may not be sufficient (e.g. poor resolution of images) to allow for appropriate medical decision making by the Practitioner; and/or  . In rare instances, security protocols could fail, causing a breach of personal health information.  Furthermore, I acknowledge that it is my responsibility to provide information about my medical history, conditions and care that is complete and accurate to the best of my ability. I acknowledge that Practitioner's advice, recommendations, and/or decision may be based on factors not within their control, such as incomplete or inaccurate data provided by me  or distortions of diagnostic images or specimens that may result from electronic transmissions. I understand that the practice of medicine is not an exact science  and that Practitioner makes no warranties or guarantees regarding treatment outcomes. I acknowledge that I will receive a copy of this consent concurrently upon execution via email to the email address I last provided but may also request a printed copy by calling the office of [place].    I understand that my insurance will be billed for this visit.   I have read or had this consent read to me. . I understand the contents of this consent, which adequately explains the benefits and risks of the Services being provided via telemedicine.  . I have been provided ample opportunity to ask questions regarding this consent and the Services and have had my questions answered to my satisfaction. . I give my informed consent for the services to be provided through the use of telemedicine in my medical care  By participating in this telemedicine visit I agree to the above.
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
## 2018-04-11 ENCOUNTER — Other Ambulatory Visit: Payer: Self-pay

## 2018-04-11 ENCOUNTER — Encounter: Payer: Self-pay | Admitting: Interventional Cardiology

## 2018-04-11 ENCOUNTER — Telehealth (INDEPENDENT_AMBULATORY_CARE_PROVIDER_SITE_OTHER): Payer: Medicare Other | Admitting: Interventional Cardiology

## 2018-04-11 VITALS — BP 161/91 | HR 93 | Ht 68.0 in | Wt 233.0 lb

## 2018-04-11 DIAGNOSIS — I2510 Atherosclerotic heart disease of native coronary artery without angina pectoris: Secondary | ICD-10-CM

## 2018-04-11 DIAGNOSIS — I252 Old myocardial infarction: Secondary | ICD-10-CM | POA: Diagnosis not present

## 2018-04-11 DIAGNOSIS — E782 Mixed hyperlipidemia: Secondary | ICD-10-CM

## 2018-04-11 DIAGNOSIS — I119 Hypertensive heart disease without heart failure: Secondary | ICD-10-CM | POA: Diagnosis not present

## 2018-04-11 NOTE — Patient Instructions (Signed)
Medication Instructions:  Your physician recommends that you continue on your current medications as directed. Please refer to the Current Medication list given to you today.  If you need a refill on your cardiac medications before your next appointment, please call your pharmacy.   Lab work: None Ordered  If you have labs (blood work) drawn today and your tests are completely normal, you will receive your results only by: Marland Kitchen MyChart Message (if you have MyChart) OR . A paper copy in the mail If you have any lab test that is abnormal or we need to change your treatment, we will call you to review the results.  Testing/Procedures: None ordered  Follow-Up: At [facility], you and your health needs are our priority.  As part of our continuing mission to provide you with exceptional heart care, we have created designated Provider Care Teams.  These Care Teams include your primary Cardiologist (physician) and Advanced Practice Providers (APPs -  Physician Assistants and Nurse Practitioners) who all work together to provide you with the care you need, when you need it. . You will need a follow up appointment in 1 year.  Please call our office 2 months in advance to schedule this appointment.  You may see Casandra Doffing, MD or one of the following Advanced Practice Providers on your designated Care Team:   . Lyda Jester, PA-C . Dayna Dunn, PA-C . Ermalinda Barrios, PA-C  Any Other Special Instructions Will Be Listed Below (If Applicable).  1. Monitor your Blood Pressure and let us know if it becomes elevated  2. CALL [facility] AND FIND OUT WHAT MEDICINE WAS CALLED IN AND WHAT DOSE IT WAS AND SEND [name] MESSAGE TO LET us KNOW

## 2018-04-12 NOTE — Telephone Encounter (Signed)
New Message   Pt c/o medication issue:  1. Name of Medication: Isosorbide   2. How are you currently taking this medication (dosage and times per day)? 30mg  once a day   3. Are you having a reaction (difficulty breathing--STAT)? NO  4. What is your medication issue? Patient's wife calling in states they don't remember Dr. Irish Lack ordering this medication and wants to speak with [place] to comfirm if the doctor wants him to take them.

## 2018-04-14 NOTE — Telephone Encounter (Signed)
Called and spoke to patient's wife. Patient is not having any chest pain. Made them aware that the patient does not need to take imdur and to let us know if there are any changes in his symptoms.

## 2018-06-01 DIAGNOSIS — M1712 Unilateral primary osteoarthritis, left knee: Secondary | ICD-10-CM | POA: Diagnosis not present

## 2018-06-01 DIAGNOSIS — M1711 Unilateral primary osteoarthritis, right knee: Secondary | ICD-10-CM | POA: Diagnosis not present

## 2018-06-27 DIAGNOSIS — M1712 Unilateral primary osteoarthritis, left knee: Secondary | ICD-10-CM | POA: Diagnosis not present

## 2018-06-27 DIAGNOSIS — M1711 Unilateral primary osteoarthritis, right knee: Secondary | ICD-10-CM | POA: Diagnosis not present

## 2018-07-04 DIAGNOSIS — M1712 Unilateral primary osteoarthritis, left knee: Secondary | ICD-10-CM | POA: Diagnosis not present

## 2018-07-04 DIAGNOSIS — M1711 Unilateral primary osteoarthritis, right knee: Secondary | ICD-10-CM | POA: Diagnosis not present

## 2018-07-11 DIAGNOSIS — M1712 Unilateral primary osteoarthritis, left knee: Secondary | ICD-10-CM | POA: Diagnosis not present

## 2018-07-11 DIAGNOSIS — M1711 Unilateral primary osteoarthritis, right knee: Secondary | ICD-10-CM | POA: Diagnosis not present

## 2018-07-13 ENCOUNTER — Telehealth: Payer: Self-pay | Admitting: Family Medicine

## 2018-07-13 NOTE — Telephone Encounter (Signed)
Patient's Wife called to find out if we have the patient's blood type in his chart or where they could go to find this information out   C/B #  [id number]

## 2018-07-13 NOTE — Telephone Encounter (Signed)
Left message on vm per dpr informing pt we would not have that info in his chart.  However, if he has donated blood, he can check with [employer] [facility].  Also, if he has had a surgery, he can contact the location where it was done.

## 2018-10-12 ENCOUNTER — Other Ambulatory Visit: Payer: Self-pay | Admitting: Family Medicine

## 2018-10-12 DIAGNOSIS — E119 Type 2 diabetes mellitus without complications: Secondary | ICD-10-CM

## 2018-10-12 DIAGNOSIS — N400 Enlarged prostate without lower urinary tract symptoms: Secondary | ICD-10-CM

## 2018-10-12 DIAGNOSIS — I2510 Atherosclerotic heart disease of native coronary artery without angina pectoris: Secondary | ICD-10-CM

## 2018-10-12 DIAGNOSIS — E1169 Type 2 diabetes mellitus with other specified complication: Secondary | ICD-10-CM

## 2018-10-13 ENCOUNTER — Other Ambulatory Visit: Payer: Self-pay | Admitting: Family Medicine

## 2018-10-13 ENCOUNTER — Other Ambulatory Visit (INDEPENDENT_AMBULATORY_CARE_PROVIDER_SITE_OTHER): Payer: Medicare Other

## 2018-10-13 DIAGNOSIS — E1169 Type 2 diabetes mellitus with other specified complication: Secondary | ICD-10-CM | POA: Diagnosis not present

## 2018-10-13 DIAGNOSIS — N400 Enlarged prostate without lower urinary tract symptoms: Secondary | ICD-10-CM

## 2018-10-13 DIAGNOSIS — E119 Type 2 diabetes mellitus without complications: Secondary | ICD-10-CM

## 2018-10-13 DIAGNOSIS — E785 Hyperlipidemia, unspecified: Secondary | ICD-10-CM | POA: Diagnosis not present

## 2018-10-13 LAB — COMPREHENSIVE METABOLIC PANEL
ALT: 16 U/L (ref 0–53)
AST: 20 U/L (ref 0–37)
Albumin: 4.5 g/dL (ref 3.5–5.2)
Alkaline Phosphatase: 56 U/L (ref 39–117)
BUN: 14 mg/dL (ref 6–23)
CO2: 31 mEq/L (ref 19–32)
Calcium: 9.2 mg/dL (ref 8.4–10.5)
Chloride: 101 mEq/L (ref 96–112)
Creatinine, Ser: 0.74 mg/dL (ref 0.40–1.50)
GFR: 105.49 mL/min (ref >60.00)
Glucose, Bld: 120 mg/dL — ABNORMAL HIGH (ref 70–99)
Potassium: 4.2 mEq/L (ref 3.5–5.1)
Sodium: 139 mEq/L (ref 135–145)
Total Bilirubin: 0.6 mg/dL (ref 0.2–1.2)
Total Protein: 7.2 g/dL (ref 6.0–8.3)

## 2018-10-13 LAB — LIPID PANEL
Cholesterol: 117 mg/dL (ref 0–200)
HDL: 45.7 mg/dL (ref >39.00)
LDL Cholesterol: 59 mg/dL (ref 0–99)
NonHDL: 70.91
Total CHOL/HDL Ratio: 3
Triglycerides: 62 mg/dL (ref 0.0–149.0)
VLDL: 12.4 mg/dL (ref 0.0–40.0)

## 2018-10-13 LAB — HEMOGLOBIN A1C: Hgb A1c MFr Bld: 6.9 % — ABNORMAL HIGH (ref 4.6–6.5)

## 2018-10-13 LAB — PSA: PSA: 1.21 ng/mL (ref 0.10–4.00)

## 2018-10-19 ENCOUNTER — Ambulatory Visit (INDEPENDENT_AMBULATORY_CARE_PROVIDER_SITE_OTHER): Payer: Medicare Other

## 2018-10-19 ENCOUNTER — Ambulatory Visit (INDEPENDENT_AMBULATORY_CARE_PROVIDER_SITE_OTHER): Payer: Medicare Other | Admitting: Family Medicine

## 2018-10-19 ENCOUNTER — Ambulatory Visit (INDEPENDENT_AMBULATORY_CARE_PROVIDER_SITE_OTHER)
Admission: RE | Admit: 2018-10-19 | Discharge: 2018-10-19 | Disposition: A | Discharge status: Home / Self Care | Payer: Medicare Other | Source: Ambulatory Visit | Attending: Family Medicine | Admitting: Family Medicine

## 2018-10-19 ENCOUNTER — Other Ambulatory Visit: Payer: Self-pay

## 2018-10-19 ENCOUNTER — Encounter: Payer: Self-pay | Admitting: Family Medicine

## 2018-10-19 ENCOUNTER — Ambulatory Visit: Payer: Medicare Other

## 2018-10-19 VITALS — BP 122/67 | HR 78 | Temp 97.8°F | Ht 67.0 in | Wt 234.1 lb

## 2018-10-19 DIAGNOSIS — R0989 Other specified symptoms and signs involving the circulatory and respiratory systems: Secondary | ICD-10-CM | POA: Diagnosis not present

## 2018-10-19 DIAGNOSIS — J984 Other disorders of lung: Secondary | ICD-10-CM

## 2018-10-19 DIAGNOSIS — L989 Disorder of the skin and subcutaneous tissue, unspecified: Secondary | ICD-10-CM | POA: Diagnosis not present

## 2018-10-19 DIAGNOSIS — E785 Hyperlipidemia, unspecified: Secondary | ICD-10-CM | POA: Diagnosis not present

## 2018-10-19 DIAGNOSIS — F418 Other specified anxiety disorders: Secondary | ICD-10-CM

## 2018-10-19 DIAGNOSIS — Z23 Encounter for immunization: Secondary | ICD-10-CM

## 2018-10-19 DIAGNOSIS — E119 Type 2 diabetes mellitus without complications: Secondary | ICD-10-CM | POA: Diagnosis not present

## 2018-10-19 DIAGNOSIS — E1169 Type 2 diabetes mellitus with other specified complication: Secondary | ICD-10-CM

## 2018-10-19 DIAGNOSIS — D492 Neoplasm of unspecified behavior of bone, soft tissue, and skin: Secondary | ICD-10-CM | POA: Diagnosis not present

## 2018-10-19 DIAGNOSIS — I2510 Atherosclerotic heart disease of native coronary artery without angina pectoris: Secondary | ICD-10-CM | POA: Diagnosis not present

## 2018-10-19 DIAGNOSIS — Z0000 Encounter for general adult medical examination without abnormal findings: Secondary | ICD-10-CM

## 2018-10-19 DIAGNOSIS — N402 Nodular prostate without lower urinary tract symptoms: Secondary | ICD-10-CM | POA: Diagnosis not present

## 2018-10-19 DIAGNOSIS — I10 Essential (primary) hypertension: Secondary | ICD-10-CM

## 2018-10-19 DIAGNOSIS — N400 Enlarged prostate without lower urinary tract symptoms: Secondary | ICD-10-CM | POA: Diagnosis not present

## 2018-10-19 DIAGNOSIS — Z7189 Other specified counseling: Secondary | ICD-10-CM

## 2018-10-19 MED ORDER — ALBUTEROL SULFATE HFA 108 (90 BASE) MCG/ACT IN AERS: 2.0000 | INHALATION_SPRAY | Freq: Four times a day (QID) | RESPIRATORY_TRACT | 3 refills | Status: DC | PRN

## 2018-10-19 MED ORDER — POTASSIUM 99 MG PO TABS: 1.0000 | ORAL_TABLET | Freq: Every day | ORAL | Status: DC

## 2018-10-19 MED ORDER — ROSUVASTATIN CALCIUM 5 MG PO TABS: 5.0000 mg | ORAL_TABLET | Freq: Every day | ORAL | 3 refills | Status: DC

## 2018-10-19 MED ORDER — EZETIMIBE 10 MG PO TABS: 10.0000 mg | ORAL_TABLET | Freq: Every day | ORAL | 3 refills | Status: DC

## 2018-10-19 MED ORDER — SILODOSIN 8 MG PO CAPS: 8.0000 mg | ORAL_CAPSULE | Freq: Every day | ORAL | 3 refills | Status: DC

## 2018-10-19 MED ORDER — FLUOXETINE HCL 10 MG PO CAPS: 10.0000 mg | ORAL_CAPSULE | Freq: Every day | ORAL | 3 refills | Status: DC

## 2018-10-19 NOTE — Assessment & Plan Note (Signed)
New - will refer to urology for further evaluation. Lab Results  Component Value Date   PSA 1.21 10/13/2018   PSA 0.50 10/11/2017   PSA 0.25 02/12/2017
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
## 2018-10-19 NOTE — Assessment & Plan Note (Signed)
Weight gain noted. Encouraged healthy diet and lifestyle changes to affect sustainable weight loss.  

## 2018-10-19 NOTE — Patient Instructions (Addendum)
Flu shot, pneumovax today.  Xray today. Medicines refilled today.  Liquid nitrogen therapy applied to skin growth on abdomen. Return if streaking redness, or if not fully resolved after 2 weeks.  Try funginail nail laquer over the counter at local pharmacy - use for 6-12 months then we will reassess next visit.  Watch added sugars, work on weight loss.  Return in 6 months for diabetes check.  For possible prostate nodule - we will refer you to urology for further evaluation.   Health Maintenance After Age 36 After age 39, you are at a higher risk for certain long-term diseases and infections as well as injuries from falls. Falls are a major cause of broken bones and head injuries in people who are older than age 21. Getting regular preventive care can help to keep you healthy and well. Preventive care includes getting regular testing and making lifestyle changes as recommended by your health care provider. Talk with your health care provider about:  Which screenings and tests you should have. A screening is a test that checks for a disease when you have no symptoms.  A diet and exercise plan that is right for you. What should I know about screenings and tests to prevent falls? Screening and testing are the best ways to find a health problem early. Early diagnosis and treatment give you the best chance of managing medical conditions that are common after age 63. Certain conditions and lifestyle choices may make you more likely to have a fall. Your health care provider may recommend:  Regular vision checks. Poor vision and conditions such as cataracts can make you more likely to have a fall. If you wear glasses, make sure to get your prescription updated if your vision changes.  Medicine review. Work with your health care provider to regularly review all of the medicines you are taking, including over-the-counter medicines. Ask your health care provider about any side effects that may make you more  likely to have a fall. Tell your health care provider if any medicines that you take make you feel dizzy or sleepy.  Osteoporosis screening. Osteoporosis is a condition that causes the bones to get weaker. This can make the bones weak and cause them to break more easily.  Blood pressure screening. Blood pressure changes and medicines to control blood pressure can make you feel dizzy.  Strength and balance checks. Your health care provider may recommend certain tests to check your strength and balance while standing, walking, or changing positions.  Foot health exam. Foot pain and numbness, as well as not wearing proper footwear, can make you more likely to have a fall.  Depression screening. You may be more likely to have a fall if you have a fear of falling, feel emotionally low, or feel unable to do activities that you used to do.  Alcohol use screening. Using too much alcohol can affect your balance and may make you more likely to have a fall. What actions can I take to lower my risk of falls? General instructions  Talk with your health care provider about your risks for falling. Tell your health care provider if: ? You fall. Be sure to tell your health care provider about all falls, even ones that seem minor. ? You feel dizzy, sleepy, or off-balance.  Take over-the-counter and prescription medicines only as told by your health care provider. These include any supplements.  Eat a healthy diet and maintain a healthy weight. A healthy diet includes low-fat dairy products,  low-fat (lean) meats, and fiber from whole grains, beans, and lots of fruits and vegetables. Home safety  Remove any tripping hazards, such as rugs, cords, and clutter.  Install safety equipment such as grab bars in bathrooms and safety rails on stairs.  Keep rooms and walkways well-lit. Activity   Follow a regular exercise program to stay fit. This will help you maintain your balance. Ask your health care provider  what types of exercise are appropriate for you.  If you need a cane or walker, use it as recommended by your health care provider.  Wear supportive shoes that have nonskid soles. Lifestyle  Do not drink alcohol if your health care provider tells you not to drink.  If you drink alcohol, limit how much you have: ? 0-1 drink a day for women. ? 0-2 drinks a day for men.  Be aware of how much alcohol is in your drink. In the [place] one drink equals one typical bottle of beer (12 oz), one-half glass of wine (5 oz), or one shot of hard liquor (1 oz).  Do not use any products that contain nicotine or tobacco, such as cigarettes and e-cigarettes. If you need help quitting, ask your health care provider. Summary  Having a healthy lifestyle and getting preventive care can help to protect your health and wellness after age 48.  Screening and testing are the best way to find a health problem early and help you avoid having a fall. Early diagnosis and treatment give you the best chance for managing medical conditions that are more common for people who are older than age 44.  Falls are a major cause of broken bones and head injuries in people who are older than age 59. Take precautions to prevent a fall at home.  Work with your health care provider to learn what changes you can make to improve your health and wellness and to prevent falls. This information is not intended to replace advice given to you by your health care provider. Make sure you discuss any questions you have with your health care provider. Document Released: 11/04/2016 Document Revised: 04/14/2018 Document Reviewed: 11/04/2016 Elsevier Patient Education  2020 [employer].

## 2018-10-19 NOTE — Patient Instructions (Signed)
Travis Howell , Thank you for taking time to come for your Medicare Wellness Visit. I appreciate your ongoing commitment to your health goals. Please review the following plan we discussed and let me know if I can assist you in the future.   Screening recommendations/referrals: Colonoscopy: up to date, completed 11/24/2017 Recommended yearly ophthalmology/optometry visit for glaucoma screening and checkup Recommended yearly dental visit for hygiene and checkup  Vaccinations: Influenza vaccine: will get in office today Pneumococcal vaccine: will get in office today  Tdap vaccine: up to date, completed 10/08/2016 Shingles vaccine: will check with pharmacy    Advanced directives: Please bring a copy of your POA (Power of [place]) and/or Living Will to your next appointment.   Conditions/risks identified: diabetes, hypertension, hyperlipidemia   Next appointment: 10/19/2018 @ 10:30 am   Preventive Care 16 Years and Older, Male Preventive care refers to lifestyle choices and visits with your health care provider that can promote health and wellness. What does preventive care include?  A yearly physical exam. This is also called an annual well check.  Dental exams once or twice a year.  Routine eye exams. Ask your health care provider how often you should have your eyes checked.  Personal lifestyle choices, including:  Daily care of your teeth and gums.  Regular physical activity.  Eating a healthy diet.  Avoiding tobacco and drug use.  Limiting alcohol use.  Practicing safe sex.  Taking low doses of aspirin every day.  Taking vitamin and mineral supplements as recommended by your health care provider. What happens during an annual well check? The services and screenings done by your health care provider during your annual well check will depend on your age, overall health, lifestyle risk factors, and family history of disease. Counseling  Your health care provider may ask  you questions about your:  Alcohol use.  Tobacco use.  Drug use.  Emotional well-being.  Home and relationship well-being.  Sexual activity.  Eating habits.  History of falls.  Memory and ability to understand (cognition).  Work and work Statistician. Screening  You may have the following tests or measurements:  Height, weight, and BMI.  Blood pressure.  Lipid and cholesterol levels. These may be checked every 5 years, or more frequently if you are over 82 years old.  Skin check.  Lung cancer screening. You may have this screening every year starting at age 67 if you have a 30-pack-year history of smoking and currently smoke or have quit within the past 15 years.  Fecal occult blood test (FOBT) of the stool. You may have this test every year starting at age 48.  Flexible sigmoidoscopy or colonoscopy. You may have a sigmoidoscopy every 5 years or a colonoscopy every 10 years starting at age 63.  Prostate cancer screening. Recommendations will vary depending on your family history and other risks.  Hepatitis C blood test.  Hepatitis B blood test.  Sexually transmitted disease (STD) testing.  Diabetes screening. This is done by checking your blood sugar (glucose) after you have not eaten for a while (fasting). You may have this done every 1-3 years.  Abdominal aortic aneurysm (AAA) screening. You may need this if you are a current or former smoker.  Osteoporosis. You may be screened starting at age 54 if you are at high risk. Talk with your health care provider about your test results, treatment options, and if necessary, the need for more tests. Vaccines  Your health care provider may recommend certain vaccines, such as:  Influenza vaccine. This is recommended every year.  Tetanus, diphtheria, and acellular pertussis (Tdap, Td) vaccine. You may need a Td booster every 10 years.  Zoster vaccine. You may need this after age 19.  Pneumococcal 13-valent conjugate  (PCV13) vaccine. One dose is recommended after age 33.  Pneumococcal polysaccharide (PPSV23) vaccine. One dose is recommended after age 84. Talk to your health care provider about which screenings and vaccines you need and how often you need them. This information is not intended to replace advice given to you by your health care provider. Make sure you discuss any questions you have with your health care provider. Document Released: 01/18/2015 Document Revised: 09/11/2015 Document Reviewed: 10/23/2014 Elsevier Interactive Patient Education  2017 [employer] Prevention in the Home Falls can cause injuries. They can happen to people of all ages. There are many things you can do to make your home safe and to help prevent falls. What can I do on the outside of my home?  Regularly fix the edges of walkways and driveways and fix any cracks.  Remove anything that might make you trip as you walk through a door, such as a raised step or threshold.  Trim any bushes or trees on the path to your home.  Use bright outdoor lighting.  Clear any walking paths of anything that might make someone trip, such as rocks or tools.  Regularly check to see if handrails are loose or broken. Make sure that both sides of any steps have handrails.  Any raised decks and porches should have guardrails on the edges.  Have any leaves, snow, or ice cleared regularly.  Use sand or salt on walking paths during winter.  Clean up any spills in your garage right away. This includes oil or grease spills. What can I do in the bathroom?  Use night lights.  Install grab bars by the toilet and in the tub and shower. Do not use towel bars as grab bars.  Use non-skid mats or decals in the tub or shower.  If you need to sit down in the shower, use a plastic, non-slip stool.  Keep the floor dry. Clean up any water that spills on the floor as soon as it happens.  Remove soap buildup in the tub or shower  regularly.  Attach bath mats securely with double-sided non-slip rug tape.  Do not have throw rugs and other things on the floor that can make you trip. What can I do in the bedroom?  Use night lights.  Make sure that you have a light by your bed that is easy to reach.  Do not use any sheets or blankets that are too big for your bed. They should not hang down onto the floor.  Have a firm chair that has side arms. You can use this for support while you get dressed.  Do not have throw rugs and other things on the floor that can make you trip. What can I do in the kitchen?  Clean up any spills right away.  Avoid walking on wet floors.  Keep items that you use a lot in easy-to-reach places.  If you need to reach something above you, use a strong step stool that has a grab bar.  Keep electrical cords out of the way.  Do not use floor polish or wax that makes floors slippery. If you must use wax, use non-skid floor wax.  Do not have throw rugs and other things on the floor that  can make you trip. What can I do with my stairs?  Do not leave any items on the stairs.  Make sure that there are handrails on both sides of the stairs and use them. Fix handrails that are broken or loose. Make sure that handrails are as long as the stairways.  Check any carpeting to make sure that it is firmly attached to the stairs. Fix any carpet that is loose or worn.  Avoid having throw rugs at the top or bottom of the stairs. If you do have throw rugs, attach them to the floor with carpet tape.  Make sure that you have a light switch at the top of the stairs and the bottom of the stairs. If you do not have them, ask someone to add them for you. What else can I do to help prevent falls?  Wear shoes that:  Do not have high heels.  Have rubber bottoms.  Are comfortable and fit you well.  Are closed at the toe. Do not wear sandals.  If you use a stepladder:  Make sure that it is fully  opened. Do not climb a closed stepladder.  Make sure that both sides of the stepladder are locked into place.  Ask someone to hold it for you, if possible.  Clearly mark and make sure that you can see:  Any grab bars or handrails.  First and last steps.  Where the edge of each step is.  Use tools that help you move around (mobility aids) if they are needed. These include:  Canes.  Walkers.  Scooters.  Crutches.  Turn on the lights when you go into a dark area. Replace any light bulbs as soon as they burn out.  Set up your furniture so you have a clear path. Avoid moving your furniture around.  If any of your floors are uneven, fix them.  If there are any pets around you, be aware of where they are.  Review your medicines with your doctor. Some medicines can make you feel dizzy. This can increase your chance of falling. Ask your doctor what other things that you can do to help prevent falls. This information is not intended to replace advice given to you by your health care provider. Make sure you discuss any questions you have with your health care provider. Document Released: 10/18/2008 Document Revised: 05/30/2015 Document Reviewed: 01/26/2014 Elsevier Interactive Patient Education  2017 [employer].

## 2018-10-19 NOTE — Assessment & Plan Note (Signed)
Chronic, stable. Continue current regimen. 

## 2018-10-19 NOTE — Assessment & Plan Note (Signed)
Stable period on prozac - continue.

## 2018-10-19 NOTE — Progress Notes (Signed)
This visit was conducted in person.  BP 122/67 (BP Location: Left Arm, Patient Position: Sitting, Cuff Size: Large)   Pulse 78   Temp 97.8 F (36.6 C) (Temporal)   Ht 5\' 7"  (1.702 m)   Wt 234 lb 2 oz (106.2 kg)   SpO2 96%   BMI 36.67 kg/m    CC: AMW f/u visit Subjective:    Patient ID: Travis Howell, male    DOB: 06/13/1950, 68 y.o.   MRN: [id number]  HPI: Travis Howell is a 68 y.o. male presenting on 10/19/2018 for Medicare Wellness (Wants wart on chest checked. ) and Nail Problem (Thinks he may have toenail fungus. )   Saw health advisor today for medicare wellness visit. Note reviewed.   Ongoing intermittent L shoulder pain for months.   No exam data present    Clinical Support from 10/19/2018 in [place] at [facility] Total Score  0      Fall Risk  10/19/2018 11/24/2017 10/11/2017 10/09/2016 10/01/2015  Falls in the past year? 0 0 No Yes No  Comment - Emmi Telephone Survey: data to providers prior to load - - -  Number falls in past yr: - - - 1 -  Injury with Fall? - - - Yes -  Comment - - - fell off ladder, mechanical fall -  Risk for fall due to : Medication side effect - - - -  Follow up Falls evaluation completed;Falls prevention discussed - - - -      Wants spot on stomach checked - present for the past year. May be wart - would be interested in treatment.   Preventative: COLONOSCOPY 11/2017 - 1TA, diverticulosis, rpt 5 yrs ([place]) Prostate cancer screening - on rapaflo for BPH. Voiding well. Nocturia x1. Lung cancer screening - quit smoking 2011, 1 pack per week<20 PY hx.  Flu shotyearly Prevnar 2019, pneumovax today Tdap 2018 zostavax - 2014 shingrix - discussed.will check with pharmacy.  Advanced directive discussion - has at home, brought copy today which will be scanned. HCPOA would be GF Seat belt use discussed Sunscreen use discussed. No changing moles on skin.  Ex smoker - quit 2011. GF smokes indoors.  Alcohol -  intermittently. 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 Interim medical history since our last visit reviewed. Allergies and medications reviewed and updated. Outpatient Medications Prior to Visit  Medication Sig Dispense Refill  . amLODipine-valsartan (EXFORGE) 5-160 MG tablet Take 0.5 tablets by mouth daily.    Marland Kitchen aspirin 81 MG chewable tablet Chew 81 mg by mouth daily.     . Omega-3 Fatty Acids (FISH OIL) 1000 MG CAPS Take 4,000 mg by mouth daily.    Marland Kitchen albuterol (PROVENTIL HFA;VENTOLIN HFA) 108 (90 Base) MCG/ACT inhaler Inhale 2 puffs into the lungs every 6 (six) hours as needed for wheezing or shortness of breath. 1 Inhaler 3  . ezetimibe (ZETIA) 10 MG tablet TAKE 1 TABLET BY MOUTH  DAILY 90 tablet 0  . FLUoxetine (PROZAC) 10 MG capsule TAKE 1 CAPSULE BY MOUTH  DAILY 90 capsule 0  . Potassium 99 MG TABS Take 1 tablet by mouth every other day.     Marland Kitchen  rosuvastatin (CRESTOR) 5 MG tablet TAKE 1 TABLET BY MOUTH  DAILY 90 tablet 0  . silodosin (RAPAFLO) 8 MG CAPS capsule TAKE 1 CAPSULE BY MOUTH  DAILY WITH BREAKFAST 90 capsule 0   No facility-administered medications prior to visit.      Per HPI unless specifically indicated in ROS section below Review of Systems Objective:    BP 122/67 (BP Location: Left Arm, Patient Position: Sitting, Cuff Size: Large)   Pulse 78   Temp 97.8 F (36.6 C) (Temporal)   Ht 5\' 7"  (1.702 m)   Wt 234 lb 2 oz (106.2 kg)   SpO2 96%   BMI 36.67 kg/m   Wt Readings from Last 3 Encounters:  10/19/18 234 lb 2 oz (106.2 kg)  04/11/18 233 lb (105.7 kg)  11/24/17 224 lb (101.6 kg)    Physical Exam Vitals signs and nursing note  reviewed.  Constitutional:      General: He is not in acute distress.    Appearance: Normal appearance. He is well-developed. He is obese. He is not ill-appearing.  HENT:     Head: Normocephalic and atraumatic.     Right Ear: Hearing, tympanic membrane, ear canal and external ear normal.     Left Ear: Hearing, tympanic membrane, ear canal and external ear normal.     Nose: Nose normal.     Mouth/Throat:     Mouth: Mucous membranes are moist.     Pharynx: Oropharynx is clear. Uvula midline. No posterior oropharyngeal erythema.  Eyes:     General: No scleral icterus.    Extraocular Movements: Extraocular movements intact.     Conjunctiva/sclera: Conjunctivae normal.     Pupils: Pupils are equal, round, and reactive to light.  Neck:     Musculoskeletal: Normal range of motion and neck supple.     Vascular: No carotid bruit.  Cardiovascular:     Rate and Rhythm: Normal rate and regular rhythm.     Pulses: Normal pulses.          Radial pulses are 2+ on the right side and 2+ on the left side.     Heart sounds: Normal heart sounds. No murmur.  Pulmonary:     Effort: Pulmonary effort is normal. No respiratory distress.     Breath sounds: No wheezing, rhonchi or rales.     Comments: Diminished breath sounds RLL Abdominal:     General: Abdomen is flat. Bowel sounds are normal. There is no distension.     Palpations: Abdomen is soft. There is no mass.     Tenderness: There is no abdominal tenderness. There is no guarding or rebound.     Hernia: No hernia is present.  Genitourinary:    Prostate: Nodules present (posterior R lobe). Not enlarged (25gm) and not tender.     Rectum: Normal. No mass, tenderness, anal fissure, external hemorrhoid or internal hemorrhoid. Normal anal tone.  Musculoskeletal: Normal range of motion.     Comments: See below for diabetic foot exam  Lymphadenopathy:     Cervical: No cervical adenopathy.  Skin:    General: Skin is warm and dry.     Findings: Lesion  (verrucous exophytic skin growth L abdomen) present. No rash.  Neurological:     General: No focal deficit present.     Mental Status: He is alert and oriented to person, place, and time.     Comments:  CN grossly intact, station and gait intact Recall 3/3 Calculation 5/5 [id number]  Psychiatric:  Mood and Affect: Mood normal.        Behavior: Behavior normal.        Thought Content: Thought content normal.        Judgment: Judgment normal.    Diabetic Foot Exam - Simple   Simple Foot Form Diabetic Foot exam was performed with the following findings: Yes 10/19/2018 10:56 AM  Visual Inspection No deformities, no ulcerations, no other skin breakdown bilaterally: Yes Sensation Testing See comments: Yes Pulse Check Posterior Tibialis and Dorsalis pulse intact bilaterally: Yes Comments Thickened onychomycotic toenails L great toe, 2nd and 5th toes    Cryotherapy  Liquid nitrogen was applied for 10-12 seconds to the skin lesion on abdomen and the expected blistering or scabbing reaction explained. Do not pick at the area. Patient reminded to expect hypopigmented scars from the procedure. Return if lesion fails to fully resolve.     Results for orders placed or performed in visit on 10/13/18  PSA  Result Value Ref Range   PSA 1.21 0.10 - 4.00 ng/mL  Hemoglobin A1c  Result Value Ref Range   Hgb A1c MFr Bld 6.9 (H) 4.6 - 6.5 %  Lipid panel  Result Value Ref Range   Cholesterol 117 0 - 200 mg/dL   Triglycerides 62.0 0.0 - 149.0 mg/dL   HDL 45.70 >39.00 mg/dL   VLDL 12.4 0.0 - 40.0 mg/dL   LDL Cholesterol 59 0 - 99 mg/dL   Total CHOL/HDL Ratio 3    NonHDL 70.91   Comprehensive metabolic panel  Result Value Ref Range   Sodium 139 135 - 145 mEq/L   Potassium 4.2 3.5 - 5.1 mEq/L   Chloride 101 96 - 112 mEq/L   CO2 31 19 - 32 mEq/L   Glucose, Bld 120 (H) 70 - 99 mg/dL   BUN 14 6 - 23 mg/dL   Creatinine, Ser 0.74 0.40 - 1.50 mg/dL   Total Bilirubin 0.6 0.2 - 1.2 mg/dL    Alkaline Phosphatase 56 39 - 117 U/L   AST 20 0 - 37 U/L   ALT 16 0 - 53 U/L   Total Protein 7.2 6.0 - 8.3 g/dL   Albumin 4.5 3.5 - 5.2 g/dL   Calcium 9.2 8.4 - 10.5 mg/dL   GFR 105.49 >60.00 mL/min   Assessment & Plan:   Problem List Items Addressed This Visit    Skin growth    Anticipate verruca vulgaris. Pt requests treatment. IC obtained and in chart. Cryotherapy used. Monitor for infection. Return if not fully resolved.       Severe obesity (BMI 35.0-39.9) with comorbidity ([place])    Weight gain noted. Encouraged healthy diet and lifestyle changes to affect sustainable weight loss.       Prostate nodule    New - will refer to urology for further evaluation. 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 Diminished breath sounds RLL - update CXR today.       Relevant Orders   DG Chest 2 View   Hyperlipidemia associated with type 2 diabetes mellitus ([place])    Great control on current regimen - continue. The ASCVD Risk score Mikey Bussing DC Brooke Bonito., et al., 2013) failed to calculate for  the following reasons:   The patient has a prior MI or stroke diagnosis       Relevant Medications   ezetimibe (ZETIA) 10 MG tablet   rosuvastatin (CRESTOR) 5 MG tablet   HTN (hypertension)    Chronic, stable. Continue current regimen.       Relevant Medications   ezetimibe (ZETIA) 10 MG tablet   rosuvastatin (CRESTOR) 5 MG tablet   Diet-controlled type 2 diabetes mellitus (H[facility])    Chronic, stable. Weight gain noted, reviewed trending A1c. He will renew efforts at diabetic diet and weight loss by restarting regular exercise routine.       Relevant Medications   rosuvastatin (CRESTOR) 5 MG tablet   Depression with anxiety    Stable period on prozac - continue.        Relevant Medications   FLUoxetine (PROZAC) 10 MG capsule   CAD (coronary artery disease)    Followed by cardiology. Appreciate their care.       Relevant Medications   ezetimibe (ZETIA) 10 MG tablet   rosuvastatin (CRESTOR) 5 MG tablet   BPH (benign prostatic hyperplasia)    Continue rapaflo which is effective. DRE reassuring today.       Relevant Medications   silodosin (RAPAFLO) 8 MG CAPS capsule   Other Relevant Orders   Ambulatory referral to Urology   Advanced care planning/counseling discussion - Primary    Advanced directive discussion - has at home, brought copy today which will be scanned. HCPOA would be GF       Other Visit Diagnoses    Need for influenza vaccination       Relevant Orders   Flu Vaccine QUAD High Dose(Fluad) (Completed)   Need for 23-polyvalent pneumococcal polysaccharide vaccine       Relevant Orders   Pneumococcal polysaccharide vaccine 23-valent greater than or equal to 2yo subcutaneous/IM (Completed)       Meds ordered this encounter  Medications  . ezetimibe (ZETIA) 10 MG tablet    Sig: Take 1 tablet (10 mg total) by mouth daily.    Dispense:  90 tablet    Refill:  3  . FLUoxetine (PROZAC) 10 MG capsule    Sig: Take 1 capsule (10 mg total) by mouth daily.    Dispense:  90 capsule    Refill:  3    Requesting 1 year supply  . Potassium 99 MG TABS    Sig: Take 1-2 tablets (99-198 mg total) by mouth daily.  . rosuvastatin (CRESTOR) 5 MG tablet    Sig: Take 1 tablet (5 mg total) by mouth daily.    Dispense:  90 tablet    Refill:  3    Requesting 1 year supply  . silodosin (RAPAFLO) 8 MG CAPS capsule    Sig: Take 1 capsule (8 mg total) by mouth daily with breakfast.    Dispense:  90 capsule    Refill:  3    Requesting 1 year supply  . albuterol (VENTOLIN HFA) 108 (90 Base) MCG/ACT inhaler    Sig: Inhale 2 puffs into the lungs every 6 (six) hours as needed for wheezing or shortness of breath.    Dispense:  18 g    Refill:  3   Orders  Placed This Encounter  Procedures  . DG Chest 2 View    Standing Status:   Future    Number of Occurrences:   1    Standing Expiration Date:   12/19/2019    Order Specific Question:  Reason for Exam (SYMPTOM  OR DIAGNOSIS REQUIRED)    Answer:   diminished breath sounds RLL, asxs ex smoker    Order Specific Question:   Preferred imaging location?    Answer:   [facility]    Order Specific Question:   Radiology Contrast Protocol - do NOT remove file path    Answer:   [email or web address]  . Flu Vaccine QUAD High Dose(Fluad)  . Pneumococcal polysaccharide vaccine 23-valent greater than or equal to 2yo subcutaneous/IM  . Ambulatory referral to Urology    Referral Priority:   Routine    Referral Type:   Consultation    Referral Reason:   Specialty Services Required    Requested Specialty:   Urology    Number of Visits Requested:   1    Follow up plan: Return in about 6 months (around 04/19/2019) for follow up visit.  [name], MD

## 2018-10-19 NOTE — Assessment & Plan Note (Signed)
Chronic, stable. Weight gain noted, reviewed trending A1c. He will renew efforts at diabetic diet and weight loss by restarting regular exercise routine.

## 2018-10-19 NOTE — Assessment & Plan Note (Signed)
H/o working in [employer] but states he's been evaluated and tested negative for black lung. Diminished breath sounds RLL - update CXR today.

## 2018-10-19 NOTE — Assessment & Plan Note (Signed)
Followed by cardiology. Appreciate their care.  

## 2018-10-19 NOTE — Assessment & Plan Note (Signed)
Great control on current regimen - continue. The ASCVD Risk score Mikey Bussing DC Jr., et al., 2013) failed to calculate for the following reasons:   The patient has a prior MI or stroke diagnosis

## 2018-10-19 NOTE — Progress Notes (Signed)
PCP notes: none  Health Maintenance: Patient wants to get flu and pneumovax vaccines today at his physical. Patient will check with his pharmacy about the Shingrix vaccine.     Abnormal Screenings:none     Patient concerns: Patient complains of intermittent left shoulder pain. Onset months ago. Pain scale 4/10.  Patient complains of ongoing toenail fungus.     Nurse concerns: none    Next PCP appt.: Today @ 10:30 am

## 2018-10-19 NOTE — Assessment & Plan Note (Signed)
Continue rapaflo which is effective. DRE reassuring today.

## 2018-10-19 NOTE — Assessment & Plan Note (Signed)
Advanced directive discussion - has at home, brought copy today which will be scanned. HCPOA would be GF

## 2018-10-19 NOTE — Assessment & Plan Note (Signed)
Anticipate verruca vulgaris. Pt requests treatment. IC obtained and in chart. Cryotherapy used. Monitor for infection. Return if not fully resolved.

## 2018-10-25 ENCOUNTER — Encounter: Payer: Self-pay | Admitting: Family Medicine

## 2018-10-25 DIAGNOSIS — J986 Disorders of diaphragm: Secondary | ICD-10-CM | POA: Insufficient documentation

## 2018-10-26 ENCOUNTER — Other Ambulatory Visit: Payer: Self-pay | Admitting: Family Medicine

## 2018-10-26 ENCOUNTER — Other Ambulatory Visit: Payer: Medicare Other

## 2018-11-16 DIAGNOSIS — N402 Nodular prostate without lower urinary tract symptoms: Secondary | ICD-10-CM | POA: Diagnosis not present

## 2018-12-08 ENCOUNTER — Other Ambulatory Visit: Payer: Self-pay

## 2018-12-08 NOTE — Telephone Encounter (Signed)
Travis Howell (DPR signed) said that pt needs refill amlodipine valsartan 5-[facility] mg [facility]. Pt has not missed any med but just found out that Dr from [facility] [place] has been filling amlodipine valsartan and pt has not lived in [place] for 4 yrs. Pt request Dr Darnell Level fill the med.pt last medicare wellness visit done on 10/19/18. Pt has couple of wks of med left.

## 2018-12-09 DIAGNOSIS — N402 Nodular prostate without lower urinary tract symptoms: Secondary | ICD-10-CM | POA: Diagnosis not present

## 2018-12-09 MED ORDER — AMLODIPINE BESYLATE-VALSARTAN 5-160 MG PO TABS: 0.5000 | ORAL_TABLET | Freq: Every day | ORAL | 3 refills | Status: DC

## 2018-12-09 NOTE — Telephone Encounter (Signed)
Sent!

## 2019-01-27 DIAGNOSIS — N3000 Acute cystitis without hematuria: Secondary | ICD-10-CM | POA: Diagnosis not present

## 2019-01-27 DIAGNOSIS — N402 Nodular prostate without lower urinary tract symptoms: Secondary | ICD-10-CM | POA: Diagnosis not present

## 2019-01-27 DIAGNOSIS — N411 Chronic prostatitis: Secondary | ICD-10-CM | POA: Diagnosis not present

## 2019-01-27 DIAGNOSIS — N410 Acute prostatitis: Secondary | ICD-10-CM | POA: Diagnosis not present

## 2019-03-07 ENCOUNTER — Telehealth: Payer: Self-pay

## 2019-03-07 NOTE — Telephone Encounter (Signed)
Travis Howell, caretaker, called about his blood sugar readings. Ate breakfast at 730. Just checked his level and it was 201. She has been checking for the last 2 weeks. FBS 178 highest lowest 138.  Asking if he needs to be seen sooner than April to address this.

## 2019-03-08 MED ORDER — METFORMIN HCL 500 MG PO TABS: 500.0000 mg | ORAL_TABLET | Freq: Every day | ORAL | 3 refills | Status: DC

## 2019-03-08 NOTE — Addendum Note (Signed)
Addended by: Brenton Grills on: [id number] AM   Modules accepted: Orders

## 2019-03-08 NOTE — Telephone Encounter (Signed)
Would offer starting metformin 500mg  daily and then we can reassess control at f/u visit next month.  If interested, plz send in metformin 500mg  once daily with breakfast #30 RF:3 to local pharmacy.

## 2019-03-08 NOTE — Telephone Encounter (Signed)
Spoke with pt's significant other, [name] (on dpr), relaying Dr. Synthia Innocent message.  Verbalizes understanding and states pt has agreed to anything Dr. Darnell Level suggests.   E-scribed metformin as instructed to [facility].

## 2019-04-17 ENCOUNTER — Telehealth: Payer: Self-pay | Admitting: Family Medicine

## 2019-04-17 ENCOUNTER — Encounter: Payer: Self-pay | Admitting: Family Medicine

## 2019-04-17 ENCOUNTER — Other Ambulatory Visit: Payer: Self-pay

## 2019-04-17 ENCOUNTER — Ambulatory Visit (INDEPENDENT_AMBULATORY_CARE_PROVIDER_SITE_OTHER): Payer: Medicare Other | Admitting: Family Medicine

## 2019-04-17 VITALS — BP 118/60 | HR 80 | Temp 97.9°F | Ht 67.0 in | Wt 229.1 lb

## 2019-04-17 DIAGNOSIS — N3001 Acute cystitis with hematuria: Secondary | ICD-10-CM | POA: Diagnosis not present

## 2019-04-17 DIAGNOSIS — E118 Type 2 diabetes mellitus with unspecified complications: Secondary | ICD-10-CM

## 2019-04-17 DIAGNOSIS — R1030 Lower abdominal pain, unspecified: Secondary | ICD-10-CM | POA: Diagnosis not present

## 2019-04-17 DIAGNOSIS — N410 Acute prostatitis: Secondary | ICD-10-CM | POA: Insufficient documentation

## 2019-04-17 DIAGNOSIS — N402 Nodular prostate without lower urinary tract symptoms: Secondary | ICD-10-CM | POA: Diagnosis not present

## 2019-04-17 HISTORY — DX: Acute prostatitis: N41.0

## 2019-04-17 LAB — POC URINALSYSI DIPSTICK (AUTOMATED)
Glucose, UA: NEGATIVE
Nitrite, UA: NEGATIVE
Protein, UA: POSITIVE — AB
Spec Grav, UA: 1.025 (ref 1.010–1.025)
Urobilinogen, UA: 1 E.U./dL
pH, UA: 6 (ref 5.0–8.0)

## 2019-04-17 LAB — POCT GLYCOSYLATED HEMOGLOBIN (HGB A1C): Hemoglobin A1C: 6.3 % — AB (ref 4.0–5.6)

## 2019-04-17 MED ORDER — SULFAMETHOXAZOLE-TRIMETHOPRIM 800-160 MG PO TABS: 1.0000 | ORAL_TABLET | Freq: Two times a day (BID) | ORAL | 0 refills | Status: DC

## 2019-04-17 NOTE — Telephone Encounter (Signed)
UTI symptoms (lower abd pain, itching on genital region) - home test for UTI was positive - symptoms last 2-3 days.  Fever 102.9, no covid symptoms.  BS levels running 130-180s  Wife is asking if there is any way that he can be worked in today with Dr Darnell Level He has an appt scheduled on Thur 04/20/19  Can he be worked in today or do you want him to do a urine dip today and keep appt for Thursday as scheduled?  Please advise, thanks.

## 2019-04-17 NOTE — Progress Notes (Signed)
This visit was conducted in person.  BP 118/60 (BP Location: Left Arm, Patient Position: Sitting, Cuff Size: Large)   Pulse 80   Temp 97.9 F (36.6 C) (Temporal)   Ht 5\' 7"  (1.702 m)   Wt 229 lb 1 oz (103.9 kg)   SpO2 96%   BMI 35.88 kg/m    CC: abd pain Subjective:    Patient ID: Travis Howell, male    DOB: [birth year], 69 y.o.   MRN: [id number]  HPI: Travis Howell is a 69 y.o. male presenting on 04/17/2019 for Abdominal Pain (C/o low abd pain and itching when urinating.  Also, c/o fever yesterday- max 102. Sxs started yesterday.  Tried Tylenol. )   1d h/o mid lower abd discomfort, nausea, chills, Tmax 102 treated with tylenol. Took tylenol this morning at 6am, 12pm. No abd pain today. Some itching to penis after voiding.   No dysuria, urgency, frequency, hematuria. No flank pain.  No vomiting, diarrhea, constipation.  No new foods.   Has had recent UTIs treated by urology - states 2 separate cases first one treated with 1 wk course abx second one treated with 2 wk course abx  Sugars have been staying elevated - up to 189 yesterday, this morning 139. Started metformin 03/2019.  Lab Results  Component Value Date   HGBA1C 6.3 (A) 04/17/2019     Recent prostate biopsy for prostate nodule returned benign calcification ([place]).      Relevant past medical, surgical, family and social history reviewed and updated as indicated. Interim medical history since our last visit reviewed. Allergies and medications reviewed and updated. Outpatient Medications Prior to Visit  Medication Sig Dispense Refill  . albuterol (VENTOLIN HFA) 108 (90 Base) MCG/ACT inhaler Inhale 2 puffs into the lungs every 6 (six) hours as needed for wheezing or shortness of breath. 18 g 3  . amLODipine-valsartan (EXFORGE) 5-160 MG tablet Take 0.5 tablets by mouth daily. 45 tablet 3  . aspirin 81 MG chewable tablet Chew 81 mg by mouth daily.     Marland Kitchen ezetimibe (ZETIA) 10 MG tablet Take 1 tablet (10 mg  total) by mouth daily. 90 tablet 3  . FLUoxetine (PROZAC) 10 MG capsule Take 1 capsule (10 mg total) by mouth daily. 90 capsule 3  . metFORMIN (GLUCOPHAGE) 500 MG tablet Take 1 tablet (500 mg total) by mouth daily with breakfast. 30 tablet 3  . Omega-3 Fatty Acids (FISH OIL) 1000 MG CAPS Take 4,000 mg by mouth daily.    . Potassium 99 MG TABS Take 1-2 tablets (99-198 mg total) by mouth daily.    . rosuvastatin (CRESTOR) 5 MG tablet Take 1 tablet (5 mg total) by mouth daily. 90 tablet 3  . silodosin (RAPAFLO) 8 MG CAPS capsule Take 1 capsule (8 mg total) by mouth daily with breakfast. 90 capsule 3   No facility-administered medications prior to visit.     Per HPI unless specifically indicated in ROS section below Review of Systems Objective:    BP 118/60 (BP Location: Left Arm, Patient Position: Sitting, Cuff Size: Large)   Pulse 80   Temp 97.9 F (36.6 C) (Temporal)   Ht 5\' 7"  (1.702 m)   Wt 229 lb 1 oz (103.9 kg)   SpO2 96%   BMI 35.88 kg/m   Wt Readings from Last 3 Encounters:  04/17/19 229 lb 1 oz (103.9 kg)  10/19/18 234 lb 2 oz (106.2 kg)  04/11/18 233 lb (105.7 kg)    Physical Exam  Vitals and nursing note reviewed.  Constitutional:      Appearance: Normal appearance. He is obese. He is not ill-appearing.  Abdominal:     General: Abdomen is flat. Bowel sounds are normal. There is no distension.     Palpations: Abdomen is soft. There is no mass.     Tenderness: There is no abdominal tenderness. There is no right CVA tenderness, left CVA tenderness, guarding or rebound.     Hernia: No hernia is present. There is no hernia in the left inguinal area or right inguinal area.  Genitourinary:    Pubic Area: No rash.      Penis: Normal and circumcised.      Testes: Normal.        Right: Mass, tenderness, swelling or testicular hydrocele not present.        Left: Mass, tenderness, swelling or testicular hydrocele not present.  Lymphadenopathy:     Lower Body: No right inguinal  adenopathy. No left inguinal adenopathy.  Neurological:     Mental Status: He is alert.  Psychiatric:        Mood and Affect: Mood normal.        Behavior: Behavior normal.       Results for orders placed or performed in visit on 04/17/19  POCT Urinalysis Dipstick (Automated)  Result Value Ref Range   Color, UA yellow    Clarity, UA cloudy    Glucose, UA Negative Negative   Bilirubin, UA 1+    Ketones, UA +/-    Spec Grav, UA 1.025 1.010 - 1.025   Blood, UA 3+    pH, UA 6.0 5.0 - 8.0   Protein, UA Positive (A) Negative   Urobilinogen, UA 1.0 0.2 or 1.0 E.U./dL   Nitrite, UA negative    Leukocytes, UA Large (3+) (A) Negative  POCT glycosylated hemoglobin (Hb A1C)  Result Value Ref Range   Hemoglobin A1C 6.3 (A) 4.0 - 5.6 %   HbA1c POC (<> result, manual entry)     HbA1c, POC (prediabetic range)     HbA1c, POC (controlled diabetic range)     Assessment & Plan:  This visit occurred during the SARS-CoV-2 public health emergency.  Safety protocols were in place, including screening questions prior to the visit, additional usage of staff PPE, and extensive cleaning of exam room while observing appropriate contact time as indicated for disinfecting solutions.   Problem List Items Addressed This Visit    Prostate nodule    Patient states biopsy returned normal.       Controlled diabetes mellitus type 2 with complications ([place])    Update A1c on metformin.       Relevant Orders   POCT glycosylated hemoglobin (Hb A1C) (Completed)   Acute cystitis with hematuria - Primary    Anticipate UTI - doesn't have typical symptoms but UA/micro suggestive of this.  Treat with bactrim DS BID x 7 days.  UCx sent.  Update if not improving with treatment.  Will plan for rpt UA at next visit given microhematuria noted today. Suggested he return in 1-2 mo for f/u UTI (as he gives recent history of recurrent UTIs)       Other Visit Diagnoses    Lower abdominal pain       Relevant Orders    POCT Urinalysis Dipstick (Automated) (Completed)   Urine Culture       Meds ordered this encounter  Medications  . sulfamethoxazole-trimethoprim (BACTRIM DS) 800-160 MG tablet    Sig:  Take 1 tablet by mouth 2 (two) times daily.    Dispense:  14 tablet    Refill:  0   Orders Placed This Encounter  Procedures  . Urine Culture  . POCT Urinalysis Dipstick (Automated)  . POCT glycosylated hemoglobin (Hb A1C)    Patient instructions:  I do think you have a UTI - treat with bactrim twice daily for 7 days.  Urine culture sent to pharmacy.  Let us know if not improving with treatment. A1c was better than last checked 10/2018. Continue metformin.  Follow up plan: Return if symptoms worsen or fail to improve.  [name], MD

## 2019-04-17 NOTE — Patient Instructions (Signed)
I do think you have a UTI - treat with bactrim twice daily for 7 days.  Urine culture sent to pharmacy.  Let us know if not improving with treatment. A1c was better than last checked 10/2018. Continue metformin.  Urinary Tract Infection, Adult  A urinary tract infection (UTI) is an infection of any part of the urinary tract. The urinary tract includes the kidneys, ureters, bladder, and urethra. These organs make, store, and get rid of urine in the body. Your health care provider may use other names to describe the infection. An upper UTI affects the ureters and kidneys (pyelonephritis). A lower UTI affects the bladder (cystitis) and urethra (urethritis). What are the causes? Most urinary tract infections are caused by bacteria in your genital area, around the entrance to your urinary tract (urethra). These bacteria grow and cause inflammation of your urinary tract. What increases the risk? You are more likely to develop this condition if:  You have a urinary catheter that stays in place (indwelling).  You are not able to control when you urinate or have a bowel movement (you have incontinence).  You are male and you: ? Use a spermicide or diaphragm for birth control. ? Have low estrogen levels. ? Are pregnant.  You have certain genes that increase your risk (genetics).  You are sexually active.  You take antibiotic medicines.  You have a condition that causes your flow of urine to slow down, such as: ? An enlarged prostate, if you are male. ? Blockage in your urethra (stricture). ? A kidney stone. ? A nerve condition that affects your bladder control (neurogenic bladder). ? Not getting enough to drink, or not urinating often.  You have certain medical conditions, such as: ? Diabetes. ? A weak disease-fighting system (immunesystem). ? Sickle cell disease. ? Gout. ? Spinal cord injury. What are the signs or symptoms? Symptoms of this condition include:  Needing to urinate  right away (urgently).  Frequent urination or passing small amounts of urine frequently.  Pain or burning with urination.  Blood in the urine.  Urine that smells bad or unusual.  Trouble urinating.  Cloudy urine.  Vaginal discharge, if you are male.  Pain in the abdomen or the lower back. You may also have:  Vomiting or a decreased appetite.  Confusion.  Irritability or tiredness.  A fever.  Diarrhea. The first symptom in older adults may be confusion. In some cases, they may not have any symptoms until the infection has worsened. How is this diagnosed? This condition is diagnosed based on your medical history and a physical exam. You may also have other tests, including:  Urine tests.  Blood tests.  Tests for sexually transmitted infections (STIs). If you have had more than one UTI, a cystoscopy or imaging studies may be done to determine the cause of the infections. How is this treated? Treatment for this condition includes:  Antibiotic medicine.  Over-the-counter medicines to treat discomfort.  Drinking enough water to stay hydrated. If you have frequent infections or have other conditions such as a kidney stone, you may need to see a health care provider who specializes in the urinary tract (urologist). In rare cases, urinary tract infections can cause sepsis. Sepsis is a life-threatening condition that occurs when the body responds to an infection. Sepsis is treated in the hospital with IV antibiotics, fluids, and other medicines. Follow these instructions at home:  Medicines  Take over-the-counter and prescription medicines only as told by your health care provider.  If you were prescribed an antibiotic medicine, take it as told by your health care provider. Do not stop using the antibiotic even if you start to feel better. General instructions  Make sure you: ? Empty your bladder often and completely. Do not hold urine for long periods of  time. ? Empty your bladder after sex. ? Wipe from front to back after a bowel movement if you are male. Use each tissue one time when you wipe.  Drink enough fluid to keep your urine pale yellow.  Keep all follow-up visits as told by your health care provider. This is important. Contact a health care provider if:  Your symptoms do not get better after 1-2 days.  Your symptoms go away and then return. Get help right away if you have:  Severe pain in your back or your lower abdomen.  A fever.  Nausea or vomiting. Summary  A urinary tract infection (UTI) is an infection of any part of the urinary tract, which includes the kidneys, ureters, bladder, and urethra.  Most urinary tract infections are caused by bacteria in your genital area, around the entrance to your urinary tract (urethra).  Treatment for this condition often includes antibiotic medicines.  If you were prescribed an antibiotic medicine, take it as told by your health care provider. Do not stop using the antibiotic even if you start to feel better.  Keep all follow-up visits as told by your health care provider. This is important. This information is not intended to replace advice given to you by your health care provider. Make sure you discuss any questions you have with your health care provider. Document Revised: 12/09/2017 Document Reviewed: 07/01/2017 Elsevier Patient Education  2020 [employer].

## 2019-04-17 NOTE — Telephone Encounter (Signed)
Plz see note/thx dmf

## 2019-04-17 NOTE — Telephone Encounter (Signed)
Please work in at 4:30pm today.

## 2019-04-17 NOTE — Assessment & Plan Note (Signed)
Patient states biopsy returned normal.

## 2019-04-17 NOTE — Assessment & Plan Note (Addendum)
Anticipate UTI - doesn't have typical symptoms but UA/micro suggestive of this.  Treat with bactrim DS BID x 7 days.  UCx sent.  Update if not improving with treatment.  Will plan for rpt UA at next visit given microhematuria noted today. Suggested he return in 1-2 mo for f/u UTI (as he gives recent history of recurrent UTIs)

## 2019-04-17 NOTE — Assessment & Plan Note (Signed)
Update A1c on metformin.

## 2019-04-17 NOTE — Telephone Encounter (Signed)
Pt was added to schedule and aware of OV.

## 2019-04-19 LAB — URINE CULTURE
MICRO NUMBER:: 10352085
SPECIMEN QUALITY:: ADEQUATE

## 2019-04-20 ENCOUNTER — Ambulatory Visit: Payer: Medicare Other | Admitting: Family Medicine

## 2019-05-08 ENCOUNTER — Telehealth: Payer: Self-pay

## 2019-05-08 DIAGNOSIS — M1711 Unilateral primary osteoarthritis, right knee: Secondary | ICD-10-CM | POA: Diagnosis not present

## 2019-05-08 DIAGNOSIS — M19012 Primary osteoarthritis, left shoulder: Secondary | ICD-10-CM | POA: Diagnosis not present

## 2019-05-08 DIAGNOSIS — M1712 Unilateral primary osteoarthritis, left knee: Secondary | ICD-10-CM | POA: Diagnosis not present

## 2019-05-08 NOTE — Telephone Encounter (Signed)
   [facility] Pre-operative Risk Assessment    Request for surgical clearance:  1. What type of surgery is being performed? Left Total Shoulder Arthroplasty  2. When is this surgery scheduled? TBD   3. What type of clearance is required (medical clearance vs. Pharmacy clearance to hold med vs. Both)? Both  4. Are there any medications that need to be held prior to surgery and how long? Aspirin  5. Practice name and name of physician performing surgery? [facility] not listed   6. What is your office phone number [phone]    7.   What is your office fax number [phone]  8.   Anesthesia type (None, local, MAC, general) ?  [facility]   [place] 05/08/2019, 3:36 PM  _________________________________________________________________

## 2019-05-09 ENCOUNTER — Telehealth: Payer: Self-pay | Admitting: Family Medicine

## 2019-05-09 ENCOUNTER — Other Ambulatory Visit: Payer: Self-pay | Admitting: Orthopedic Surgery

## 2019-05-09 DIAGNOSIS — M19012 Primary osteoarthritis, left shoulder: Secondary | ICD-10-CM

## 2019-05-09 NOTE — Telephone Encounter (Signed)
[name], Travis Howell  Chart reviewed as part of pre-operative protocol coverage. Because of Travis Howell's past medical history and time since last visit, he/she will require a follow-up visit in order to better assess preoperative cardiovascular risk. He has not been seen in a year.  Pre-op covering staff: - Please schedule appointment and call patient to inform them. - Please contact requesting surgeon's office via preferred method (i.e, phone, fax) to inform them of need for appointment prior to surgery.  If applicable, this message will also be routed to pharmacy pool and/or primary cardiologist for input on holding anticoagulant/antiplatelet agent as requested below so that this information is available at time of patient's appointment.   Travis Sims, Travis Howell  05/09/2019, 9:36 AM

## 2019-05-09 NOTE — Telephone Encounter (Signed)
Pt wife is calling to see if we have received any clearance forms for surgery. Pt is needing Shoulder Arthroplasty. Would be coming from [place].   Have you received any forms for clearance?  She is also calling their office back to have them re-fax

## 2019-05-10 NOTE — Telephone Encounter (Signed)
I did receive form. plz call to schedule preop eval. I believe he is also supposed to see cards for cardiac clerance prior to surgery.

## 2019-05-10 NOTE — Telephone Encounter (Signed)
Spoke with pt's significant other, [name] (on dpr), notifying her we did get form.  Scheduled pre-op OV on 05/12/19 at 9:30.

## 2019-05-11 NOTE — Telephone Encounter (Signed)
Spoke with pt's wife (DPR on file) who is agreeable to pt seeing DR. McLean on 05/26/19 at 8:40 am. Pt's wife verbalized understanding and thanked me for the call.

## 2019-05-11 NOTE — Telephone Encounter (Signed)
I have faxed over recommendations to requesting surgeon's office. 

## 2019-05-12 ENCOUNTER — Ambulatory Visit (INDEPENDENT_AMBULATORY_CARE_PROVIDER_SITE_OTHER)
Admission: RE | Admit: 2019-05-12 | Discharge: 2019-05-12 | Disposition: A | Discharge status: Home / Self Care | Payer: Medicare Other | Source: Ambulatory Visit | Attending: Family Medicine | Admitting: Family Medicine

## 2019-05-12 ENCOUNTER — Encounter: Payer: Self-pay | Admitting: Family Medicine

## 2019-05-12 ENCOUNTER — Ambulatory Visit (INDEPENDENT_AMBULATORY_CARE_PROVIDER_SITE_OTHER): Payer: Medicare Other | Admitting: Family Medicine

## 2019-05-12 ENCOUNTER — Other Ambulatory Visit: Payer: Self-pay

## 2019-05-12 VITALS — BP 116/64 | HR 96 | Temp 97.5°F | Ht 67.0 in | Wt 225.2 lb

## 2019-05-12 DIAGNOSIS — M19012 Primary osteoarthritis, left shoulder: Secondary | ICD-10-CM | POA: Diagnosis not present

## 2019-05-12 DIAGNOSIS — I2510 Atherosclerotic heart disease of native coronary artery without angina pectoris: Secondary | ICD-10-CM | POA: Diagnosis not present

## 2019-05-12 DIAGNOSIS — N3001 Acute cystitis with hematuria: Secondary | ICD-10-CM | POA: Diagnosis not present

## 2019-05-12 DIAGNOSIS — I10 Essential (primary) hypertension: Secondary | ICD-10-CM | POA: Diagnosis not present

## 2019-05-12 DIAGNOSIS — Z01818 Encounter for other preprocedural examination: Secondary | ICD-10-CM

## 2019-05-12 DIAGNOSIS — E785 Hyperlipidemia, unspecified: Secondary | ICD-10-CM | POA: Diagnosis not present

## 2019-05-12 DIAGNOSIS — R0689 Other abnormalities of breathing: Secondary | ICD-10-CM | POA: Diagnosis not present

## 2019-05-12 DIAGNOSIS — E118 Type 2 diabetes mellitus with unspecified complications: Secondary | ICD-10-CM

## 2019-05-12 DIAGNOSIS — J986 Disorders of diaphragm: Secondary | ICD-10-CM | POA: Diagnosis not present

## 2019-05-12 DIAGNOSIS — E1169 Type 2 diabetes mellitus with other specified complication: Secondary | ICD-10-CM

## 2019-05-12 LAB — POC URINALSYSI DIPSTICK (AUTOMATED)
Bilirubin, UA: NEGATIVE
Glucose, UA: NEGATIVE
Nitrite, UA: POSITIVE
Protein, UA: POSITIVE — AB
Spec Grav, UA: 1.025 (ref 1.010–1.025)
Urobilinogen, UA: 1 E.U./dL
pH, UA: 6 (ref 5.0–8.0)

## 2019-05-12 LAB — BASIC METABOLIC PANEL
BUN: 19 mg/dL (ref 6–23)
CO2: 31 mEq/L (ref 19–32)
Calcium: 9.4 mg/dL (ref 8.4–10.5)
Chloride: 99 mEq/L (ref 96–112)
Creatinine, Ser: 1.01 mg/dL (ref 0.40–1.50)
GFR: 73.54 mL/min (ref >60.00)
Glucose, Bld: 166 mg/dL — ABNORMAL HIGH (ref 70–99)
Potassium: 3.9 mEq/L (ref 3.5–5.1)
Sodium: 136 mEq/L (ref 135–145)

## 2019-05-12 LAB — CBC WITH DIFFERENTIAL/PLATELET
Basophils Absolute: 0 10*3/uL (ref 0.0–0.1)
Basophils Relative: 0.1 % (ref 0.0–3.0)
Eosinophils Absolute: 0.1 10*3/uL (ref 0.0–0.7)
Eosinophils Relative: 0.5 % (ref 0.0–5.0)
HCT: 37.7 % — ABNORMAL LOW (ref 39.0–52.0)
Hemoglobin: 12.9 g/dL — ABNORMAL LOW (ref 13.0–17.0)
Lymphocytes Relative: 7 % — ABNORMAL LOW (ref 12.0–46.0)
Lymphs Abs: 0.8 10*3/uL (ref 0.7–4.0)
MCHC: 34.2 g/dL (ref 30.0–36.0)
MCV: 99.2 fl (ref 78.0–100.0)
Monocytes Absolute: 0.9 10*3/uL (ref 0.1–1.0)
Monocytes Relative: 7.6 % (ref 3.0–12.0)
Neutro Abs: 9.9 10*3/uL — ABNORMAL HIGH (ref 1.4–7.7)
Neutrophils Relative %: 84.8 % — ABNORMAL HIGH (ref 43.0–77.0)
Platelets: 155 10*3/uL (ref 150.0–400.0)
RBC: 3.8 Mil/uL — ABNORMAL LOW (ref 4.22–5.81)
RDW: 13.5 % (ref 11.5–15.5)
WBC: 11.6 10*3/uL — ABNORMAL HIGH (ref 4.0–10.5)

## 2019-05-12 LAB — BRAIN NATRIURETIC PEPTIDE: Pro B Natriuretic peptide (BNP): 92 pg/mL (ref 0.0–100.0)

## 2019-05-12 MED ORDER — SULFAMETHOXAZOLE-TRIMETHOPRIM 800-160 MG PO TABS: 1.0000 | ORAL_TABLET | Freq: Two times a day (BID) | ORAL | 0 refills | Status: DC

## 2019-05-12 NOTE — Assessment & Plan Note (Signed)
Update CXR.  

## 2019-05-12 NOTE — Assessment & Plan Note (Signed)
Great control on low dose metformin - continue.

## 2019-05-12 NOTE — Assessment & Plan Note (Signed)
Upcoming L total shoulder replacement

## 2019-05-12 NOTE — Assessment & Plan Note (Signed)
RCRI = 1 (6% 30d risk of cardiac arrest, MI, death). Measure BNP and other labs, CXR.  Await cards eval and aspirin recommendations but anticipate clear to proceed with low risk surgery.

## 2019-05-12 NOTE — Patient Instructions (Addendum)
I think you have persistent prostate infection - treat with bactrim twice daily for 2 weeks, then return for urine test to ensure resolved.  EKG today Chest xray today  Labs today We will send results to Dr [name].  I anticipate you should do well with surgery.

## 2019-05-12 NOTE — Assessment & Plan Note (Addendum)
Endorses new symptoms of cloudy urine and chills. UA/culture today. Will treat with 2 wk bactrim course (previous UA/culture with Ecoli sensitive to bactrim). I did ask him to return in 2 weeks for rpt UA to ensure infection fully resolved.

## 2019-05-12 NOTE — Assessment & Plan Note (Addendum)
H/o DES to [facility] 2012 now only on aspirin 81mg  dialy.  Update EKG.  Will defer ASA dosing to cardiology.

## 2019-05-12 NOTE — Progress Notes (Addendum)
This visit was conducted in person.  BP 116/64 (BP Location: Left Arm, Patient Position: Sitting, Cuff Size: Large)   Pulse 96   Temp (!) 97.5 F (36.4 C) (Temporal)   Ht 5\' 7"  (1.702 m)   Wt 225 lb 4 oz (102.2 kg)   SpO2 95%   BMI 35.28 kg/m   BP Readings from Last 3 Encounters:  05/12/19 116/64  04/17/19 118/60  10/19/18 122/67    CC: preop eval Subjective:    Patient ID: Travis Howell, male    DOB: 1950/01/17, 69 y.o.   MRN: [id number]  HPI: Travis Howell is a 69 y.o. male presenting on 05/12/2019 for Pre-op Exam (Will have left sholder surgery. )   Upcoming L total shoulder replacement by Dr [name] date TBD.   Recent acute cystitis treated with 1 wk bactrim course with improvement however yesterday started having chills again along with cloudy urine. No lower back or rectal pain. No dysuria, urgency or frequency or hematuria. No flank pain nausea or vomiting. Some itching of penis.   Known CAD s/p DES to LAD 2012, managed on crestor, zetia, and daily aspirin. Last saw cardiology 04/2018, upcoming appointment 05/26/2019.   Well controlled diabetic on metformin 500mg  once daily.  Lab Results  Component Value Date   HGBA1C 6.3 (A) 04/17/2019    Chronic R hemidiaphragm elevation. He did have 50ft fall in 1982.   He has had R total shoulder replacement 2017, tolerated GETA without complications.  Latest surgery rhinoplasty 10/2016 by Dr [name] ENT.  No h/o post op nausea/vomiting or trouble waking up.   No current chest pain, dyspnea, leg swelling, dizziness, headache, palpitations.      Relevant past medical, surgical, family and social history reviewed and updated as indicated. Interim medical history since our last visit reviewed. Allergies and medications reviewed and updated. Outpatient Medications Prior to Visit  Medication Sig Dispense Refill  . albuterol (VENTOLIN HFA) 108 (90 Base) MCG/ACT inhaler Inhale 2 puffs into the lungs every 6 (six) hours as  needed for wheezing or shortness of breath. 18 g 3  . amLODipine-valsartan (EXFORGE) 5-160 MG tablet Take 0.5 tablets by mouth daily. 45 tablet 3  . aspirin 81 MG chewable tablet Chew 81 mg by mouth daily.     Marland Kitchen ezetimibe (ZETIA) 10 MG tablet Take 1 tablet (10 mg total) by mouth daily. 90 tablet 3  . FLUoxetine (PROZAC) 10 MG capsule Take 1 capsule (10 mg total) by mouth daily. 90 capsule 3  . metFORMIN (GLUCOPHAGE) 500 MG tablet Take 1 tablet (500 mg total) by mouth daily with breakfast. 30 tablet 3  . Omega-3 Fatty Acids (FISH OIL) 1000 MG CAPS Take 4,000 mg by mouth daily.    . Potassium 99 MG TABS Take 1-2 tablets (99-198 mg total) by mouth daily.    . rosuvastatin (CRESTOR) 5 MG tablet Take 1 tablet (5 mg total) by mouth daily. 90 tablet 3  . silodosin (RAPAFLO) 8 MG CAPS capsule Take 1 capsule (8 mg total) by mouth daily with breakfast. 90 capsule 3  . sulfamethoxazole-trimethoprim (BACTRIM DS) 800-160 MG tablet Take 1 tablet by mouth 2 (two) times daily. 14 tablet 0   No facility-administered medications prior to visit.     Per HPI unless specifically indicated in ROS section below Review of Systems Objective:  BP 116/64 (BP Location: Left Arm, Patient Position: Sitting, Cuff Size: Large)   Pulse 96   Temp (!) 97.5 F (36.4 C) (Temporal)  Ht 5\' 7"  (1.702 m)   Wt 225 lb 4 oz (102.2 kg)   SpO2 95%   BMI 35.28 kg/m   Wt Readings from Last 3 Encounters:  05/12/19 225 lb 4 oz (102.2 kg)  04/17/19 229 lb 1 oz (103.9 kg)  10/19/18 234 lb 2 oz (106.2 kg)      Physical Exam Vitals and nursing note reviewed.  Constitutional:      Appearance: Normal appearance. He is obese. He is not ill-appearing.  Cardiovascular:     Rate and Rhythm: Normal rate and regular rhythm.     Pulses: Normal pulses.     Heart sounds: Normal heart sounds. No murmur.  Pulmonary:     Effort: Pulmonary effort is normal. No respiratory distress.     Breath sounds: Normal breath sounds. No wheezing,  rhonchi or rales.  Abdominal:     General: Abdomen is flat. Bowel sounds are normal. There is no distension.     Palpations: Abdomen is soft. There is no mass.     Tenderness: There is no abdominal tenderness. There is no right CVA tenderness, left CVA tenderness, guarding or rebound.     Hernia: No hernia is present.  Genitourinary:    Prostate: Normal. Not enlarged, not tender and no nodules present.     Rectum: Normal. No mass, tenderness, anal fissure, external hemorrhoid or internal hemorrhoid. Normal anal tone.     Comments: Mildly indurated prostate Neurological:     Mental Status: He is alert.       Results for orders placed or performed in visit on 05/12/19  POCT Urinalysis Dipstick (Automated)  Result Value Ref Range   Color, UA gold    Clarity, UA cloudy    Glucose, UA Negative Negative   Bilirubin, UA negative    Ketones, UA +/-    Spec Grav, UA 1.025 1.010 - 1.025   Blood, UA 3+    pH, UA 6.0 5.0 - 8.0   Protein, UA Positive (A) Negative   Urobilinogen, UA 1.0 0.2 or 1.0 E.U./dL   Nitrite, UA positive    Leukocytes, UA Large (3+) (A) Negative    EKG - sinus tachycardia 100, LAD with LAFB, normal intervals, no acute ST/T changes, low amplitude septally.  Assessment & Plan:  This visit occurred during the SARS-CoV-2 public health emergency.  Safety protocols were in place, including screening questions prior to the visit, additional usage of staff PPE, and extensive cleaning of exam room while observing appropriate contact time as indicated for disinfecting solutions.   Problem List Items Addressed This Visit    Severe obesity (BMI 35.0-39.9) with comorbidity ([place])   Pre-op evaluation - Primary    RCRI = 1 (6% 30d risk of cardiac arrest, MI, death). Measure BNP and other labs, CXR.  Await cards eval and aspirin recommendations but anticipate clear to proceed with low risk surgery.       Relevant Orders   EKG 12-Lead (Completed)   CBC with Differential/Platelet     Basic metabolic panel   DG Chest 2 View   Hyperlipidemia associated with type 2 diabetes mellitus ([place])   HTN (hypertension)   Elevated hemidiaphragm    Update CXR      Controlled diabetes mellitus type 2 with complications ([place])    Great control on low dose metformin - continue.       CAD (coronary artery disease)    H/o DES to LAD 2012 now only on aspirin 81mg  dialy.  Update EKG.  Will defer ASA dosing to cardiology.       Arthritis of left shoulder region    Upcoming L total shoulder replacement      Acute cystitis with hematuria    Endorses new symptoms of cloudy urine and chills. UA/culture today. Will treat with 2 wk bactrim course (previous UA/culture with Ecoli sensitive to bactrim). I did ask him to return in 2 weeks for rpt UA to ensure infection fully resolved.      Relevant Orders   POCT Urinalysis Dipstick (Automated) (Completed)   Urine Culture    Other Visit Diagnoses    Other abnormalities of breathing        Relevant Orders   Brain natriuretic peptide       Meds ordered this encounter  Medications  . sulfamethoxazole-trimethoprim (BACTRIM DS) 800-160 MG tablet    Sig: Take 1 tablet by mouth 2 (two) times daily.    Dispense:  28 tablet    Refill:  0   Orders Placed This Encounter  Procedures  . Urine Culture  . DG Chest 2 View    Standing Status:   Future    Number of Occurrences:   1    Standing Expiration Date:   07/11/2020    Order Specific Question:   Reason for Exam (SYMPTOM  OR DIAGNOSIS REQUIRED)    Answer:   preop eval    Order Specific Question:   Preferred imaging location?    Answer:   [facility]    Order Specific Question:   Radiology Contrast Protocol - do NOT remove file path    Answer:   [email or web address]  . CBC with Differential/Platelet  . Basic metabolic panel  . Brain natriuretic peptide  . POCT Urinalysis Dipstick (Automated)  . EKG 12-Lead    Patient Instructions   I think you have persistent prostate infection - treat with bactrim twice daily for 2 weeks, then return for urine test to ensure resolved.  EKG today Chest xray today  Labs today We will send results to Dr [name].  I anticipate you should do well with surgery.    Follow up plan: No follow-ups on file.  [name], MD

## 2019-05-12 NOTE — Addendum Note (Signed)
Addended by: [name] on: 05/12/2019 12:56 PM   Modules accepted: Orders

## 2019-05-14 LAB — URINE CULTURE
MICRO NUMBER:: 10452707
SPECIMEN QUALITY:: ADEQUATE

## 2019-05-24 ENCOUNTER — Ambulatory Visit
Admission: RE | Admit: 2019-05-24 | Discharge: 2019-05-24 | Disposition: A | Discharge status: Home / Self Care | Payer: Medicare Other | Source: Ambulatory Visit | Attending: Orthopedic Surgery | Admitting: Orthopedic Surgery

## 2019-05-24 ENCOUNTER — Other Ambulatory Visit: Payer: Self-pay

## 2019-05-24 DIAGNOSIS — Z01818 Encounter for other preprocedural examination: Secondary | ICD-10-CM | POA: Diagnosis not present

## 2019-05-24 DIAGNOSIS — M19012 Primary osteoarthritis, left shoulder: Secondary | ICD-10-CM

## 2019-05-25 ENCOUNTER — Other Ambulatory Visit: Payer: Self-pay | Admitting: Family Medicine

## 2019-05-25 DIAGNOSIS — N3001 Acute cystitis with hematuria: Secondary | ICD-10-CM
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
## 2019-05-26 ENCOUNTER — Ambulatory Visit (INDEPENDENT_AMBULATORY_CARE_PROVIDER_SITE_OTHER): Payer: Medicare Other | Admitting: Interventional Cardiology

## 2019-05-26 ENCOUNTER — Encounter: Payer: Self-pay | Admitting: Interventional Cardiology

## 2019-05-26 ENCOUNTER — Other Ambulatory Visit: Payer: Self-pay

## 2019-05-26 ENCOUNTER — Other Ambulatory Visit: Payer: Medicare Other

## 2019-05-26 VITALS — BP 110/62 | HR 85 | Ht 67.0 in | Wt 223.2 lb

## 2019-05-26 DIAGNOSIS — N3001 Acute cystitis with hematuria: Secondary | ICD-10-CM

## 2019-05-26 DIAGNOSIS — I2510 Atherosclerotic heart disease of native coronary artery without angina pectoris: Secondary | ICD-10-CM | POA: Diagnosis not present

## 2019-05-26 DIAGNOSIS — Z01810 Encounter for preprocedural cardiovascular examination: Secondary | ICD-10-CM

## 2019-05-26 DIAGNOSIS — I119 Hypertensive heart disease without heart failure: Secondary | ICD-10-CM | POA: Diagnosis not present

## 2019-05-26 DIAGNOSIS — E782 Mixed hyperlipidemia: Secondary | ICD-10-CM | POA: Diagnosis not present

## 2019-05-26 DIAGNOSIS — I252 Old myocardial infarction: Secondary | ICD-10-CM

## 2019-05-26 NOTE — Patient Instructions (Signed)
Medication Instructions:  Your physician recommends that you continue on your current medications as directed. Please refer to the Current Medication list given to you today.  *If you need a refill on your cardiac medications before your next appointment, please call your pharmacy*   Lab Work: None ordered  If you have labs (blood work) drawn today and your tests are completely normal, you will receive your results only by: . MyChart Message (if you have MyChart) OR . A paper copy in the mail If you have any lab test that is abnormal or we need to change your treatment, we will call you to review the results.   Testing/Procedures: None ordered   Follow-Up: At [facility], you and your health needs are our priority.  As part of our continuing mission to provide you with exceptional heart care, we have created designated Provider Care Teams.  These Care Teams include your primary Cardiologist (physician) and Advanced Practice Providers (APPs -  Physician Assistants and Nurse Practitioners) who all work together to provide you with the care you need, when you need it.  We recommend signing up for the patient portal called "MyChart".  Sign up information is provided on this After Visit Summary.  MyChart is used to connect with patients for Virtual Visits (Telemedicine).  Patients are able to view lab/test results, encounter notes, upcoming appointments, etc.  Non-urgent messages can be sent to your provider as well.   To learn more about what you can do with MyChart, go to [email or web address].    Your next appointment:   12 month(s)  The format for your next appointment:   In Person  Provider:   You may see Jayadeep Varanasi, MD or one of the following Advanced Practice Providers on your designated Care Team:    Dayna Dunn, PA-C  Michele Lenze, PA-C    Other Instructions  High-Fiber Diet Fiber, also called dietary fiber, is a type of carbohydrate that is found in fruits,  vegetables, whole grains, and beans. A high-fiber diet can have many health benefits. Your health care provider may recommend a high-fiber diet to help:  Prevent constipation. Fiber can make your bowel movements more regular.  Lower your cholesterol.  Relieve the following conditions: ? Swelling of veins in the anus (hemorrhoids). ? Swelling and irritation (inflammation) of specific areas of the digestive tract (uncomplicated diverticulosis). ? A problem of the large intestine (colon) that sometimes causes pain and diarrhea (irritable bowel syndrome, IBS).  Prevent overeating as part of a weight-loss plan.  Prevent heart disease, type 2 diabetes, and certain cancers. What is my plan? The recommended daily fiber intake in grams (g) includes:  38 g for men age 50 or younger.  30 g for men over age 50.  25 g for women age 50 or younger.  21 g for women over age 50. You can get the recommended daily intake of dietary fiber by:  Eating a variety of fruits, vegetables, grains, and beans.  Taking a fiber supplement, if it is not possible to get enough fiber through your diet. What do I need to know about a high-fiber diet?  It is better to get fiber through food sources rather than from fiber supplements. There is not a lot of research about how effective supplements are.  Always check the fiber content on the nutrition facts label of any prepackaged food. Look for foods that contain 5 g of fiber or more per serving.  Talk with a diet and nutrition specialist (  dietitian) if you have questions about specific foods that are recommended or not recommended for your medical condition, especially if those foods are not listed below.  Gradually increase how much fiber you consume. If you increase your intake of dietary fiber too quickly, you may have bloating, cramping, or gas.  Drink plenty of water. Water helps you to digest fiber. What are tips for following this plan?  Eat a wide  variety of high-fiber foods.  Make sure that half of the grains that you eat each day are whole grains.  Eat breads and cereals that are made with whole-grain flour instead of refined flour or white flour.  Eat brown rice, bulgur wheat, or millet instead of white rice.  Start the day with a breakfast that is high in fiber, such as a cereal that contains 5 g of fiber or more per serving.  Use beans in place of meat in soups, salads, and pasta dishes.  Eat high-fiber snacks, such as berries, raw vegetables, nuts, and popcorn.  Choose whole fruits and vegetables instead of processed forms like juice or sauce. What foods can I eat?  Fruits Berries. Pears. Apples. Oranges. Avocado. Prunes and raisins. Dried figs. Vegetables Sweet potatoes. Spinach. Kale. Artichokes. Cabbage. Broccoli. Cauliflower. Green peas. Carrots. Squash. Grains Whole-grain breads. Multigrain cereal. Oats and oatmeal. Brown rice. Barley. Bulgur wheat. Millet. Quinoa. Bran muffins. Popcorn. Rye wafer crackers. Meats and other proteins Navy, kidney, and pinto beans. Soybeans. Split peas. Lentils. Nuts and seeds. Dairy Fiber-fortified yogurt. Beverages Fiber-fortified soy milk. Fiber-fortified orange juice. Other foods Fiber bars. The items listed above may not be a complete list of recommended foods and beverages. Contact a dietitian for more options. What foods are not recommended? Fruits Fruit juice. Cooked, strained fruit. Vegetables Fried potatoes. Canned vegetables. Well-cooked vegetables. Grains White bread. Pasta made with refined flour. White rice. Meats and other proteins Fatty cuts of meat. Fried chicken or fried fish. Dairy Milk. Yogurt. Cream cheese. Sour cream. Fats and oils Butters. Beverages Soft drinks. Other foods Cakes and pastries. The items listed above may not be a complete list of foods and beverages to avoid. Contact a dietitian for more information. Summary  Fiber is a type of  carbohydrate. It is found in fruits, vegetables, whole grains, and beans.  There are many health benefits of eating a high-fiber diet, such as preventing constipation, lowering blood cholesterol, helping with weight loss, and reducing your risk of heart disease, diabetes, and certain cancers.  Gradually increase your intake of fiber. Increasing too fast can result in cramping, bloating, and gas. Drink plenty of water while you increase your fiber.  The best sources of fiber include whole fruits and vegetables, whole grains, nuts, seeds, and beans. This information is not intended to replace advice given to you by your health care provider. Make sure you discuss any questions you have with your health care provider. Document Revised: 10/26/2016 Document Reviewed: 10/26/2016 Elsevier Patient Education  2020 Elsevier Inc.   

## 2019-05-28 LAB — URINALYSIS W MICROSCOPIC + REFLEX CULTURE
Bilirubin Urine: NEGATIVE
Glucose, UA: NEGATIVE
Hyaline Cast: NONE SEEN /LPF
Ketones, ur: NEGATIVE
Nitrites, Initial: NEGATIVE
Protein, ur: NEGATIVE
Specific Gravity, Urine: 1.016 (ref 1.001–1.03)
Squamous Epithelial / HPF: NONE SEEN /HPF (ref <=5)
pH: <=5 (ref 5.0–8.0)

## 2019-05-28 LAB — URINE CULTURE
MICRO NUMBER:: 10510032
Result:: NO GROWTH
SPECIMEN QUALITY:: ADEQUATE

## 2019-05-28 LAB — CULTURE INDICATED

## 2019-05-30 ENCOUNTER — Other Ambulatory Visit: Payer: Self-pay | Admitting: Family Medicine

## 2019-06-06 ENCOUNTER — Ambulatory Visit (INDEPENDENT_AMBULATORY_CARE_PROVIDER_SITE_OTHER): Payer: Medicare Other | Admitting: Family Medicine

## 2019-06-06 ENCOUNTER — Encounter: Payer: Self-pay | Admitting: Family Medicine

## 2019-06-06 ENCOUNTER — Other Ambulatory Visit: Payer: Self-pay

## 2019-06-06 VITALS — BP 112/70 | HR 68 | Temp 97.6°F | Ht 67.0 in | Wt 225.2 lb

## 2019-06-06 DIAGNOSIS — I2510 Atherosclerotic heart disease of native coronary artery without angina pectoris: Secondary | ICD-10-CM

## 2019-06-06 DIAGNOSIS — N410 Acute prostatitis: Secondary | ICD-10-CM | POA: Diagnosis not present

## 2019-06-06 DIAGNOSIS — N402 Nodular prostate without lower urinary tract symptoms: Secondary | ICD-10-CM | POA: Diagnosis not present

## 2019-06-06 DIAGNOSIS — E118 Type 2 diabetes mellitus with unspecified complications: Secondary | ICD-10-CM

## 2019-06-06 LAB — POC URINALSYSI DIPSTICK (AUTOMATED)
Bilirubin, UA: NEGATIVE
Glucose, UA: NEGATIVE
Ketones, UA: NEGATIVE
Nitrite, UA: POSITIVE
Protein, UA: POSITIVE — AB
Spec Grav, UA: 1.025 (ref 1.010–1.025)
Urobilinogen, UA: 0.2 E.U./dL
pH, UA: 6 (ref 5.0–8.0)

## 2019-06-06 MED ORDER — SULFAMETHOXAZOLE-TRIMETHOPRIM 800-160 MG PO TABS
1.0000 | ORAL_TABLET | Freq: Two times a day (BID) | ORAL | 0 refills | Status: DC
Start: 2019-06-06 — End: 2020-01-29

## 2019-06-06 NOTE — Assessment & Plan Note (Addendum)
Benign per pt report. Records requested.

## 2019-06-06 NOTE — Assessment & Plan Note (Addendum)
Completed 2 wks bactrim 05/2019, symptoms resolved, rpt UCx no growth. However now this past weekend symptoms have started recurring - cloudy urine, itching at tip of penis with normal exam - and UA/micro concerning for recurrent infection. Will treat presumed prostatitis with 6wk bactrim course. He has urology appt pending next month as well.

## 2019-06-06 NOTE — Assessment & Plan Note (Signed)
Chronic, stable. Continue current regimen. Encouraged he schedule eye exam. Agrees to diabetes classes as well.

## 2019-06-06 NOTE — Patient Instructions (Addendum)
We will request records from urologist for recent prostate biopsy Consider diabetes classes, let me know if interested in referral.  Schedule eye exam as you're due.  Check on insurance preferred glucometer brand and let me know.  Urine test returned suggesting infection. Culture sent. Treat with 6 wk bactrim course. Keep follow up with Dr [name].

## 2019-06-06 NOTE — Progress Notes (Signed)
This visit was conducted in person.  BP 112/70 (BP Location: Left Arm, Patient Position: Sitting, Cuff Size: Large)   Pulse 68   Temp 97.6 F (36.4 C) (Temporal)   Ht 5\' 7"  (1.702 m)   Wt 225 lb 4 oz (102.2 kg)   SpO2 96%   BMI 35.28 kg/m    CC: DM f/u visit  Subjective:    Patient ID: Travis Howell, male    DOB: June 22, 1950, 69 y.o.   MRN: [id number]  HPI: Travis Howell is a 69 y.o. male presenting on 06/06/2019 for Hematuria (Here for 1 mo f/u.)   Brother and sister passed away last month.   Recent acute cystitis treated initially with 1 wk bactrim course, then that was extended 2 more wks to treat presumed prostatitis. Completed course, rpt UCx no growth. Symptoms did fully resolve, but again over the last 4-5 days symptoms have started recurring - cloudy urine, with itching at tip of penis. No dysuria, hematuria, fevers/chills abd pain or nausea. No rectal or perineal pain.   Benign prostate biopsy per report. Upcoming uro appt 07/2019.   DM - does regularly check sugars 130s fasting. Compliant with antihyperglycemic regimen which includes: metformin 500mg  daily. Denies low sugars or hypoglycemic symptoms. Denies paresthesias. Last diabetic eye exam DUE. Pneumovax: 10/2018. Prevnar: 10/2017. Glucometer brand: unsure. DSME: eligible. Lab Results  Component Value Date   HGBA1C 6.3 (A) 04/17/2019   Diabetic Foot Exam - Simple   Simple Foot Form Diabetic Foot exam was performed with the following findings: Yes 06/06/2019  9:37 AM  Visual Inspection No deformities, no ulcerations, no other skin breakdown bilaterally: Yes Sensation Testing Intact to touch and monofilament testing bilaterally: Yes Pulse Check See comments: Yes Comments Diminished pulses bilaterally    No results found for: Derl Barrow       Relevant past medical, surgical, family and social history reviewed and updated as indicated. Interim medical history since our last visit  reviewed. Allergies and medications reviewed and updated. Outpatient Medications Prior to Visit  Medication Sig Dispense Refill  . albuterol (VENTOLIN HFA) 108 (90 Base) MCG/ACT inhaler Inhale 2 puffs into the lungs every 6 (six) hours as needed for wheezing or shortness of breath. 18 g 3  . amLODipine-valsartan (EXFORGE) 5-160 MG tablet Take 0.5 tablets by mouth daily. 45 tablet 3  . aspirin 81 MG chewable tablet Chew 81 mg by mouth daily.     Marland Kitchen ezetimibe (ZETIA) 10 MG tablet Take 1 tablet (10 mg total) by mouth daily. 90 tablet 3  . FLUoxetine (PROZAC) 10 MG capsule Take 1 capsule (10 mg total) by mouth daily. 90 capsule 3  . metFORMIN (GLUCOPHAGE) 500 MG tablet TAKE 1 TABLET BY MOUTH EVERY DAY WITH BREAKFAST 90 tablet 1  . Omega-3 Fatty Acids (FISH OIL) 1000 MG CAPS Take 4,000 mg by mouth daily.    . Potassium 99 MG TABS Take 1-2 tablets (99-198 mg total) by mouth daily.    . rosuvastatin (CRESTOR) 5 MG tablet Take 1 tablet (5 mg total) by mouth daily. 90 tablet 3  . silodosin (RAPAFLO) 8 MG CAPS capsule Take 1 capsule (8 mg total) by mouth daily with breakfast. 90 capsule 3  . sulfamethoxazole-trimethoprim (BACTRIM DS) 800-160 MG tablet Take 1 tablet by mouth 2 (two) times daily. 28 tablet 0   No facility-administered medications prior to visit.     Per HPI unless specifically indicated in ROS section below Review of Systems Objective:  BP 112/70 (  BP Location: Left Arm, Patient Position: Sitting, Cuff Size: Large)   Pulse 68   Temp 97.6 F (36.4 C) (Temporal)   Ht 5\' 7"  (1.702 m)   Wt 225 lb 4 oz (102.2 kg)   SpO2 96%   BMI 35.28 kg/m   Wt Readings from Last 3 Encounters:  06/06/19 225 lb 4 oz (102.2 kg)  05/26/19 223 lb 3.2 oz (101.2 kg)  05/12/19 225 lb 4 oz (102.2 kg)      Physical Exam Vitals and nursing note reviewed.  Constitutional:      General: He is not in acute distress.    Appearance: Normal appearance. He is well-developed.  HENT:     Head: Normocephalic  and atraumatic.  Eyes:     General: No scleral icterus.    Extraocular Movements: Extraocular movements intact.     Conjunctiva/sclera: Conjunctivae normal.     Pupils: Pupils are equal, round, and reactive to light.  Cardiovascular:     Rate and Rhythm: Normal rate and regular rhythm.     Pulses: Normal pulses.     Heart sounds: Normal heart sounds. No murmur.  Pulmonary:     Effort: Pulmonary effort is normal. No respiratory distress.     Breath sounds: Normal breath sounds. No wheezing, rhonchi or rales.  Abdominal:     General: Abdomen is flat. Bowel sounds are normal. There is no distension.     Palpations: Abdomen is soft. There is no mass.     Tenderness: There is no abdominal tenderness. There is no right CVA tenderness, left CVA tenderness, guarding or rebound.     Hernia: No hernia is present.  Genitourinary:    Penis: Normal.      Testes: Normal.  Musculoskeletal:     Cervical back: Normal range of motion and neck supple.     Right lower leg: No edema.     Left lower leg: No edema.     Comments: See HPI for foot exam if done  Lymphadenopathy:     Cervical: No cervical adenopathy.  Skin:    General: Skin is warm and dry.     Findings: No rash.  Neurological:     Mental Status: He is alert.  Psychiatric:        Mood and Affect: Mood normal.        Behavior: Behavior normal.       Results for orders placed or performed in visit on 06/06/19  POCT Urinalysis Dipstick (Automated)  Result Value Ref Range   Color, UA yellow    Clarity, UA cloudy    Glucose, UA Negative Negative   Bilirubin, UA negative    Ketones, UA negative    Spec Grav, UA 1.025 1.010 - 1.025   Blood, UA 1+    pH, UA 6.0 5.0 - 8.0   Protein, UA Positive (A) Negative   Urobilinogen, UA 0.2 0.2 or 1.0 E.U./dL   Nitrite, UA positive    Leukocytes, UA Large (3+) (A) Negative   Lab Results  Component Value Date   CREATININE 1.01 05/12/2019   BUN 19 05/12/2019   NA 136 05/12/2019   K 3.9  05/12/2019   CL 99 05/12/2019   CO2 31 05/12/2019    Assessment & Plan:  This visit occurred during the SARS-CoV-2 public health emergency.  Safety protocols were in place, including screening questions prior to the visit, additional usage of staff PPE, and extensive cleaning of exam room while observing appropriate contact time as  indicated for disinfecting solutions.   Problem List Items Addressed This Visit    Prostate nodule    Benign per pt report. Records requested.       Controlled diabetes mellitus type 2 with complications (HCC) - Primary    Chronic, stable. Continue current regimen. Encouraged he schedule eye exam. Agrees to diabetes classes as well.       Relevant Orders   Ambulatory referral to diabetic education   Acute prostatitis    Completed 2 wks bactrim 05/2019, symptoms resolved, rpt UCx no growth. However now this past weekend symptoms have started recurring - cloudy urine, itching at tip of penis with normal exam - and UA/micro concerning for recurrent infection. Will treat presumed prostatitis with 6wk bactrim course. He has urology appt pending next month as well.       Relevant Orders   POCT Urinalysis Dipstick (Automated) (Completed)   Urine Culture       Meds ordered this encounter  Medications  . sulfamethoxazole-trimethoprim (BACTRIM DS) 800-160 MG tablet    Sig: Take 1 tablet by mouth 2 (two) times daily. 6 week course    Dispense:  90 tablet    Refill:  0   Orders Placed This Encounter  Procedures  . Urine Culture  . Ambulatory referral to diabetic education    Referral Priority:   Routine    Referral Type:   Consultation    Referral Reason:   Specialty Services Required    Number of Visits Requested:   1  . POCT Urinalysis Dipstick (Automated)    Patient Instructions  We will request records from urologist for recent prostate biopsy Consider diabetes classes, let me know if interested in referral.  Schedule eye exam as you're due.  Check  on insurance preferred glucometer brand and let me know.  Urine test returned suggesting infection. Culture sent. Treat with 6 wk bactrim course. Keep follow up with Dr [name].    Follow up plan: Return for follow up visit.  [name], MD

## 2019-06-08 LAB — URINE CULTURE
MICRO NUMBER:: 10538692
SPECIMEN QUALITY:: ADEQUATE

## 2019-06-12 ENCOUNTER — Other Ambulatory Visit: Payer: Self-pay | Admitting: Family Medicine

## 2019-06-12 DIAGNOSIS — N410 Acute prostatitis: Secondary | ICD-10-CM

## 2019-06-19 DIAGNOSIS — M1712 Unilateral primary osteoarthritis, left knee: Secondary | ICD-10-CM | POA: Diagnosis not present

## 2019-06-19 DIAGNOSIS — M1711 Unilateral primary osteoarthritis, right knee: Secondary | ICD-10-CM | POA: Diagnosis not present

## 2019-06-23 ENCOUNTER — Other Ambulatory Visit: Payer: Self-pay

## 2019-06-26 ENCOUNTER — Other Ambulatory Visit: Payer: Self-pay

## 2019-06-26 ENCOUNTER — Other Ambulatory Visit (INDEPENDENT_AMBULATORY_CARE_PROVIDER_SITE_OTHER): Payer: Medicare Other

## 2019-06-26 DIAGNOSIS — N410 Acute prostatitis: Secondary | ICD-10-CM | POA: Diagnosis not present

## 2019-06-26 LAB — CBC WITH DIFFERENTIAL/PLATELET
Basophils Absolute: 0 10*3/uL (ref 0.0–0.1)
Basophils Relative: 0.7 % (ref 0.0–3.0)
Eosinophils Absolute: 0.3 10*3/uL (ref 0.0–0.7)
Eosinophils Relative: 6.1 % — ABNORMAL HIGH (ref 0.0–5.0)
HCT: 37.2 % — ABNORMAL LOW (ref 39.0–52.0)
Hemoglobin: 12.7 g/dL — ABNORMAL LOW (ref 13.0–17.0)
Lymphocytes Relative: 26.2 % (ref 12.0–46.0)
Lymphs Abs: 1.3 10*3/uL (ref 0.7–4.0)
MCHC: 34.3 g/dL (ref 30.0–36.0)
MCV: 99 fl (ref 78.0–100.0)
Monocytes Absolute: 0.4 10*3/uL (ref 0.1–1.0)
Monocytes Relative: 7.5 % (ref 3.0–12.0)
Neutro Abs: 2.9 10*3/uL (ref 1.4–7.7)
Neutrophils Relative %: 59.5 % (ref 43.0–77.0)
Platelets: 181 10*3/uL (ref 150.0–400.0)
RBC: 3.75 Mil/uL — ABNORMAL LOW (ref 4.22–5.81)
RDW: 13.8 % (ref 11.5–15.5)
WBC: 4.9 10*3/uL (ref 4.0–10.5)

## 2019-06-26 LAB — BASIC METABOLIC PANEL
BUN: 21 mg/dL (ref 6–23)
CO2: 31 mEq/L (ref 19–32)
Calcium: 9.2 mg/dL (ref 8.4–10.5)
Chloride: 101 mEq/L (ref 96–112)
Creatinine, Ser: 1.03 mg/dL (ref 0.40–1.50)
GFR: 71.87 mL/min (ref >60.00)
Glucose, Bld: 113 mg/dL — ABNORMAL HIGH (ref 70–99)
Potassium: 4.5 mEq/L (ref 3.5–5.1)
Sodium: 139 mEq/L (ref 135–145)

## 2019-07-05 DIAGNOSIS — H5213 Myopia, bilateral: Secondary | ICD-10-CM | POA: Diagnosis not present

## 2019-07-05 DIAGNOSIS — H52203 Unspecified astigmatism, bilateral: Secondary | ICD-10-CM | POA: Diagnosis not present

## 2019-07-05 DIAGNOSIS — Z961 Presence of intraocular lens: Secondary | ICD-10-CM | POA: Diagnosis not present

## 2019-07-05 DIAGNOSIS — H0102A Squamous blepharitis right eye, upper and lower eyelids: Secondary | ICD-10-CM | POA: Diagnosis not present

## 2019-07-05 DIAGNOSIS — H524 Presbyopia: Secondary | ICD-10-CM | POA: Diagnosis not present

## 2019-07-05 DIAGNOSIS — H0102B Squamous blepharitis left eye, upper and lower eyelids: Secondary | ICD-10-CM | POA: Diagnosis not present

## 2019-07-05 DIAGNOSIS — H26491 Other secondary cataract, right eye: Secondary | ICD-10-CM | POA: Diagnosis not present

## 2019-07-05 DIAGNOSIS — E119 Type 2 diabetes mellitus without complications: Secondary | ICD-10-CM | POA: Diagnosis not present

## 2019-07-05 LAB — HM DIABETES EYE EXAM

## 2019-07-07 DIAGNOSIS — N402 Nodular prostate without lower urinary tract symptoms: Secondary | ICD-10-CM | POA: Diagnosis not present

## 2019-07-14 DIAGNOSIS — N3000 Acute cystitis without hematuria: Secondary | ICD-10-CM | POA: Diagnosis not present

## 2019-07-26 ENCOUNTER — Encounter: Payer: Self-pay | Admitting: Family Medicine

## 2019-07-31 ENCOUNTER — Other Ambulatory Visit: Payer: Self-pay | Admitting: Orthopedic Surgery

## 2019-07-31 NOTE — Telephone Encounter (Signed)
[facility] called needing clearance for this patient.  Stated they received notes but it doesn't say he is cleared.  They are trying to schedule him for surgery.  You can reach [place] at [phone].

## 2019-07-31 NOTE — Telephone Encounter (Signed)
   Primary Cardiologist: Larae Grooms, MD  Chart reviewed as part of pre-operative protocol coverage. Patient was contacted 07/31/2019 in reference to pre-operative risk assessment for pending surgery as outlined below.  Travis Howell was last seen on 05/26/2019 by Dr. Irish Lack.  Since that day, Travis Howell has done well without chest pain or worsening dyspnea on exertion.  Therefore, based on ACC/AHA guidelines, the patient would be at acceptable risk for the planned procedure without further cardiovascular testing.   I will route this recommendation to the requesting party via Epic fax function and remove from pre-op pool. Please call with questions.  We would prefer the patient continue on the 81 mg aspirin through the surgery, however if absolutely need to stop the aspirin, he may stop it for 5-7 days before and start as soon as possible afterward at the surgeon's discretion.   [place], [place] 07/31/2019, 5:37 PM

## 2019-09-25 NOTE — Progress Notes (Addendum)
COVID Vaccine Completed: x2 Date COVID Vaccine completed: 02-11-19 & 03-04-19 COVID vaccine manufacturer: [employer]   PCP - [name], MD Cardiologist - Larae Grooms, MD.  Last OV 05-26-19  Clearance in chart dated 07-31-19 from [place], [place]  Chest x-ray - 05-12-19 in Epic EKG - 05-18-19 in Epic  Stress Test -  ECHO - 08-27-15 in Epic Cardiac Cath - 2011 Pacemaker/ICD device last checked:  Sleep Study -  CPAP -   Fasting Blood Sugar - 110 - 140 Checks Blood Sugar - once a week  Blood Thinner Instructions: Aspirin Instructions:  ASA 81 mg.  Pt to check with surgeon Last Dose:  Anesthesia review: MI with stent placement, CAD, Dyspnea with exertion  Patient denies shortness of breath, fever, cough and chest pain at PAT appointment   Patient verbalized understanding of instructions that were given to them at the PAT appointment. Patient was also instructed that they will need to review over the PAT instructions again at home before surgery.

## 2019-09-25 NOTE — Patient Instructions (Addendum)
DUE TO COVID-19 ONLY ONE VISITOR IS ALLOWED TO COME WITH YOU AND STAY IN THE WAITING ROOM ONLY  DURING PRE OP AND PROCEDURE.   IF YOU WILL BE ADMITTED INTO THE HOSPITAL YOU ARE ALLOWED ONE SUPPORT PERSON DURING VISITATION  HOURS ONLY (10AM -8PM)    The support person may change daily.  The support person must pass our screening, gel in and out, and wear a mask at all times, including in the patients room.  Patients must also wear a mask when staff or their support person are in the room.   COVID SWAB TESTING MUST BE COMPLETED ON:  Monday, 10-02-19 @ 10:10 AM   [address] [place], [place] [address]  (Must self quarantine after testing. Follow instructions on handout.)         Your procedure is scheduled on: Thursday, 10-05-19   Report to [facility] Main  Entrance   Report to Short Stay at 5:30 AM   [facility] : [facility])    Call this number if you have problems the morning of surgery [phone]   Do not eat food :After Midnight.   May have liquids until 4:30 AM  day of surgery  CLEAR LIQUID DIET  Foods Allowed                                                                     Foods Excluded  Water, Black Coffee and tea, regular and decaf             liquids that you cannot  Plain Jell-O in any flavor  (No red)                                   see through such as: Fruit ices (not with fruit pulp)                                      milk, soups, orange juice              Iced Popsicles (No red)                                      All solid food                                   Apple juices Sports drinks like Gatorade (No red) Lightly seasoned clear broth or consume(fat free) Sugar, honey syrup     Complete one G2 drink the morning of surgery at 4:30 AM the day of surgery.    Oral Hygiene is also important to reduce your risk of infection.                                    Remember - BRUSH YOUR TEETH THE MORNING OF SURGERY WITH YOUR REGULAR TOOTHPASTE   Do NOT  smoke after Midnight  Take these medicines the morning of surgery with A SIP OF WATER: Ezetimibe, Fluoxetine, Rosuvastatin, Silodosin.  Okay  to use inhalers and bring with you day of surgery                    How to Manage Your Diabetes Before and After Surgery  Why is it important to control my blood sugar before and after surgery?  Improving blood sugar levels before and after surgery helps healing and can limit problems.  A way of improving blood sugar control is eating a healthy diet by: o  Eating less sugar and carbohydrates o  Increasing activity/exercise o  Talking with your doctor about reaching your blood sugar goals  High blood sugars (greater than 180 mg/dL) can raise your risk of infections and slow your recovery, so you will need to focus on controlling your diabetes during the weeks before surgery.  Make sure that the doctor who takes care of your diabetes knows about your planned surgery including the date and location.  How do I manage my blood sugar before surgery?  Check your blood sugar at least 4 times a day, starting 2 days before surgery, to make sure that the level is not too high or low. o Check your blood sugar the morning of your surgery when you wake up and every 2 hours until you get to the Short Stay unit.  If your blood sugar is less than 70 mg/dL, you will need to treat for low blood sugar: o Do not take insulin. o Treat a low blood sugar (less than 70 mg/dL) with  cup of clear juice (cranberry or apple), 4 glucose tablets, OR glucose gel. o Recheck blood sugar in 15 minutes after treatment (to make sure it is greater than 70 mg/dL). If your blood sugar is not greater than 70 mg/dL on recheck, call [phone] for further instructions.  Report your blood sugar to the short stay nurse when you get to Short Stay.   If you are admitted to the hospital after surgery: o Your blood sugar will be checked by the staff and you will probably be given  insulin after surgery (instead of oral diabetes medicines) to make sure you have good blood sugar levels. o The goal for blood sugar control after surgery is 80-180 mg/dL.   WHAT DO I DO ABOUT MY DIABETES MEDICATION?   Do not take oral diabetes medicines (pills) the morning of surgery.   THE DAY BEFORE SURGERY:  Take Metformin as prescribed.        THE MORNING OF SURGERY:  Do not take Metformin.   Reviewed and Endorsed by [facility] Patient Education Committee, August 2015              You may not have any metal on your body including jewelry, and body piercings             Do not wear lotions, powders, perfumes/cologne, or deodorant             Men may shave face and neck.   Do not bring valuables to the hospital. [place].   Contacts, dentures or bridgework may not be worn into surgery.      Patients discharged the day of surgery will not be allowed to drive home.                Please read over the following fact sheets you were given:  IF YOU HAVE QUESTIONS ABOUT YOUR PRE OP INSTRUCTIONS PLEASE CALL [phone]   [place]- Preparing for Total Shoulder Arthroplasty    Before surgery, you can play an important role. Because skin is not sterile, your skin needs to be as free of germs as possible. You can reduce the number of germs on your skin by using the following products.  Benzoyl Peroxide Gel o Reduces the number of germs present on the skin o Applied twice a day to shoulder area starting two days before surgery    ==================================================================  Please follow these instructions carefully:  BENZOYL PEROXIDE 5% GEL  Please do not use if you have an allergy to benzoyl peroxide.   If your skin becomes reddened/irritated stop using the benzoyl peroxide.  Starting two days before surgery, apply as follows: 1. Apply benzoyl peroxide in the morning and at night. Apply after taking a shower.  If you are not taking a shower clean entire shoulder front, back, and side along with the armpit with a clean wet washcloth.  2. Place a quarter-sized dollop on your shoulder and rub in thoroughly, making sure to cover the front, back, and side of your shoulder, along with the armpit.   2 days before ____ AM   ____ PM              1 day before ____ AM   ____ PM                         3. Do this twice a day for two days.  (Last application is the night before surgery, AFTER using the CHG soap as described below).  4. Do NOT apply benzoyl peroxide gel on the day of surgery. [place] - Preparing for Surgery Before surgery, you can play an important role.  Because skin is not sterile, your skin needs to be as free of germs as possible.  You can reduce the number of germs on your skin by washing with CHG (chlorahexidine gluconate) soap before surgery.  CHG is an antiseptic cleaner which kills germs and bonds with the skin to continue killing germs even after washing. Please DO NOT use if you have an allergy to CHG or antibacterial soaps.  If your skin becomes reddened/irritated stop using the CHG and inform your nurse when you arrive at Short Stay. Do not shave (including legs and underarms) for at least 48 hours prior to the first CHG shower.  You may shave your face/neck.  Please follow these instructions carefully:  1.  Shower with CHG Soap the night before surgery and the  morning of surgery.  2.  If you choose to wash your hair, wash your hair first as usual with your normal  shampoo.  3.  After you shampoo, rinse your hair and body thoroughly to remove the shampoo.                             4.  Use CHG as you would any other liquid soap.  You can apply chg directly to the skin and wash.  Gently with a scrungie or clean washcloth.  5.  Apply the CHG Soap to your body ONLY FROM THE NECK DOWN.   Do   not use on face/ open  Wound or open sores. Avoid contact with eyes,  ears mouth and   genitals (private parts).                       Wash face,  Genitals (private parts) with your normal soap.             6.  Wash thoroughly, paying special attention to the area where your    surgery  will be performed.  7.  Thoroughly rinse your body with warm water from the neck down.  8.  DO NOT shower/wash with your normal soap after using and rinsing off the CHG Soap.                9.  Pat yourself dry with a clean towel.            10.  Wear clean pajamas.            11.  Place clean sheets on your bed the night of your first shower and do not  sleep with pets. Day of Surgery : Do not apply any lotions/deodorants the morning of surgery.  Please wear clean clothes to the hospital/surgery center.  FAILURE TO FOLLOW THESE INSTRUCTIONS MAY RESULT IN THE CANCELLATION OF YOUR SURGERY  PATIENT SIGNATURE_________________________________  NURSE SIGNATURE__________________________________  ________________________________________________________________________   Adam Phenix  An incentive spirometer is a tool that can help keep your lungs clear and active. This tool measures how well you are filling your lungs with each breath. Taking long deep breaths may help reverse or decrease the chance of developing breathing (pulmonary) problems (especially infection) following:  A long period of time when you are unable to move or be active. BEFORE THE PROCEDURE   If the spirometer includes an indicator to show your best effort, your nurse or respiratory therapist will set it to a desired goal.  If possible, sit up straight or lean slightly forward. Try not to slouch.  Hold the incentive spirometer in an upright position. INSTRUCTIONS FOR USE  1. Sit on the edge of your bed if possible, or sit up as far as you can in bed or on a chair. 2. Hold the incentive spirometer in an upright position. 3. Breathe out normally. 4. Place the mouthpiece in your mouth and seal your  lips tightly around it. 5. Breathe in slowly and as deeply as possible, raising the piston or the ball toward the top of the column. 6. Hold your breath for 3-5 seconds or for as long as possible. Allow the piston or ball to fall to the bottom of the column. 7. Remove the mouthpiece from your mouth and breathe out normally. 8. Rest for a few seconds and repeat Steps 1 through 7 at least 10 times every 1-2 hours when you are awake. Take your time and take a few normal breaths between deep breaths. 9. The spirometer may include an indicator to show your best effort. Use the indicator as a goal to work toward during each repetition. 10. After each set of 10 deep breaths, practice coughing to be sure your lungs are clear. If you have an incision (the cut made at the time of surgery), support your incision when coughing by placing a pillow or rolled up towels firmly against it. Once you are able to get out of bed, walk around indoors and cough well. You may stop using the incentive spirometer when instructed by your caregiver.  RISKS AND COMPLICATIONS  Take your  time so you do not get dizzy or light-headed.  If you are in pain, you may need to take or ask for pain medication before doing incentive spirometry. It is harder to take a deep breath if you are having pain. AFTER USE  Rest and breathe slowly and easily.  It can be helpful to keep track of a log of your progress. Your caregiver can provide you with a simple table to help with this. If you are using the spirometer at home, follow these instructions: [place] IF:   You are having difficultly using the spirometer.  You have trouble using the spirometer as often as instructed.  Your pain medication is not giving enough relief while using the spirometer.  You develop fever of 100.5 F (38.1 C) or higher. SEEK IMMEDIATE MEDICAL CARE IF:   You cough up bloody sputum that had not been present before.  You develop fever of 102 F  (38.9 C) or greater.  You develop worsening pain at or near the incision site. MAKE SURE YOU:   Understand these instructions.  Will watch your condition.  Will get help right away if you are not doing well or get worse. Document Released: 05/04/2006 Document Revised: 03/16/2011 Document Reviewed: 07/05/2006 ExitCare Patient Information 2014 ExitCare, [place].   ________________________________________________________________________  WHAT IS A BLOOD TRANSFUSION? Blood Transfusion Information  A transfusion is the replacement of blood or some of its parts. Blood is made up of multiple cells which provide different functions.  Red blood cells carry oxygen and are used for blood loss replacement.  White blood cells fight against infection.  Platelets control bleeding.  Plasma helps clot blood.  Other blood products are available for specialized needs, such as hemophilia or other clotting disorders. BEFORE THE TRANSFUSION  Who gives blood for transfusions?   Healthy volunteers who are fully evaluated to make sure their blood is safe. This is blood bank blood. Transfusion therapy is the safest it has ever been in the practice of medicine. Before blood is taken from a donor, a complete history is taken to make sure that person has no history of diseases nor engages in risky social behavior (examples are intravenous drug use or sexual activity with multiple partners). The donor's travel history is screened to minimize risk of transmitting infections, such as malaria. The donated blood is tested for signs of infectious diseases, such as HIV and hepatitis. The blood is then tested to be sure it is compatible with you in order to minimize the chance of a transfusion reaction. If you or a relative donates blood, this is often done in anticipation of surgery and is not appropriate for emergency situations. It takes many days to process the donated blood. RISKS AND COMPLICATIONS Although  transfusion therapy is very safe and saves many lives, the main dangers of transfusion include:   Getting an infectious disease.  Developing a transfusion reaction. This is an allergic reaction to something in the blood you were given. Every precaution is taken to prevent this. The decision to have a blood transfusion has been considered carefully by your caregiver before blood is given. Blood is not given unless the benefits outweigh the risks. AFTER THE TRANSFUSION  Right after receiving a blood transfusion, you will usually feel much better and more energetic. This is especially true if your red blood cells have gotten low (anemic). The transfusion raises the level of the red blood cells which carry oxygen, and this usually causes an energy increase.  The nurse administering the transfusion will monitor you carefully for complications. HOME CARE INSTRUCTIONS  No special instructions are needed after a transfusion. You may find your energy is better. Speak with your caregiver about any limitations on activity for underlying diseases you may have. SEEK MEDICAL CARE IF:   Your condition is not improving after your transfusion.  You develop redness or irritation at the intravenous (IV) site. SEEK IMMEDIATE MEDICAL CARE IF:  Any of the following symptoms occur over the next 12 hours:  Shaking chills.  You have a temperature by mouth above 102 F (38.9 C), not controlled by medicine.  Chest, back, or muscle pain.  People around you feel you are not acting correctly or are confused.  Shortness of breath or difficulty breathing.  Dizziness and fainting.  You get a rash or develop hives.  You have a decrease in urine output.  Your urine turns a dark color or changes to pink, red, or brown. Any of the following symptoms occur over the next 10 days:  You have a temperature by mouth above 102 F (38.9 C), not controlled by medicine.  Shortness of breath.  Weakness after normal  activity.  The white part of the eye turns yellow (jaundice).  You have a decrease in the amount of urine or are urinating less often.  Your urine turns a dark color or changes to pink, red, or brown. Document Released: 12/20/1999 Document Revised: 03/16/2011 Document Reviewed: 08/08/2007 [facility] Patient Information 2014 [place], [place].  _______________________________________________________________________

## 2019-09-27 ENCOUNTER — Encounter (HOSPITAL_COMMUNITY): Payer: Self-pay

## 2019-09-27 ENCOUNTER — Encounter (HOSPITAL_COMMUNITY)
Admission: RE | Admit: 2019-09-27 | Discharge: 2019-09-27 | Disposition: A | Discharge status: Home / Self Care | Payer: Medicare Other | Source: Ambulatory Visit | Attending: Orthopedic Surgery | Admitting: Orthopedic Surgery

## 2019-09-27 ENCOUNTER — Other Ambulatory Visit: Payer: Self-pay

## 2019-09-27 DIAGNOSIS — Z01812 Encounter for preprocedural laboratory examination: Secondary | ICD-10-CM | POA: Insufficient documentation

## 2019-09-27 LAB — URINALYSIS, ROUTINE W REFLEX MICROSCOPIC
Bilirubin Urine: NEGATIVE
Glucose, UA: NEGATIVE mg/dL
Ketones, ur: NEGATIVE mg/dL
Nitrite: NEGATIVE
Protein, ur: 30 mg/dL — AB
RBC / HPF: >50 RBC/hpf — ABNORMAL HIGH (ref 0–5)
Specific Gravity, Urine: 1.02 (ref 1.005–1.030)
pH: 5 (ref 5.0–8.0)

## 2019-09-27 LAB — CBC WITH DIFFERENTIAL/PLATELET
Abs Immature Granulocytes: 0.02 10*3/uL (ref 0.00–0.07)
Basophils Absolute: 0 10*3/uL (ref 0.0–0.1)
Basophils Relative: 1 %
Eosinophils Absolute: 0.3 10*3/uL (ref 0.0–0.5)
Eosinophils Relative: 5 %
HCT: 37.5 % — ABNORMAL LOW (ref 39.0–52.0)
Hemoglobin: 12.9 g/dL — ABNORMAL LOW (ref 13.0–17.0)
Immature Granulocytes: 0 %
Lymphocytes Relative: 23 %
Lymphs Abs: 1.3 10*3/uL (ref 0.7–4.0)
MCH: 34.3 pg — ABNORMAL HIGH (ref 26.0–34.0)
MCHC: 34.4 g/dL (ref 30.0–36.0)
MCV: 99.7 fL (ref 80.0–100.0)
Monocytes Absolute: 0.4 10*3/uL (ref 0.1–1.0)
Monocytes Relative: 7 %
Neutro Abs: 3.4 10*3/uL (ref 1.7–7.7)
Neutrophils Relative %: 64 %
Platelets: 165 10*3/uL (ref 150–400)
RBC: 3.76 MIL/uL — ABNORMAL LOW (ref 4.22–5.81)
RDW: 12.4 % (ref 11.5–15.5)
WBC: 5.3 10*3/uL (ref 4.0–10.5)
nRBC: 0 % (ref 0.0–0.2)

## 2019-09-27 LAB — COMPREHENSIVE METABOLIC PANEL
ALT: 17 U/L (ref 0–44)
AST: 27 U/L (ref 15–41)
Albumin: 4.4 g/dL (ref 3.5–5.0)
Alkaline Phosphatase: 45 U/L (ref 38–126)
Anion gap: 8 (ref 5–15)
BUN: 14 mg/dL (ref 8–23)
CO2: 28 mmol/L (ref 22–32)
Calcium: 9.4 mg/dL (ref 8.9–10.3)
Chloride: 104 mmol/L (ref 98–111)
Creatinine, Ser: 0.76 mg/dL (ref 0.61–1.24)
GFR calc Af Amer: >60 mL/min (ref >60)
GFR calc non Af Amer: >60 mL/min (ref >60)
Glucose, Bld: 122 mg/dL — ABNORMAL HIGH (ref 70–99)
Potassium: 4 mmol/L (ref 3.5–5.1)
Sodium: 140 mmol/L (ref 135–145)
Total Bilirubin: 0.6 mg/dL (ref 0.3–1.2)
Total Protein: 7.5 g/dL (ref 6.5–8.1)

## 2019-09-27 LAB — HEMOGLOBIN A1C
Hgb A1c MFr Bld: 6.4 % — ABNORMAL HIGH (ref 4.8–5.6)
Mean Plasma Glucose: 136.98 mg/dL

## 2019-09-27 LAB — SURGICAL PCR SCREEN
MRSA, PCR: NEGATIVE
Staphylococcus aureus: NEGATIVE

## 2019-09-27 LAB — PROTIME-INR
INR: 1 (ref 0.8–1.2)
Prothrombin Time: 12.4 seconds (ref 11.4–15.2)

## 2019-09-27 LAB — APTT: aPTT: 31 seconds (ref 24–36)

## 2019-09-27 LAB — GLUCOSE, CAPILLARY: Glucose-Capillary: 135 mg/dL — ABNORMAL HIGH (ref 70–99)

## 2019-09-28 NOTE — Anesthesia Preprocedure Evaluation (Addendum)
Anesthesia Evaluation  Patient identified by MRN, date of birth, ID band Patient awake    Reviewed: Allergy & Precautions, NPO status , Patient's Chart, lab work & pertinent test results  Airway Mallampati: II  TM Distance: >3 FB Neck ROM: Full    Dental no notable dental hx.    Pulmonary neg pulmonary ROS, former smoker,    Pulmonary exam normal breath sounds clear to auscultation       Cardiovascular hypertension, + CAD, + Past MI and + Cardiac Stents  Normal cardiovascular exam Rhythm:Regular Rate:Normal     Neuro/Psych negative neurological ROS  negative psych ROS   GI/Hepatic negative GI ROS, Neg liver ROS,   Endo/Other  diabetes  Renal/GU negative Renal ROS  negative genitourinary   Musculoskeletal negative musculoskeletal ROS (+)   Abdominal   Peds negative pediatric ROS (+)  Hematology negative hematology ROS (+)   Anesthesia Other Findings   Reproductive/Obstetrics negative OB ROS                            Anesthesia Physical Anesthesia Plan  ASA: III  Anesthesia Plan: General   Post-op Pain Management:  Regional for Post-op pain   Induction: Intravenous  PONV Risk Score and Plan: 2 and Ondansetron and Dexamethasone  Airway Management Planned: Oral ETT  Additional Equipment:   Intra-op Plan:   Post-operative Plan: Extubation in OR  Informed Consent: I have reviewed the patients History and Physical, chart, labs and discussed the procedure including the risks, benefits and alternatives for the proposed anesthesia with the patient or authorized representative who has indicated his/her understanding and acceptance.     Dental advisory given  Plan Discussed with: CRNA and Surgeon  Anesthesia Plan Comments: (See PAT note 09/27/2019, Konrad Felix, PA-C)       Anesthesia Quick Evaluation
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
## 2019-10-02 ENCOUNTER — Other Ambulatory Visit (HOSPITAL_COMMUNITY)
Admission: RE | Admit: 2019-10-02 | Discharge: 2019-10-02 | Disposition: A | Discharge status: Home / Self Care | Payer: Medicare Other | Source: Ambulatory Visit | Attending: Orthopedic Surgery | Admitting: Orthopedic Surgery

## 2019-10-02 DIAGNOSIS — Z20822 Contact with and (suspected) exposure to covid-19: Secondary | ICD-10-CM | POA: Diagnosis not present

## 2019-10-02 DIAGNOSIS — Z01812 Encounter for preprocedural laboratory examination: Secondary | ICD-10-CM | POA: Insufficient documentation

## 2019-10-02 LAB — SARS CORONAVIRUS 2 (TAT 6-24 HRS): SARS Coronavirus 2: NEGATIVE

## 2019-10-05 ENCOUNTER — Ambulatory Visit (HOSPITAL_COMMUNITY): Payer: Medicare Other | Admitting: Certified Registered"

## 2019-10-05 ENCOUNTER — Ambulatory Visit (HOSPITAL_COMMUNITY): Payer: Medicare Other

## 2019-10-05 ENCOUNTER — Other Ambulatory Visit: Payer: Self-pay

## 2019-10-05 ENCOUNTER — Ambulatory Visit (HOSPITAL_COMMUNITY)
Admission: RE | Admit: 2019-10-05 | Discharge: 2019-10-05 | Disposition: A | Discharge status: Home / Self Care | Payer: Medicare Other | Attending: Orthopedic Surgery | Admitting: Orthopedic Surgery

## 2019-10-05 ENCOUNTER — Ambulatory Visit (HOSPITAL_COMMUNITY): Payer: Medicare Other | Admitting: Physician Assistant

## 2019-10-05 ENCOUNTER — Encounter (HOSPITAL_COMMUNITY)
Admission: RE | Disposition: A | Discharge status: Home / Self Care | Payer: Self-pay | Source: Home / Self Care | Attending: Orthopedic Surgery

## 2019-10-05 ENCOUNTER — Encounter (HOSPITAL_COMMUNITY): Payer: Self-pay | Admitting: Orthopedic Surgery

## 2019-10-05 DIAGNOSIS — N138 Other obstructive and reflux uropathy: Secondary | ICD-10-CM | POA: Diagnosis not present

## 2019-10-05 DIAGNOSIS — E785 Hyperlipidemia, unspecified: Secondary | ICD-10-CM | POA: Diagnosis not present

## 2019-10-05 DIAGNOSIS — Z96611 Presence of right artificial shoulder joint: Secondary | ICD-10-CM | POA: Insufficient documentation

## 2019-10-05 DIAGNOSIS — G8918 Other acute postprocedural pain: Secondary | ICD-10-CM | POA: Diagnosis not present

## 2019-10-05 DIAGNOSIS — M19012 Primary osteoarthritis, left shoulder: Secondary | ICD-10-CM | POA: Diagnosis not present

## 2019-10-05 DIAGNOSIS — F419 Anxiety disorder, unspecified: Secondary | ICD-10-CM | POA: Insufficient documentation

## 2019-10-05 DIAGNOSIS — Z7984 Long term (current) use of oral hypoglycemic drugs: Secondary | ICD-10-CM | POA: Insufficient documentation

## 2019-10-05 DIAGNOSIS — I2510 Atherosclerotic heart disease of native coronary artery without angina pectoris: Secondary | ICD-10-CM | POA: Diagnosis not present

## 2019-10-05 DIAGNOSIS — Z471 Aftercare following joint replacement surgery: Secondary | ICD-10-CM | POA: Diagnosis not present

## 2019-10-05 DIAGNOSIS — F329 Major depressive disorder, single episode, unspecified: Secondary | ICD-10-CM | POA: Diagnosis not present

## 2019-10-05 DIAGNOSIS — Z87891 Personal history of nicotine dependence: Secondary | ICD-10-CM | POA: Insufficient documentation

## 2019-10-05 DIAGNOSIS — Z79899 Other long term (current) drug therapy: Secondary | ICD-10-CM | POA: Insufficient documentation

## 2019-10-05 DIAGNOSIS — J9811 Atelectasis: Secondary | ICD-10-CM | POA: Diagnosis not present

## 2019-10-05 DIAGNOSIS — J60 Coalworker's pneumoconiosis: Secondary | ICD-10-CM | POA: Insufficient documentation

## 2019-10-05 DIAGNOSIS — Z7982 Long term (current) use of aspirin: Secondary | ICD-10-CM | POA: Diagnosis not present

## 2019-10-05 DIAGNOSIS — Z955 Presence of coronary angioplasty implant and graft: Secondary | ICD-10-CM | POA: Diagnosis not present

## 2019-10-05 DIAGNOSIS — I10 Essential (primary) hypertension: Secondary | ICD-10-CM | POA: Insufficient documentation

## 2019-10-05 DIAGNOSIS — I252 Old myocardial infarction: Secondary | ICD-10-CM | POA: Diagnosis not present

## 2019-10-05 DIAGNOSIS — E119 Type 2 diabetes mellitus without complications: Secondary | ICD-10-CM | POA: Diagnosis not present

## 2019-10-05 DIAGNOSIS — Z96612 Presence of left artificial shoulder joint: Secondary | ICD-10-CM | POA: Diagnosis not present

## 2019-10-05 DIAGNOSIS — Z01811 Encounter for preprocedural respiratory examination: Secondary | ICD-10-CM

## 2019-10-05 DIAGNOSIS — G2581 Restless legs syndrome: Secondary | ICD-10-CM | POA: Insufficient documentation

## 2019-10-05 DIAGNOSIS — M25712 Osteophyte, left shoulder: Secondary | ICD-10-CM | POA: Diagnosis not present

## 2019-10-05 DIAGNOSIS — Z888 Allergy status to other drugs, medicaments and biological substances status: Secondary | ICD-10-CM | POA: Insufficient documentation

## 2019-10-05 DIAGNOSIS — N401 Enlarged prostate with lower urinary tract symptoms: Secondary | ICD-10-CM | POA: Insufficient documentation

## 2019-10-05 HISTORY — PX: TOTAL SHOULDER ARTHROPLASTY: SHX126

## 2019-10-05 LAB — TYPE AND SCREEN
ABO/RH(D): A POS
Antibody Screen: NEGATIVE

## 2019-10-05 LAB — GLUCOSE, CAPILLARY: Glucose-Capillary: 136 mg/dL — ABNORMAL HIGH (ref 70–99)

## 2019-10-05 SURGERY — ARTHROPLASTY, SHOULDER, TOTAL
Anesthesia: General | Site: Shoulder | Laterality: Left

## 2019-10-05 MED ORDER — CEFAZOLIN SODIUM-DEXTROSE 2-4 GM/100ML-% IV SOLN
2.0000 g | INTRAVENOUS | Status: AC
  Administered 2019-10-05: 2 g via INTRAVENOUS
  Filled 2019-10-05: qty 100

## 2019-10-05 MED ORDER — HYDROMORPHONE HCL 1 MG/ML IJ SOLN: 0.2500 mg | INTRAMUSCULAR | Status: DC | PRN

## 2019-10-05 MED ORDER — MIDAZOLAM HCL 2 MG/2ML IJ SOLN
INTRAMUSCULAR | Status: DC | PRN
  Administered 2019-10-05: 2 mg via INTRAVENOUS

## 2019-10-05 MED ORDER — PROPOFOL 10 MG/ML IV BOLUS
INTRAVENOUS | Status: AC
  Filled 2019-10-05: qty 20

## 2019-10-05 MED ORDER — BUPIVACAINE HCL (PF) 0.5 % IJ SOLN
INTRAMUSCULAR | Status: DC | PRN
  Administered 2019-10-05: 15 mL via PERINEURAL

## 2019-10-05 MED ORDER — PROMETHAZINE HCL 25 MG/ML IJ SOLN: 6.2500 mg | INTRAMUSCULAR | Status: DC | PRN

## 2019-10-05 MED ORDER — GLYCOPYRROLATE PF 0.2 MG/ML IJ SOSY
PREFILLED_SYRINGE | INTRAMUSCULAR | Status: AC
  Filled 2019-10-05: qty 1

## 2019-10-05 MED ORDER — GLYCOPYRROLATE 0.2 MG/ML IJ SOLN
INTRAMUSCULAR | Status: DC | PRN
  Administered 2019-10-05: .2 mg via INTRAVENOUS

## 2019-10-05 MED ORDER — SUGAMMADEX SODIUM 200 MG/2ML IV SOLN
INTRAVENOUS | Status: DC | PRN
  Administered 2019-10-05: 200 mg via INTRAVENOUS

## 2019-10-05 MED ORDER — TIZANIDINE HCL 4 MG PO TABS: 4.0000 mg | ORAL_TABLET | Freq: Three times a day (TID) | ORAL | 1 refills | Status: DC | PRN

## 2019-10-05 MED ORDER — TRANEXAMIC ACID-NACL 1000-0.7 MG/100ML-% IV SOLN
1000.0000 mg | INTRAVENOUS | Status: AC
  Administered 2019-10-05: 1000 mg via INTRAVENOUS
  Filled 2019-10-05: qty 100

## 2019-10-05 MED ORDER — LACTATED RINGERS IV SOLN: INTRAVENOUS | Status: DC

## 2019-10-05 MED ORDER — ROCURONIUM BROMIDE 10 MG/ML (PF) SYRINGE
PREFILLED_SYRINGE | INTRAVENOUS | Status: DC | PRN
  Administered 2019-10-05: 30 mg via INTRAVENOUS
  Administered 2019-10-05: 70 mg via INTRAVENOUS

## 2019-10-05 MED ORDER — OXYCODONE-ACETAMINOPHEN 5-325 MG PO TABS: ORAL_TABLET | ORAL | 0 refills | Status: DC

## 2019-10-05 MED ORDER — LIDOCAINE 2% (20 MG/ML) 5 ML SYRINGE
INTRAMUSCULAR | Status: DC | PRN
  Administered 2019-10-05: 100 mg via INTRAVENOUS

## 2019-10-05 MED ORDER — WATER FOR IRRIGATION, STERILE IR SOLN
Status: DC | PRN
  Administered 2019-10-05: 2000 mL

## 2019-10-05 MED ORDER — LIDOCAINE 2% (20 MG/ML) 5 ML SYRINGE
INTRAMUSCULAR | Status: AC
  Filled 2019-10-05: qty 5

## 2019-10-05 MED ORDER — ONDANSETRON HCL 4 MG/2ML IJ SOLN
INTRAMUSCULAR | Status: AC
  Filled 2019-10-05: qty 2

## 2019-10-05 MED ORDER — EPHEDRINE SULFATE-NACL 50-0.9 MG/10ML-% IV SOSY
PREFILLED_SYRINGE | INTRAVENOUS | Status: DC | PRN
  Administered 2019-10-05: 5 mg via INTRAVENOUS
  Administered 2019-10-05: 10 mg via INTRAVENOUS

## 2019-10-05 MED ORDER — PHENYLEPHRINE 40 MCG/ML (10ML) SYRINGE FOR IV PUSH (FOR BLOOD PRESSURE SUPPORT)
PREFILLED_SYRINGE | INTRAVENOUS | Status: DC | PRN
  Administered 2019-10-05 (×2): 40 ug via INTRAVENOUS
  Administered 2019-10-05: 80 ug via INTRAVENOUS
  Administered 2019-10-05: 40 ug via INTRAVENOUS
  Administered 2019-10-05: 80 ug via INTRAVENOUS
  Administered 2019-10-05: 120 ug via INTRAVENOUS
  Administered 2019-10-05 (×2): 40 ug via INTRAVENOUS
  Administered 2019-10-05: 120 ug via INTRAVENOUS

## 2019-10-05 MED ORDER — ORAL CARE MOUTH RINSE: 15.0000 mL | Freq: Once | OROMUCOSAL | Status: AC

## 2019-10-05 MED ORDER — MIDAZOLAM HCL 2 MG/2ML IJ SOLN
INTRAMUSCULAR | Status: AC
  Filled 2019-10-05: qty 2

## 2019-10-05 MED ORDER — BUPIVACAINE LIPOSOME 1.3 % IJ SUSP
INTRAMUSCULAR | Status: DC | PRN
  Administered 2019-10-05: 10 mL via PERINEURAL

## 2019-10-05 MED ORDER — CHLORHEXIDINE GLUCONATE 0.12 % MT SOLN
15.0000 mL | Freq: Once | OROMUCOSAL | Status: AC
  Administered 2019-10-05: 15 mL via OROMUCOSAL

## 2019-10-05 MED ORDER — FENTANYL CITRATE (PF) 250 MCG/5ML IJ SOLN
INTRAMUSCULAR | Status: DC | PRN
Start: 2019-10-05 — End: 2019-10-05
  Administered 2019-10-05 (×3): 50 ug via INTRAVENOUS

## 2019-10-05 MED ORDER — ACETAMINOPHEN 10 MG/ML IV SOLN: 1000.0000 mg | Freq: Once | INTRAVENOUS | Status: DC | PRN

## 2019-10-05 MED ORDER — FENTANYL CITRATE (PF) 100 MCG/2ML IJ SOLN
INTRAMUSCULAR | Status: AC
  Filled 2019-10-05: qty 2

## 2019-10-05 MED ORDER — LACTATED RINGERS IV BOLUS
500.0000 mL | Freq: Once | INTRAVENOUS | Status: AC
  Administered 2019-10-05: 500 mL via INTRAVENOUS

## 2019-10-05 MED ORDER — SODIUM CHLORIDE 0.9 % IR SOLN
Status: DC | PRN
  Administered 2019-10-05: 1000 mL

## 2019-10-05 MED ORDER — DEXAMETHASONE SODIUM PHOSPHATE 10 MG/ML IJ SOLN
INTRAMUSCULAR | Status: DC | PRN
  Administered 2019-10-05: 8 mg via INTRAVENOUS

## 2019-10-05 MED ORDER — PHENYLEPHRINE 40 MCG/ML (10ML) SYRINGE FOR IV PUSH (FOR BLOOD PRESSURE SUPPORT)
PREFILLED_SYRINGE | INTRAVENOUS | Status: AC
  Filled 2019-10-05: qty 10

## 2019-10-05 MED ORDER — PHENYLEPHRINE HCL-NACL 10-0.9 MG/250ML-% IV SOLN
INTRAVENOUS | Status: AC
  Filled 2019-10-05: qty 250

## 2019-10-05 MED ORDER — DEXAMETHASONE SODIUM PHOSPHATE 10 MG/ML IJ SOLN
INTRAMUSCULAR | Status: AC
  Filled 2019-10-05: qty 1

## 2019-10-05 MED ORDER — PHENYLEPHRINE HCL-NACL 10-0.9 MG/250ML-% IV SOLN
INTRAVENOUS | Status: DC | PRN
  Administered 2019-10-05: 20 ug/min via INTRAVENOUS

## 2019-10-05 MED ORDER — ONDANSETRON HCL 4 MG/2ML IJ SOLN
INTRAMUSCULAR | Status: DC | PRN
  Administered 2019-10-05: 4 mg via INTRAVENOUS

## 2019-10-05 MED ORDER — PROPOFOL 10 MG/ML IV BOLUS
INTRAVENOUS | Status: DC | PRN
  Administered 2019-10-05: 180 mg via INTRAVENOUS
  Administered 2019-10-05: 20 mg via INTRAVENOUS

## 2019-10-05 SURGICAL SUPPLY — 66 items
BAG ZIPLOCK 12X15 (MISCELLANEOUS) ×3 IMPLANT
BIT DRILL 1.6MX128 (BIT) ×2 IMPLANT
BIT DRILL 1.6MX128MM (BIT) ×1
BLADE SAW SAG 73X25 THK (BLADE) ×2
BLADE SAW SGTL 73X25 THK (BLADE) ×1 IMPLANT
CEMENT BONE DEPUY (Cement) ×3 IMPLANT
CLOSURE WOUND 1/2 X4 (GAUZE/BANDAGES/DRESSINGS) ×1
COOLER ICEMAN CLASSIC (MISCELLANEOUS) IMPLANT
COVER BACK TABLE 60X90IN (DRAPES) ×3 IMPLANT
COVER SURGICAL LIGHT HANDLE (MISCELLANEOUS) ×3 IMPLANT
COVER WAND RF STERILE (DRAPES) IMPLANT
DRAPE INCISE IOBAN 66X45 STRL (DRAPES) ×3 IMPLANT
DRAPE ORTHO SPLIT 77X108 STRL (DRAPES) ×4
DRAPE POUCH INSTRU U-SHP 10X18 (DRAPES) ×3 IMPLANT
DRAPE SURG 17X11 SM STRL (DRAPES) ×3 IMPLANT
DRAPE SURG ORHT 6 SPLT 77X108 (DRAPES) ×2 IMPLANT
DRAPE TOP 10253 STERILE (DRAPES) ×3 IMPLANT
DRAPE U-SHAPE 47X51 STRL (DRAPES) ×3 IMPLANT
DRSG AQUACEL AG ADV 3.5X 6 (GAUZE/BANDAGES/DRESSINGS) ×3 IMPLANT
DURAPREP 26ML APPLICATOR (WOUND CARE) ×6 IMPLANT
ELECT BLADE TIP CTD 4 INCH (ELECTRODE) ×3 IMPLANT
ELECT REM PT RETURN 15FT ADLT (MISCELLANEOUS) ×3 IMPLANT
GLENOID CORTILOC AEQUALIS L40 (Shoulder) ×3 IMPLANT
GLOVE BIO SURGEON STRL SZ7 (GLOVE) ×3 IMPLANT
GLOVE BIO SURGEON STRL SZ7.5 (GLOVE) ×3 IMPLANT
GLOVE BIOGEL PI IND STRL 7.0 (GLOVE) ×1 IMPLANT
GLOVE BIOGEL PI IND STRL 8 (GLOVE) ×1 IMPLANT
GLOVE BIOGEL PI INDICATOR 7.0 (GLOVE) ×2
GLOVE BIOGEL PI INDICATOR 8 (GLOVE) ×2
GOWN STRL REUS W/TWL LRG LVL3 (GOWN DISPOSABLE) ×3 IMPLANT
GOWN STRL REUS W/TWL XL LVL3 (GOWN DISPOSABLE) ×3 IMPLANT
GUIDEWIRE GLENOID 2.5X220 (WIRE) ×3 IMPLANT
HANDPIECE INTERPULSE COAX TIP (DISPOSABLE) ×2
HEAD HUM AEQ HO 50X19 (Head) ×3 IMPLANT
HEMOSTAT SURGICEL 2X14 (HEMOSTASIS) ×3 IMPLANT
HOOD PEEL AWAY FLYTE STAYCOOL (MISCELLANEOUS) ×9 IMPLANT
KIT BASIN OR (CUSTOM PROCEDURE TRAY) ×3 IMPLANT
KIT TURNOVER KIT A (KITS) IMPLANT
MANIFOLD NEPTUNE II (INSTRUMENTS) ×3 IMPLANT
NEEDLE TROCAR POINT SZ 2 1/2 (NEEDLE) ×3 IMPLANT
NS IRRIG 1000ML POUR BTL (IV SOLUTION) ×3 IMPLANT
PACK SHOULDER (CUSTOM PROCEDURE TRAY) ×3 IMPLANT
PAD COLD SHLDR WRAP-ON (PAD) IMPLANT
PROTECTOR NERVE ULNAR (MISCELLANEOUS) IMPLANT
RESTRAINT HEAD UNIVERSAL NS (MISCELLANEOUS) ×3 IMPLANT
RETRIEVER SUT HEWSON (MISCELLANEOUS) ×3 IMPLANT
SET HNDPC FAN SPRY TIP SCT (DISPOSABLE) ×1 IMPLANT
SLING ARM IMMOBILIZER LRG (SOFTGOODS) IMPLANT
SMARTMIX MINI TOWER (MISCELLANEOUS) ×3
SPONGE LAP 18X18 RF (DISPOSABLE) ×3 IMPLANT
STEM HUMERAL PTC STND SZ2A (Joint) ×3 IMPLANT
STEM HUMERAL PTCSTD SZ2A (Joint) ×1 IMPLANT
STRIP CLOSURE SKIN 1/2X4 (GAUZE/BANDAGES/DRESSINGS) ×2 IMPLANT
SUCTION FRAZIER HANDLE 12FR (TUBING) ×2
SUCTION TUBE FRAZIER 12FR DISP (TUBING) ×1 IMPLANT
SUPPORT WRAP ARM LG (MISCELLANEOUS) ×3 IMPLANT
SUT ETHIBOND 2 V 37 (SUTURE) ×6 IMPLANT
SUT MNCRL AB 4-0 PS2 18 (SUTURE) ×3 IMPLANT
SUT VIC AB 2-0 CT1 27 (SUTURE) ×2
SUT VIC AB 2-0 CT1 TAPERPNT 27 (SUTURE) ×1 IMPLANT
TAPE LABRALWHITE 1.5X36 (TAPE) ×3 IMPLANT
TAPE SUT LABRALTAP WHT/BLK (SUTURE) ×3 IMPLANT
TOWEL OR 17X26 10 PK STRL BLUE (TOWEL DISPOSABLE) ×3 IMPLANT
TOWER SMARTMIX MINI (MISCELLANEOUS) ×1 IMPLANT
WATER STERILE IRR 1000ML POUR (IV SOLUTION) ×3 IMPLANT
YANKAUER SUCT BULB TIP 10FT TU (MISCELLANEOUS) ×3 IMPLANT

## 2019-10-05 NOTE — Progress Notes (Deleted)
Occupational Therapy Evaluation  s/p shoulder replacement without functional use of left non-dominant upper extremity secondary to effects of surgery and interscalene block and shoulder precautions. Therapist provided education and instruction to patient  in regards to exercises, precautions, positioning, donning upper extremity clothing and bathing while maintaining shoulder precautions, ice and edema management and donning/doffing sling. Patient verbalized understanding and demonstrated as needed. Patient needed assistance to donn shirt, underwear, pants, socks and shoes and provided with instruction on compensatory strategies to perform ADLs. Patient to follow up with MD for further therapy needs.   Delbert Phenix OT OT pager: [phone]

## 2019-10-05 NOTE — Progress Notes (Signed)
Occupational Therapy Evaluation  s/p shoulder replacement without functional use of left non-dominant upper extremity secondary to effects of surgery and interscalene block and shoulder precautions. Therapist provided education and instruction to patient in regards to exercises, precautions, positioning, donning upper extremity clothing and bathing while maintaining shoulder precautions, ice and edema management and donning/doffing sling. Patient verbalized understanding and demonstrated as needed. Patient needed assistance to donn shirt, underwear, pants, socks and shoes and provided with instruction on compensatory strategies to perform ADLs. Patient to follow up with MD for further therapy needs.     10/05/19 1100  OT Visit Information  Last OT Received On 10/05/19  Assistance Needed +1  History of Present Illness Patient is a 69 year old male admitted for L TSA. PMH includes R shoulder replacement  Precautions  Precautions Shoulder  Type of Shoulder Precautions ok AROM elbow wrist and hand, ok PROM FF 90 ER 0   Shoulder Interventions Shoulder sling/immobilizer;Off for dressing/bathing/exercises  Precaution Booklet Issued Yes (comment)  Required Braces or Orthoses Sling  Restrictions  Weight Bearing Restrictions Yes  LUE Weight Bearing NWB  Home Living  Family/patient expects to be discharged to: Private residence  Living Arrangements Spouse/significant other;Other (Comment) (great grandchildren)  Available Help at Discharge Family  Type of [place] to enter  Entrance Stairs-Number of Steps 4  Home Layout One level  Bathroom Shower/Tub Walk-in Engineer, materials None  Prior Function  Level of Independence Independent  Communication  Communication No difficulties  Pain Assessment  Pain Assessment Faces  Faces Pain Scale 2  Pain Location L shoulder  Pain Descriptors / Indicators Sore  Pain Intervention(s) Monitored during  session  Cognition  Arousal/Alertness Awake/alert  Behavior During Therapy WFL for tasks assessed/performed  Overall Cognitive Status Within Functional Limits for tasks assessed  Upper Extremity Assessment  Upper Extremity Assessment LUE deficits/detail  LUE Deficits / Details  AROM intact for digits, wrist  Lower Extremity Assessment  Lower Extremity Assessment Overall WFL for tasks assessed  Cervical / Trunk Assessment  Cervical / Trunk Assessment Normal  ADL  Overall ADL's  Needs assistance/impaired  Eating/Feeding Independent  Grooming Wash/dry hands;Independent  Upper Body Bathing Minimal assistance;Sitting  Lower Body Bathing Supervison/ safety;Sit to/from stand  Upper Body Dressing  Minimal assistance;Cueing for UE precautions;Cueing for compensatory techniques;Cueing for sequencing  Upper Body Dressing Details (indicate cue type and reason) min cues for adhering to shoulder precautions  Lower Body Dressing Supervision/safety;Sit to/from stand  Lower Body Dressing Details (indicate cue type and reason) min cues not to reach back to pull up underwear + pants  Toilet Transfer Independent;Ambulation;Regular Psychiatric nurse  Functional mobility during ADLs Independent  General ADL Comments instructed patient on shoulder precautions and how to maintain during ADLs  Bed Mobility  General bed mobility comments in chair  Transfers  Overall transfer level Independent  Balance  Overall balance assessment Mild deficits observed, not formally tested  Exercises  Exercises Shoulder  Shoulder Instructions  Donning/doffing shirt without moving shoulder Minimal assistance  Method for sponge bathing under operated UE Patient able to independently direct caregiver  Donning/doffing sling/immobilizer Patient able to independently direct caregiver  Correct positioning of sling/immobilizer Independent  Pendulum exercises (written home exercise  program)  (N/A)  ROM for elbow, wrist and digits of operated UE Independent  Sling wearing schedule (on at all times/off for ADL's) Independent  Proper positioning of operated UE when showering Patient  able to independently direct caregiver  Positioning of UE while sleeping Patient able to independently direct caregiver  OT - End of Session  Equipment Utilized During Treatment Other (comment) (sling)  Activity Tolerance Patient tolerated treatment well  Patient left in chair;with call bell/phone within reach  Nurse Communication Mobility status  OT Assessment  OT Recommendation/Assessment Progress rehab of shoulder as ordered by MD at follow-up appointment  OT Visit Diagnosis Pain  Pain - Right/Left Left  Pain - part of body Shoulder  OT Problem List Impaired UE functional use;Pain;Decreased knowledge of precautions  AM-PAC OT "6 Clicks" Daily Activity Outcome Measure (Version 2)  Help from another person eating meals? 4  Help from another person taking care of personal grooming? 4  Help from another person toileting, which includes using toliet, bedpan, or urinal? 4  Help from another person bathing (including washing, rinsing, drying)? 3  Help from another person to put on and taking off regular upper body clothing? 3  Help from another person to put on and taking off regular lower body clothing? 3  6 Click Score 21  OT Recommendation  Follow Up Recommendations Follow surgeons recommendation for DC plan and follow-up therapies  OT Equipment None recommended by OT  OT Time Calculation  OT Start Time (ACUTE ONLY) 1118  OT Stop Time (ACUTE ONLY) 1141  OT Time Calculation (min) 23 min  OT General Charges  $OT Visit 1 Visit  OT Evaluation  $OT Eval Low Complexity 1 Low  OT Treatments  $Self Care/Home Management  8-22 mins  Written Expression  Dominant Hand Right   Delbert Phenix OT OT pager: [phone]

## 2019-10-05 NOTE — Transfer of Care (Signed)
Immediate Anesthesia Transfer of Care Note  Patient: CLEVESTER HELZER  Procedure(s) Performed: TOTAL SHOULDER ARTHROPLASTY (Left Shoulder)  Patient Location: PACU  Anesthesia Type:General  Level of Consciousness: awake, alert  and patient cooperative  Airway & Oxygen Therapy: Patient Spontanous Breathing and Patient connected to face mask oxygen  Post-op Assessment: Report given to RN and Post -op Vital signs reviewed and stable  Post vital signs: Reviewed and stable  Last Vitals:  Vitals Value Taken Time  BP    Temp    Pulse 109 10/05/19 0927  Resp    SpO2 97 % 10/05/19 0927  Vitals shown include unvalidated device data.  Last Pain:  Vitals:   10/05/19 0616  TempSrc:   PainSc: 3       Patients Stated Pain Goal: 3 (45/40/98 1191)  Complications: No complications documented.

## 2019-10-05 NOTE — Anesthesia Procedure Notes (Signed)
Anesthesia Procedure Image    

## 2019-10-05 NOTE — Anesthesia Procedure Notes (Signed)
Anesthesia Regional Block: Interscalene brachial plexus block   Pre-Anesthetic Checklist: ,, timeout performed, Correct Patient, Correct Site, Correct Laterality, Correct Procedure, Correct Position, site marked, Risks and benefits discussed,  Surgical consent,  Pre-op evaluation,  At surgeon's request and post-op pain management  Laterality: Left  Prep: chloraprep       Needles:  Injection technique: Single-shot  Needle Type: Echogenic Needle     Needle Length: 9cm      Additional Needles:   Procedures:,,,, ultrasound used (permanent image in chart),,,,  Narrative:  Start time: 10/05/2019 7:12 AM End time: 10/05/2019 7:18 AM Injection made incrementally with aspirations every 5 mL.  Performed by: Personally  Anesthesiologist: Myrtie Soman, MD  Additional Notes: Patient tolerated the procedure well without complications

## 2019-10-05 NOTE — Addendum Note (Signed)
Addendum  created 10/05/19 1104 by Eben Burow, CRNA   Charge Capture section accepted

## 2019-10-05 NOTE — Anesthesia Postprocedure Evaluation (Signed)
Anesthesia Post Note  Patient: Travis Howell  Procedure(s) Performed: TOTAL SHOULDER ARTHROPLASTY (Left Shoulder)     Patient location during evaluation: PACU Anesthesia Type: General Level of consciousness: awake and alert Pain management: pain level controlled Vital Signs Assessment: post-procedure vital signs reviewed and stable Respiratory status: spontaneous breathing, nonlabored ventilation, respiratory function stable and patient connected to nasal cannula oxygen Cardiovascular status: blood pressure returned to baseline and stable Postop Assessment: no apparent nausea or vomiting Anesthetic complications: no   No complications documented.  Last Vitals:  Vitals:   10/05/19 1000 10/05/19 1015  BP: 128/78 135/76  Pulse: 93 93  Resp: 15 11  Temp:    SpO2: 98% 96%    Last Pain:  Vitals:   10/05/19 1030  TempSrc:   PainSc: 3                  Erline Siddoway S

## 2019-10-05 NOTE — Op Note (Signed)
Procedure(s): TOTAL SHOULDER ARTHROPLASTY Procedure Note  Travis Howell male 69 y.o. 10/05/2019   Preoperative diagnosis: Left shoulder end-stage osteoarthritis  Postoperative diagnosis: Same  Procedure(s) and Anesthesia Type:   LEFT  TOTAL SHOULDER ARTHROPLASTY - General  Surgeon(s) and Role:    [name], MD - Primary   Indications:  69 y.o. male  With endstage left shoulder arthritis. Pain and dysfunction interfered with quality of life and nonoperative treatment with activity modification, NSAIDS and injections failed.     Surgeon: Isabella Stalling   Assistants: Jeanmarie Hubert PA-C [facility] was present and scrubbed throughout the procedure and was essential in positioning, retraction, exposure, and closure)  Anesthesia: General endotracheal anesthesia with preoperative interscalene block given by the attending anesthesiologist    Procedure Detail  TOTAL SHOULDER ARTHROPLASTY  Findings: Tornier flex anatomic press-fit size 2 stem with a 50 x 19 head, cemented size large 40 Cortiloc glenoid.  A 10 degree corrective anterior reaming was used.   A lesser tuberosity osteotomy was performed and repaired at the conclusion of the procedure.  Estimated Blood Loss:  200 mL         Drains: None   Blood Given: none          Specimens: none        Complications:  * No complications entered in OR log *         Disposition: PACU - hemodynamically stable.         Condition: stable    Procedure:   The patient was identified in the preoperative holding area where I personally marked the operative extremity after verifying with the patient and consent. He  was taken to the operating room where He was transferred to the   operative table.  The patient received an interscalene block in   the holding area by the attending anesthesiologist.  General anesthesia was induced   in the operating room without complication.  The patient did receive IV  Ancef prior to  the commencement of the procedure.  The patient was   placed in the beach-chair position with the back raised about 30   degrees.  The nonoperative extremity and head and neck were carefully   positioned and padded protecting against neurovascular compromise.  The   left upper extremity was then prepped and draped in the standard sterile   fashion.    The appropriate operative time-out was performed with   Anesthesia, the perioperative staff, as well as myself and we all agreed   that the left side was the correct operative site.  An approximately   10 cm incision was made from the tip of the coracoid to the center point of the   humerus at the level of the axilla.  Dissection was carried down sharply   through subcutaneous tissues and cephalic vein was identified and taken   laterally with the deltoid.  The pectoralis major was taken medially.  The   upper 1 cm of the pectoralis major was released from its attachment on   the humerus.  The clavipectoral fascia was incised just lateral to the   conjoined tendon.  This incision was carried up to but not into the   coracoacromial ligament.  Digital palpation was used to prove   integrity of the axillary nerve which was protected throughout the   procedure.  Musculocutaneous nerve was not palpated in the operative   field.  Conjoined tendon was then retracted gently medially and the  deltoid laterally.  Anterior circumflex humeral vessels were clamped and   coagulated.  The soft tissues overlying the biceps was incised and this   incision was carried across the transverse humeral ligament to the base   of the coracoid.  The biceps was noted to be severely degenerated. It was released from the superior labrum. The biceps was then tenodesed to the soft tissue just above   pectoralis major and the remaining portion of the biceps superiorly was   excised.  An osteotomy was performed at the lesser tuberosity.  The capsule was then   released all  the way down to the 6 o'clock position of the humeral head.   The humeral head was then delivered with simultaneous adduction,   extension and external rotation.  All humeral osteophytes were removed   and the anatomic neck of the humerus was marked and cut free hand at   approximately 25 degrees retroversion within about 3 mm of the cuff   reflection posteriorly.  The head size was estimated to be a 50 medium   offset.  At that point, the humeral head was retracted posteriorly with   a Fukuda retractor.   Remaining portion of the capsule was released at the base of the   coracoid.  The remaining biceps anchor and the entire anterior-inferior   labrum was excised.  The posterior labrum was also excised but the   posterior capsule was not released.  The guidepin was placed bicortically with +10 degree elevated guide.  The reamer was used to ream to concentric bone with punctate bleeding.  This gave an excellent concentric surface.  The center hole was then drilled for an anchor peg glenoid followed by the three peripheral holes and none of the holes   exited the glenoid wall.  I then pulse irrigated these holes and dried   them with Surgicel.  The three peripheral holes were then   pressurized cemented and the anchor peg glenoid was placed and impacted   with an excellent fit.  The glenoid was a large 40 component.  The proximal humerus was then again exposed taking care not to displace the glenoid.    The entry awl was used followed by sounding reamers and then sequentially broached from size 1-2. This was then left in place and the calcar planer was used. Trial head was placed with a 50 x 19 high offset.  With the trial implantation of the component,  there was approximately 50% posterior translation with immediate snap back to the   anatomic position.  With forward elevation, there was no tendency   towards posterior subluxation.   The trial was removed and the final implant was prepared on a  back table.  The trial was removed and the final implant was prepared on a back table.   3 small holes were drilled on the medial side of the lesser tuberosity osteotomy, through which 2 labral tapes were passed. The implant was then placed through the loop of the 2 labral tapes and impacted with an excellent press-fit. This achieved excellent anatomic reconstruction of the proximal humerus.  The joint was then copiously irrigated with pulse lavage.  The subscapularis and   lesser tuberosity osteotomy were then repaired using the 2 labral tapes previously passed in a double row fashion with horizontal mattress sutures medially brought over through bone tunnels tied over a bone bridge laterally.   One #1 Ethibond was placed at the rotator interval just above  the lesser tuberosity. Copious irrigation was used. Skin was closed with 2-0 Vicryl sutures in the deep dermal layer and 4-0 Monocryl in a subcuticular  running fashion.  Sterile dressings were then applied including Aquacel.  The patient was placed in a sling and allowed to awaken from general anesthesia and taken to the recovery room in stable condition.      POSTOPERATIVE PLAN: He will be observed in the recovery room.  If he has a good regional nerve block and is hemodynamically stable he can be discharged home today with his wife.  He will follow up in 2 weeks.

## 2019-10-05 NOTE — Anesthesia Procedure Notes (Addendum)
Procedure Name: Intubation Date/Time: 10/05/2019 7:33 AM Performed by: Eben Burow, CRNA Pre-anesthesia Checklist: Patient identified, Emergency Drugs available, Suction available and Patient being monitored Patient Re-evaluated:Patient Re-evaluated prior to induction Oxygen Delivery Method: Circle system utilized Preoxygenation: Pre-oxygenation with 100% oxygen Induction Type: IV induction Ventilation: Mask ventilation without difficulty Laryngoscope Size: Mac and 4 Grade View: Grade I Tube type: Oral Tube size: 7.5 mm Number of attempts: 1 Airway Equipment and Method: Stylet and Oral airway Placement Confirmation: ETT inserted through vocal cords under direct vision,  positive ETCO2 and breath sounds checked- equal and bilateral Secured at: 23 cm Tube secured with: Tape Dental Injury: Teeth and Oropharynx as per pre-operative assessment  Comments: Intubation by Aline Brochure, SRNA.

## 2019-10-05 NOTE — Discharge Instructions (Signed)
Dental Antibiotics:  In most cases prophylactic antibiotics for Dental procdeures after total joint surgery are not necessary.  Exceptions are as follows:  1. History of prior total joint infection  2. Severely immunocompromised (Organ Transplant, cancer chemotherapy, Rheumatoid biologic meds such as Humera)  3. Poorly controlled diabetes (A1C &gt; 8.0, blood glucose over 200)  If you have one of these conditions, contact your surgeon for an antibiotic prescription, prior to your dental procedure.  Discharge Instructions after Total Shoulder Arthroplasty   . A sling has been provided for you. Remove the sling 5 times each day to perform motion exercises.  . Use ice on the shoulder intermittently over the first 48 hours after surgery.  . Pain medication has been prescribed for you.  . Use your medication liberally over the first 48 hours, and then begin to taper your use. You may take Extra Strength Tylenol or Tylenol only in place of the pain pills. DO NOT take ANY nonsteroidal anti-inflammatory pain medications: Advil, Motrin, Ibuprofen, Aleve, Naproxen, or Naprosyn. . Take one aspirin a day for 2 weeks after surgery, unless you have an aspirin sensitivity/allergy or asthma. . Leave your dressing on until your first follow up visit.  You may shower with the dressing.  Hold your arm as if you still have your sling on while you shower. . Active reaching and lifting are not permitted. You may use the operative arm for activities of daily living that do not require the operative arm to leave the side of the body, such as eating, drinking, bathing, etc.  . Three to 5 times each day you should perform assisted overhead reaching and external rotation (outward turning) exercises with the operative arm. You were taught these exercises prior to discharge. Both exercises should be done with the non-operative arm used as the "therapist arm" while the operative arm remains relaxed. Ten of each exercise  should be done three to five times each day.   Overhead reach is helping to lift your stiff arm up as high as it will go. To stretch your overhead reach, lie flat on your back, relax, and grasp the wrist of the tight shoulder with your opposite hand. Using the power in your opposite arm, bring the stiff arm up as far as it is comfortable. Start holding it for ten seconds and then work up to where you can hold it for a count of 30. Breathe slowly and deeply while the arm is moved. Repeat this stretch ten times, trying to help the ar up a little higher each time.     External rotation is turning the arm out to the side while your elbow stays close to your body. External rotation is best stretched while you are lying on your back. Hold a cane, yardstick, broom handle, or dowel in both hands. Bend both elbows to a right angle. Use steady, gentle force from your normal arm to rotate the hand of the stiff shoulder out away from your body. Continue the rotation until it is straight in front of you holding it there for a count of 10. Do not go beyond this level of rotation until seen back by Dr. Chandler. Repeat this exercise ten times slowly.      Please call [phone] during normal business hours or [phone] after hours for any problems. Including the following:  - excessive redness of the incisions - drainage for more than 4 days - fever of more than 101.5 F  *Please note that pain medications   will not be refilled after hours or on weekends.    
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
## 2019-10-06 ENCOUNTER — Encounter (HOSPITAL_COMMUNITY): Payer: Self-pay | Admitting: Orthopedic Surgery

## 2019-10-18 DIAGNOSIS — Z96612 Presence of left artificial shoulder joint: Secondary | ICD-10-CM | POA: Diagnosis not present

## 2019-10-18 DIAGNOSIS — Z9889 Other specified postprocedural states: Secondary | ICD-10-CM | POA: Diagnosis not present

## 2019-10-18 DIAGNOSIS — Z471 Aftercare following joint replacement surgery: Secondary | ICD-10-CM | POA: Diagnosis not present

## 2019-10-23 DIAGNOSIS — Z23 Encounter for immunization: Secondary | ICD-10-CM | POA: Diagnosis not present

## 2019-10-24 ENCOUNTER — Encounter: Payer: Self-pay | Admitting: Family Medicine

## 2019-10-24 NOTE — Telephone Encounter (Signed)
Updated pt's chart.  

## 2019-11-09 ENCOUNTER — Other Ambulatory Visit: Payer: Self-pay | Admitting: Family Medicine

## 2019-11-09 NOTE — Telephone Encounter (Signed)
agree

## 2019-11-09 NOTE — Telephone Encounter (Signed)
E-scribed refills.  Plz schedule wellness, cpe and lab visits.  

## 2019-11-10 ENCOUNTER — Telehealth: Payer: Self-pay | Admitting: Family Medicine

## 2019-11-10 NOTE — Telephone Encounter (Signed)
Left vm for patient to call back and schedule cpe, mwv, and labs 11/10/19 EM called twice

## 2019-11-14 ENCOUNTER — Other Ambulatory Visit: Payer: Self-pay | Admitting: Family Medicine

## 2019-11-15 DIAGNOSIS — Z471 Aftercare following joint replacement surgery: Secondary | ICD-10-CM | POA: Diagnosis not present

## 2019-11-15 DIAGNOSIS — Z96612 Presence of left artificial shoulder joint: Secondary | ICD-10-CM | POA: Diagnosis not present

## 2019-11-23 DIAGNOSIS — M19012 Primary osteoarthritis, left shoulder: Secondary | ICD-10-CM | POA: Diagnosis not present

## 2019-11-23 DIAGNOSIS — Z96612 Presence of left artificial shoulder joint: Secondary | ICD-10-CM | POA: Diagnosis not present

## 2019-11-24 ENCOUNTER — Other Ambulatory Visit: Payer: Self-pay | Admitting: Family Medicine

## 2019-11-24 DIAGNOSIS — M19012 Primary osteoarthritis, left shoulder: Secondary | ICD-10-CM | POA: Diagnosis not present

## 2019-11-24 DIAGNOSIS — Z96612 Presence of left artificial shoulder joint: Secondary | ICD-10-CM | POA: Diagnosis not present

## 2019-12-05 DIAGNOSIS — M19012 Primary osteoarthritis, left shoulder: Secondary | ICD-10-CM | POA: Diagnosis not present

## 2019-12-05 DIAGNOSIS — Z96612 Presence of left artificial shoulder joint: Secondary | ICD-10-CM | POA: Diagnosis not present

## 2019-12-07 DIAGNOSIS — M19012 Primary osteoarthritis, left shoulder: Secondary | ICD-10-CM | POA: Diagnosis not present

## 2019-12-07 DIAGNOSIS — Z96612 Presence of left artificial shoulder joint: Secondary | ICD-10-CM | POA: Diagnosis not present

## 2019-12-08 ENCOUNTER — Other Ambulatory Visit: Payer: Self-pay | Admitting: Family Medicine

## 2019-12-12 DIAGNOSIS — Z96612 Presence of left artificial shoulder joint: Secondary | ICD-10-CM | POA: Diagnosis not present

## 2019-12-12 DIAGNOSIS — M19012 Primary osteoarthritis, left shoulder: Secondary | ICD-10-CM | POA: Diagnosis not present

## 2019-12-12 NOTE — Telephone Encounter (Signed)
Pharmacy requests refill on: Albuterol HFA   LAST REFILL: 10/19/2018 LAST OV: 06/06/2019 NEXT OV: 01/29/2020 PHARMACY: [place], [place]

## 2019-12-14 DIAGNOSIS — Z96612 Presence of left artificial shoulder joint: Secondary | ICD-10-CM | POA: Diagnosis not present

## 2019-12-14 DIAGNOSIS — M19012 Primary osteoarthritis, left shoulder: Secondary | ICD-10-CM | POA: Diagnosis not present

## 2019-12-19 DIAGNOSIS — Z96612 Presence of left artificial shoulder joint: Secondary | ICD-10-CM | POA: Diagnosis not present

## 2019-12-19 DIAGNOSIS — M19012 Primary osteoarthritis, left shoulder: Secondary | ICD-10-CM | POA: Diagnosis not present

## 2019-12-20 ENCOUNTER — Telehealth: Payer: Self-pay

## 2019-12-20 MED ORDER — METFORMIN HCL 500 MG PO TABS: ORAL_TABLET | ORAL | 0 refills | Status: DC

## 2019-12-20 NOTE — Telephone Encounter (Signed)
Received faxed refill request from [facility].  E-scribed refill.

## 2019-12-21 DIAGNOSIS — M19012 Primary osteoarthritis, left shoulder: Secondary | ICD-10-CM | POA: Diagnosis not present

## 2019-12-21 DIAGNOSIS — Z96612 Presence of left artificial shoulder joint: Secondary | ICD-10-CM | POA: Diagnosis not present

## 2020-01-21 ENCOUNTER — Other Ambulatory Visit: Payer: Self-pay | Admitting: Family Medicine

## 2020-01-21 DIAGNOSIS — E118 Type 2 diabetes mellitus with unspecified complications: Secondary | ICD-10-CM

## 2020-01-21 DIAGNOSIS — E1169 Type 2 diabetes mellitus with other specified complication: Secondary | ICD-10-CM

## 2020-01-21 DIAGNOSIS — D649 Anemia, unspecified: Secondary | ICD-10-CM

## 2020-01-21 DIAGNOSIS — N400 Enlarged prostate without lower urinary tract symptoms: Secondary | ICD-10-CM

## 2020-01-21 DIAGNOSIS — G2581 Restless legs syndrome: Secondary | ICD-10-CM

## 2020-01-21 DIAGNOSIS — E785 Hyperlipidemia, unspecified: Secondary | ICD-10-CM

## 2020-01-23 ENCOUNTER — Ambulatory Visit (INDEPENDENT_AMBULATORY_CARE_PROVIDER_SITE_OTHER): Payer: Medicare Other

## 2020-01-23 ENCOUNTER — Other Ambulatory Visit: Payer: Self-pay

## 2020-01-23 ENCOUNTER — Other Ambulatory Visit (INDEPENDENT_AMBULATORY_CARE_PROVIDER_SITE_OTHER): Payer: Medicare Other

## 2020-01-23 ENCOUNTER — Other Ambulatory Visit: Payer: Medicare Other

## 2020-01-23 DIAGNOSIS — G2581 Restless legs syndrome: Secondary | ICD-10-CM | POA: Diagnosis not present

## 2020-01-23 DIAGNOSIS — D649 Anemia, unspecified: Secondary | ICD-10-CM | POA: Diagnosis not present

## 2020-01-23 DIAGNOSIS — E1169 Type 2 diabetes mellitus with other specified complication: Secondary | ICD-10-CM | POA: Diagnosis not present

## 2020-01-23 DIAGNOSIS — N400 Enlarged prostate without lower urinary tract symptoms: Secondary | ICD-10-CM | POA: Diagnosis not present

## 2020-01-23 DIAGNOSIS — E785 Hyperlipidemia, unspecified: Secondary | ICD-10-CM | POA: Diagnosis not present

## 2020-01-23 DIAGNOSIS — E118 Type 2 diabetes mellitus with unspecified complications: Secondary | ICD-10-CM

## 2020-01-23 DIAGNOSIS — Z0000 Encounter for general adult medical examination without abnormal findings: Secondary | ICD-10-CM

## 2020-01-23 LAB — COMPREHENSIVE METABOLIC PANEL
ALT: 15 U/L (ref 0–53)
AST: 21 U/L (ref 0–37)
Albumin: 4.7 g/dL (ref 3.5–5.2)
Alkaline Phosphatase: 56 U/L (ref 39–117)
BUN: 15 mg/dL (ref 6–23)
CO2: 32 mEq/L (ref 19–32)
Calcium: 9.5 mg/dL (ref 8.4–10.5)
Chloride: 101 mEq/L (ref 96–112)
Creatinine, Ser: 0.88 mg/dL (ref 0.40–1.50)
GFR: 88.47 mL/min (ref >60.00)
Glucose, Bld: 117 mg/dL — ABNORMAL HIGH (ref 70–99)
Potassium: 4.2 mEq/L (ref 3.5–5.1)
Sodium: 140 mEq/L (ref 135–145)
Total Bilirubin: 0.7 mg/dL (ref 0.2–1.2)
Total Protein: 7.1 g/dL (ref 6.0–8.3)

## 2020-01-23 LAB — LIPID PANEL
Cholesterol: 128 mg/dL (ref 0–200)
HDL: 45 mg/dL (ref >39.00)
LDL Cholesterol: 64 mg/dL (ref 0–99)
NonHDL: 82.59
Total CHOL/HDL Ratio: 3
Triglycerides: 93 mg/dL (ref 0.0–149.0)
VLDL: 18.6 mg/dL (ref 0.0–40.0)

## 2020-01-23 LAB — PSA: PSA: 0.24 ng/mL (ref 0.10–4.00)

## 2020-01-23 LAB — FERRITIN: Ferritin: 84.7 ng/mL (ref 22.0–322.0)

## 2020-01-23 LAB — HEMOGLOBIN A1C: Hgb A1c MFr Bld: 6.9 % — ABNORMAL HIGH (ref 4.6–6.5)

## 2020-01-23 NOTE — Progress Notes (Signed)
PCP notes:  Health Maintenance: No gaps noted   Abnormal Screenings: none   Patient concerns: none   Nurse concerns: none   Next PCP appt.: 01/29/2020 @ 10:30 am
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
## 2020-01-23 NOTE — Patient Instructions (Signed)
Mr. Travis Howell , Thank you for taking time to come for your Medicare Wellness Visit. I appreciate your ongoing commitment to your health goals. Please review the following plan we discussed and let me know if I can assist you in the future.   Screening recommendations/referrals: Colonoscopy: Up to date, completed 11/24/2017, due 11/2022 Recommended yearly ophthalmology/optometry visit for glaucoma screening and checkup Recommended yearly dental visit for hygiene and checkup  Vaccinations: Influenza vaccine: Up to date, completed 10/23/2019, due 08/2020 Pneumococcal vaccine: Completed series Tdap vaccine: Up to date, completed 10/08/2016, due 10/2026 Shingles vaccine: due, check with your insurance regarding coverage if interested    Covid-19: Completed series  Advanced directives: copy in chart  Conditions/risks identified: Diabetes, Hyperlipidemia  Next appointment: Follow up in one year for your annual wellness visit.   Preventive Care 13 Years and Older, Male Preventive care refers to lifestyle choices and visits with your health care provider that can promote health and wellness. What does preventive care include?  A yearly physical exam. This is also called an annual well check.  Dental exams once or twice a year.  Routine eye exams. Ask your health care provider how often you should have your eyes checked.  Personal lifestyle choices, including:  Daily care of your teeth and gums.  Regular physical activity.  Eating a healthy diet.  Avoiding tobacco and drug use.  Limiting alcohol use.  Practicing safe sex.  Taking low doses of aspirin every day.  Taking vitamin and mineral supplements as recommended by your health care provider. What happens during an annual well check? The services and screenings done by your health care provider during your annual well check will depend on your age, overall health, lifestyle risk factors, and family history of disease. Counseling   Your health care provider may ask you questions about your:  Alcohol use.  Tobacco use.  Drug use.  Emotional well-being.  Home and relationship well-being.  Sexual activity.  Eating habits.  History of falls.  Memory and ability to understand (cognition).  Work and work Statistician. Screening  You may have the following tests or measurements:  Height, weight, and BMI.  Blood pressure.  Lipid and cholesterol levels. These may be checked every 5 years, or more frequently if you are over 67 years old.  Skin check.  Lung cancer screening. You may have this screening every year starting at age 20 if you have a 30-pack-year history of smoking and currently smoke or have quit within the past 15 years.  Fecal occult blood test (FOBT) of the stool. You may have this test every year starting at age 62.  Flexible sigmoidoscopy or colonoscopy. You may have a sigmoidoscopy every 5 years or a colonoscopy every 10 years starting at age 7.  Prostate cancer screening. Recommendations will vary depending on your family history and other risks.  Hepatitis C blood test.  Hepatitis B blood test.  Sexually transmitted disease (STD) testing.  Diabetes screening. This is done by checking your blood sugar (glucose) after you have not eaten for a while (fasting). You may have this done every 1-3 years.  Abdominal aortic aneurysm (AAA) screening. You may need this if you are a current or former smoker.  Osteoporosis. You may be screened starting at age 66 if you are at high risk. Talk with your health care provider about your test results, treatment options, and if necessary, the need for more tests. Vaccines  Your health care provider may recommend certain vaccines, such as:  Influenza vaccine. This is recommended every year.  Tetanus, diphtheria, and acellular pertussis (Tdap, Td) vaccine. You may need a Td booster every 10 years.  Zoster vaccine. You may need this after age  7.  Pneumococcal 13-valent conjugate (PCV13) vaccine. One dose is recommended after age 54.  Pneumococcal polysaccharide (PPSV23) vaccine. One dose is recommended after age 41. Talk to your health care provider about which screenings and vaccines you need and how often you need them. This information is not intended to replace advice given to you by your health care provider. Make sure you discuss any questions you have with your health care provider. Document Released: 01/18/2015 Document Revised: 09/11/2015 Document Reviewed: 10/23/2014 Elsevier Interactive Patient Education  2017 [place] Prevention in the Home Falls can cause injuries. They can happen to people of all ages. There are many things you can do to make your home safe and to help prevent falls. What can I do on the outside of my home?  Regularly fix the edges of walkways and driveways and fix any cracks.  Remove anything that might make you trip as you walk through a door, such as a raised step or threshold.  Trim any bushes or trees on the path to your home.  Use bright outdoor lighting.  Clear any walking paths of anything that might make someone trip, such as rocks or tools.  Regularly check to see if handrails are loose or broken. Make sure that both sides of any steps have handrails.  Any raised decks and porches should have guardrails on the edges.  Have any leaves, snow, or ice cleared regularly.  Use sand or salt on walking paths during winter.  Clean up any spills in your garage right away. This includes oil or grease spills. What can I do in the bathroom?  Use night lights.  Install grab bars by the toilet and in the tub and shower. Do not use towel bars as grab bars.  Use non-skid mats or decals in the tub or shower.  If you need to sit down in the shower, use a plastic, non-slip stool.  Keep the floor dry. Clean up any water that spills on the floor as soon as it happens.  Remove  soap buildup in the tub or shower regularly.  Attach bath mats securely with double-sided non-slip rug tape.  Do not have throw rugs and other things on the floor that can make you trip. What can I do in the bedroom?  Use night lights.  Make sure that you have a light by your bed that is easy to reach.  Do not use any sheets or blankets that are too big for your bed. They should not hang down onto the floor.  Have a firm chair that has side arms. You can use this for support while you get dressed.  Do not have throw rugs and other things on the floor that can make you trip. What can I do in the kitchen?  Clean up any spills right away.  Avoid walking on wet floors.  Keep items that you use a lot in easy-to-reach places.  If you need to reach something above you, use a strong step stool that has a grab bar.  Keep electrical cords out of the way.  Do not use floor polish or wax that makes floors slippery. If you must use wax, use non-skid floor wax.  Do not have throw rugs and other things on the floor that  can make you trip. What can I do with my stairs?  Do not leave any items on the stairs.  Make sure that there are handrails on both sides of the stairs and use them. Fix handrails that are broken or loose. Make sure that handrails are as long as the stairways.  Check any carpeting to make sure that it is firmly attached to the stairs. Fix any carpet that is loose or worn.  Avoid having throw rugs at the top or bottom of the stairs. If you do have throw rugs, attach them to the floor with carpet tape.  Make sure that you have a light switch at the top of the stairs and the bottom of the stairs. If you do not have them, ask someone to add them for you. What else can I do to help prevent falls?  Wear shoes that:  Do not have high heels.  Have rubber bottoms.  Are comfortable and fit you well.  Are closed at the toe. Do not wear sandals.  If you use a  stepladder:  Make sure that it is fully opened. Do not climb a closed stepladder.  Make sure that both sides of the stepladder are locked into place.  Ask someone to hold it for you, if possible.  Clearly mark and make sure that you can see:  Any grab bars or handrails.  First and last steps.  Where the edge of each step is.  Use tools that help you move around (mobility aids) if they are needed. These include:  Canes.  Walkers.  Scooters.  Crutches.  Turn on the lights when you go into a dark area. Replace any light bulbs as soon as they burn out.  Set up your furniture so you have a clear path. Avoid moving your furniture around.  If any of your floors are uneven, fix them.  If there are any pets around you, be aware of where they are.  Review your medicines with your doctor. Some medicines can make you feel dizzy. This can increase your chance of falling. Ask your doctor what other things that you can do to help prevent falls. This information is not intended to replace advice given to you by your health care provider. Make sure you discuss any questions you have with your health care provider. Document Released: 10/18/2008 Document Revised: 05/30/2015 Document Reviewed: 01/26/2014 Elsevier Interactive Patient Education  2017 [employer].

## 2020-01-29 ENCOUNTER — Encounter: Payer: Self-pay | Admitting: Family Medicine

## 2020-01-29 ENCOUNTER — Other Ambulatory Visit: Payer: Self-pay

## 2020-01-29 ENCOUNTER — Ambulatory Visit (INDEPENDENT_AMBULATORY_CARE_PROVIDER_SITE_OTHER): Payer: Medicare Other | Admitting: Family Medicine

## 2020-01-29 VITALS — BP 122/76 | HR 86 | Temp 97.8°F | Ht 66.5 in | Wt 225.3 lb

## 2020-01-29 DIAGNOSIS — J986 Disorders of diaphragm: Secondary | ICD-10-CM

## 2020-01-29 DIAGNOSIS — N400 Enlarged prostate without lower urinary tract symptoms: Secondary | ICD-10-CM | POA: Diagnosis not present

## 2020-01-29 DIAGNOSIS — I2510 Atherosclerotic heart disease of native coronary artery without angina pectoris: Secondary | ICD-10-CM

## 2020-01-29 DIAGNOSIS — E118 Type 2 diabetes mellitus with unspecified complications: Secondary | ICD-10-CM

## 2020-01-29 DIAGNOSIS — F418 Other specified anxiety disorders: Secondary | ICD-10-CM

## 2020-01-29 DIAGNOSIS — E785 Hyperlipidemia, unspecified: Secondary | ICD-10-CM

## 2020-01-29 DIAGNOSIS — Z7189 Other specified counseling: Secondary | ICD-10-CM | POA: Diagnosis not present

## 2020-01-29 DIAGNOSIS — E1169 Type 2 diabetes mellitus with other specified complication: Secondary | ICD-10-CM

## 2020-01-29 DIAGNOSIS — I10 Essential (primary) hypertension: Secondary | ICD-10-CM

## 2020-01-29 MED ORDER — FLUOXETINE HCL 10 MG PO CAPS
10.0000 mg | ORAL_CAPSULE | Freq: Every day | ORAL | 3 refills | Status: DC
Start: 2020-01-29 — End: 2020-05-21

## 2020-01-29 MED ORDER — ROSUVASTATIN CALCIUM 5 MG PO TABS
5.0000 mg | ORAL_TABLET | Freq: Every day | ORAL | 3 refills | Status: DC
Start: 2020-01-29 — End: 2021-01-29

## 2020-01-29 MED ORDER — EZETIMIBE 10 MG PO TABS
10.0000 mg | ORAL_TABLET | Freq: Every day | ORAL | 3 refills | Status: DC
Start: 2020-01-29 — End: 2021-01-29

## 2020-01-29 MED ORDER — METFORMIN HCL 500 MG PO TABS
ORAL_TABLET | ORAL | 3 refills | Status: DC
Start: 2020-01-29 — End: 2020-02-20

## 2020-01-29 MED ORDER — AMLODIPINE BESYLATE-VALSARTAN 5-160 MG PO TABS
0.5000 | ORAL_TABLET | Freq: Every day | ORAL | 3 refills | Status: DC
Start: 2020-01-29 — End: 2020-09-28

## 2020-01-29 MED ORDER — SILODOSIN 8 MG PO CAPS
8.0000 mg | ORAL_CAPSULE | Freq: Every day | ORAL | 3 refills | Status: DC
Start: 2020-01-29 — End: 2021-01-29

## 2020-01-29 NOTE — Assessment & Plan Note (Addendum)
Advanced directives received, reviewed 10/2018. HCPOA are Merleen Nicely then Charleston Poot (deceased sister). Ok with life support if able to recover within 3-6 months.

## 2020-01-29 NOTE — Assessment & Plan Note (Signed)
Chronic stable continue current regimen.

## 2020-01-29 NOTE — Patient Instructions (Addendum)
If interested, check with pharmacy about new 2 shot shingles series (shingrix).  Work on diet and incorporating regular exercise (ie walking) into the routine for goal weight loss and better sugar control.  You are doing well today Return as needed or in 6 months for diabetes follow up visit.   Health Maintenance After Age 69 After age 25, you are at a higher risk for certain long-term diseases and infections as well as injuries from falls. Falls are a major cause of broken bones and head injuries in people who are older than age 20. Getting regular preventive care can help to keep you healthy and well. Preventive care includes getting regular testing and making lifestyle changes as recommended by your health care provider. Talk with your health care provider about:  Which screenings and tests you should have. A screening is a test that checks for a disease when you have no symptoms.  A diet and exercise plan that is right for you. What should I know about screenings and tests to prevent falls? Screening and testing are the best ways to find a health problem early. Early diagnosis and treatment give you the best chance of managing medical conditions that are common after age 71. Certain conditions and lifestyle choices may make you more likely to have a fall. Your health care provider may recommend:  Regular vision checks. Poor vision and conditions such as cataracts can make you more likely to have a fall. If you wear glasses, make sure to get your prescription updated if your vision changes.  Medicine review. Work with your health care provider to regularly review all of the medicines you are taking, including over-the-counter medicines. Ask your health care provider about any side effects that may make you more likely to have a fall. Tell your health care provider if any medicines that you take make you feel dizzy or sleepy.  Osteoporosis screening. Osteoporosis is a condition that causes the  bones to get weaker. This can make the bones weak and cause them to break more easily.  Blood pressure screening. Blood pressure changes and medicines to control blood pressure can make you feel dizzy.  Strength and balance checks. Your health care provider may recommend certain tests to check your strength and balance while standing, walking, or changing positions.  Foot health exam. Foot pain and numbness, as well as not wearing proper footwear, can make you more likely to have a fall.  Depression screening. You may be more likely to have a fall if you have a fear of falling, feel emotionally low, or feel unable to do activities that you used to do.  Alcohol use screening. Using too much alcohol can affect your balance and may make you more likely to have a fall. What actions can I take to lower my risk of falls? General instructions  Talk with your health care provider about your risks for falling. Tell your health care provider if: ? You fall. Be sure to tell your health care provider about all falls, even ones that seem minor. ? You feel dizzy, sleepy, or off-balance.  Take over-the-counter and prescription medicines only as told by your health care provider. These include any supplements.  Eat a healthy diet and maintain a healthy weight. A healthy diet includes low-fat dairy products, low-fat (lean) meats, and fiber from whole grains, beans, and lots of fruits and vegetables. Home safety  Remove any tripping hazards, such as rugs, cords, and clutter.  Install safety equipment such as  grab bars in bathrooms and safety rails on stairs.  Keep rooms and walkways well-lit. Activity  Follow a regular exercise program to stay fit. This will help you maintain your balance. Ask your health care provider what types of exercise are appropriate for you.  If you need a cane or walker, use it as recommended by your health care provider.  Wear supportive shoes that have nonskid soles.    Lifestyle  Do not drink alcohol if your health care provider tells you not to drink.  If you drink alcohol, limit how much you have: ? 0-1 drink a day for women. ? 0-2 drinks a day for men.  Be aware of how much alcohol is in your drink. In the [place] one drink equals one typical bottle of beer (12 oz), one-half glass of wine (5 oz), or one shot of hard liquor (1 oz).  Do not use any products that contain nicotine or tobacco, such as cigarettes and e-cigarettes. If you need help quitting, ask your health care provider. Summary  Having a healthy lifestyle and getting preventive care can help to protect your health and wellness after age 28.  Screening and testing are the best way to find a health problem early and help you avoid having a fall. Early diagnosis and treatment give you the best chance for managing medical conditions that are more common for people who are older than age 70.  Falls are a major cause of broken bones and head injuries in people who are older than age 78. Take precautions to prevent a fall at home.  Work with your health care provider to learn what changes you can make to improve your health and wellness and to prevent falls. This information is not intended to replace advice given to you by your health care provider. Make sure you discuss any questions you have with your health care provider. Document Revised: 04/14/2018 Document Reviewed: 11/04/2016 Elsevier Patient Education  2021 [employer].

## 2020-01-29 NOTE — Assessment & Plan Note (Signed)
Stable period on rapaflo. Continue.

## 2020-01-29 NOTE — Progress Notes (Signed)
Patient ID: Travis Howell, male    DOB: December 04, 1950, 70 y.o.   MRN: [id number]  This visit was conducted in person.  BP 122/76 (BP Location: Left Arm, Patient Position: Sitting, Cuff Size: Large)   Pulse 86   Temp 97.8 F (36.6 C) (Temporal)   Ht 5' 6.5" (1.689 m)   Wt 225 lb 5 oz (102.2 kg)   SpO2 96%   BMI 35.82 kg/m    CC: AMW f/u visit  Subjective:   HPI: Travis Howell is a 70 y.o. male presenting on 01/29/2020 for Annual Exam (Prt 2. )   Saw health advisor last week for medicare wellness visit. Note reviewed.   No exam data present  Flowsheet Row Clinical Support from 01/23/2020 in [place] at [place]  PHQ-2 Total Score 0      Fall Risk  01/23/2020 10/19/2018 11/24/2017 10/11/2017 10/09/2016  Falls in the past year? 0 0 0 No Yes  Comment - - Emmi Telephone Survey: data to providers prior to load - -  Number falls in past yr: 0 - - - 1  Injury with Fall? 0 - - - Yes  Comment - - - - fell off ladder, mechanical fall  Risk for fall due to : No Fall Risks Medication side effect - - -  Follow up Falls evaluation completed;Falls prevention discussed Falls evaluation completed;Falls prevention discussed - - -    DM - on metformin 500mg  daily.  Doing grocery shopping online due to pandemic. Has 2 great grandchildren that stay with them (5yo and 8yo).  Siblings both passed away 08-May-2019 stroke and heart attack.   Preventative: COLONOSCOPY 11/2017 - 1 TA, diverticulosis, rpt 5 yrs ([facility]) Prostate cancer screening - on rapaflo for BPH. voids well. Nocturia x1. No urinary symptoms Lung cancer screening - quit smoking 05/07/2009, 1 pack per week<20 PY hx.  Flu shotyearly COVID vaccine Pfizer 02/2019 x2, 10/2019 Prevnar 07-May-2017, pneumovax 10/2018 Tdap May 07, 2016 zostavax - May 07, 2012 shingrix - discussed.will check with pharmacy.  Advanced directives received, reviewed 10/2018. HCPOA are Merleen Nicely then Charleston Poot (deceased sister). Ok with life support if able to  recover within 3-6 months.  Seat belt use discussed.  Sunscreen use discussed. No changing moles on skin.  Ex smoker - quit 2009-05-07. Wife smokes indoors.  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 Interim medical history since our last visit reviewed. Allergies and medications reviewed and updated. Outpatient Medications Prior to Visit  Medication Sig Dispense Refill  . albuterol (VENTOLIN HFA) 108 (90 Base) MCG/ACT inhaler USE 2 INHALATIONS BY MOUTH  EVERY 6 HOURS AS NEEDED FOR WHEEZING OR SHORTNESS OF  BREATH 72 g 0  . aspirin 81 MG chewable tablet Chew 81 mg by mouth daily.     . Omega-3 Fatty Acids (FISH OIL) 1000 MG CAPS Take 2,000 mg by mouth in the morning and at bedtime.     . Potassium 99 MG TABS Take 1-2 tablets (99-198 mg total) by mouth daily. (Patient taking differently: Take 99 mg by mouth daily.)    . amLODipine-valsartan (EXFORGE) 5-160 MG tablet Take 0.5 tablets by mouth daily. 45 tablet 3  .  ezetimibe (ZETIA) 10 MG tablet TAKE 1 TABLET BY MOUTH  DAILY 90 tablet 0  . FLUoxetine (PROZAC) 10 MG capsule TAKE 1 CAPSULE BY MOUTH  DAILY 90 capsule 0  . metFORMIN (GLUCOPHAGE) 500 MG tablet TAKE 1 TABLET BY MOUTH EVERY DAY WITH BREAKFAST 90 tablet 0  . rosuvastatin (CRESTOR) 5 MG tablet TAKE 1 TABLET BY MOUTH  DAILY 90 tablet 0  . silodosin (RAPAFLO) 8 MG CAPS capsule TAKE 1 CAPSULE BY MOUTH  DAILY WITH BREAKFAST 90 capsule 0  . oxyCODONE-acetaminophen (PERCOCET) 5-325 MG tablet Take 1-2 tablets every 4 hours as needed for post operative pain. MAX 6/day 30 tablet 0  . sulfamethoxazole-trimethoprim (BACTRIM DS) 800-160 MG tablet Take 1 tablet  by mouth 2 (two) times daily. 6 week course (Patient not taking: Reported on 09/18/2019) 90 tablet 0  . tiZANidine (ZANAFLEX) 4 MG tablet Take 1 tablet (4 mg total) by mouth every 8 (eight) hours as needed for muscle spasms. 30 tablet 1   No facility-administered medications prior to visit.     Per HPI unless specifically indicated in ROS section below Review of Systems Objective:  BP 122/76 (BP Location: Left Arm, Patient Position: Sitting, Cuff Size: Large)   Pulse 86   Temp 97.8 F (36.6 C) (Temporal)   Ht 5' 6.5" (1.689 m)   Wt 225 lb 5 oz (102.2 kg)   SpO2 96%   BMI 35.82 kg/m   Wt Readings from Last 3 Encounters:  01/29/20 225 lb 5 oz (102.2 kg)  09/27/19 224 lb 9.6 oz (101.9 kg)  06/06/19 225 lb 4 oz (102.2 kg)      Physical Exam Vitals and nursing note reviewed.  Constitutional:      General: He is not in acute distress.    Appearance: Normal appearance. He is well-developed and well-nourished. He is not ill-appearing.  HENT:     Head: Normocephalic and atraumatic.     Right Ear: Hearing, tympanic membrane, ear canal and external ear normal.     Left Ear: Hearing, tympanic membrane, ear canal and external ear normal.     Mouth/Throat:     Mouth: Mucous membranes are normal.     Pharynx: No posterior oropharyngeal edema.  Eyes:     General: No scleral icterus.    Extraocular Movements: Extraocular movements intact and EOM normal.     Conjunctiva/sclera: Conjunctivae normal.     Pupils: Pupils are equal, round, and reactive to light.  Neck:     Thyroid: No thyroid mass or thyromegaly.     Vascular: No carotid bruit.  Cardiovascular:     Rate and Rhythm: Normal rate and regular rhythm.     Pulses: Normal pulses and intact distal pulses.          Radial pulses are 2+ on the right side and 2+ on the left side.     Heart sounds: Normal heart sounds. No murmur heard.   Pulmonary:     Effort: Pulmonary effort is normal. No respiratory distress.     Breath sounds:  Normal breath sounds. No wheezing, rhonchi or rales.     Comments: Chronic decreased breath sounds RLL Abdominal:     General: Abdomen is flat. Bowel sounds are normal. There is no distension.     Palpations: Abdomen is soft. There is no mass.     Tenderness: There is no abdominal tenderness. There is no guarding or rebound.     Hernia: No hernia is present.  Musculoskeletal:  General: No edema. Normal range of motion.     Cervical back: Normal range of motion and neck supple.     Right lower leg: No edema.     Left lower leg: No edema.  Lymphadenopathy:     Cervical: No cervical adenopathy.  Skin:    General: Skin is warm and dry.     Findings: No rash.  Neurological:     General: No focal deficit present.     Mental Status: He is alert and oriented to person, place, and time.     Comments: CN grossly intact, station and gait intact  Psychiatric:        Mood and Affect: Mood and affect and mood normal.        Behavior: Behavior normal.        Thought Content: Thought content normal.        Judgment: Judgment normal.       Results for orders placed or performed in visit on 01/23/20  Ferritin  Result Value Ref Range   Ferritin 84.7 22.0 - 322.0 ng/mL  PSA  Result Value Ref Range   PSA 0.24 0.10 - 4.00 ng/mL  Hemoglobin A1c  Result Value Ref Range   Hgb A1c MFr Bld 6.9 (H) 4.6 - 6.5 %  Comprehensive metabolic panel  Result Value Ref Range   Sodium 140 135 - 145 mEq/L   Potassium 4.2 3.5 - 5.1 mEq/L   Chloride 101 96 - 112 mEq/L   CO2 32 19 - 32 mEq/L   Glucose, Bld 117 (H) 70 - 99 mg/dL   BUN 15 6 - 23 mg/dL   Creatinine, Ser 0.88 0.40 - 1.50 mg/dL   Total Bilirubin 0.7 0.2 - 1.2 mg/dL   Alkaline Phosphatase 56 39 - 117 U/L   AST 21 0 - 37 U/L   ALT 15 0 - 53 U/L   Total Protein 7.1 6.0 - 8.3 g/dL   Albumin 4.7 3.5 - 5.2 g/dL   GFR 88.47 >60.00 mL/min   Calcium 9.5 8.4 - 10.5 mg/dL  Lipid panel  Result Value Ref Range   Cholesterol 128 0 - 200 mg/dL    Triglycerides 93.0 0.0 - 149.0 mg/dL   HDL 45.00 >39.00 mg/dL   VLDL 18.6 0.0 - 40.0 mg/dL   LDL Cholesterol 64 0 - 99 mg/dL   Total CHOL/HDL Ratio 3    NonHDL 82.59    Assessment & Plan:  This visit occurred during the SARS-CoV-2 public health emergency.  Safety protocols were in place, including screening questions prior to the visit, additional usage of staff PPE, and extensive cleaning of exam room while observing appropriate contact time as indicated for disinfecting solutions.   Problem List Items Addressed This Visit    Severe obesity (BMI 35.0-39.9) with comorbidity ([place])    Encouraged healthy diet and lifestyle choices to affect sustainable weight loss.       Relevant Medications   metFORMIN (GLUCOPHAGE) 500 MG tablet   Hyperlipidemia associated with type 2 diabetes mellitus (HCC)    Chronic, stable. Continue zetia and low dose crestor. The ASCVD Risk score Mikey Bussing DC Jr., et al., 2013) failed to calculate for the following reasons:   The patient has a prior MI or stroke diagnosis       Relevant Medications   amLODipine-valsartan (EXFORGE) 5-160 MG tablet   ezetimibe (ZETIA) 10 MG tablet   metFORMIN (GLUCOPHAGE) 500 MG tablet   rosuvastatin (CRESTOR) 5 MG tablet   HTN (hypertension)  Chronic stable continue current regimen.       Relevant Medications   amLODipine-valsartan (EXFORGE) 5-160 MG tablet   ezetimibe (ZETIA) 10 MG tablet   rosuvastatin (CRESTOR) 5 MG tablet   Elevated hemidiaphragm    Chronic. Asxs.       Depression with anxiety    Stable period on low dose prozac - continue.       Relevant Medications   FLUoxetine (PROZAC) 10 MG capsule   Controlled diabetes mellitus type 2 with complications (HCC)    Chronic, deteriorated but still adequate control on low dose metformin. He will renew efforts for low sugar diet and goal weight loss and if remaining elevated next visit, consider increased metformin dose.       Relevant Medications    amLODipine-valsartan (EXFORGE) 5-160 MG tablet   metFORMIN (GLUCOPHAGE) 500 MG tablet   rosuvastatin (CRESTOR) 5 MG tablet   CAD (coronary artery disease)    Sees cards regularly appreciate their care. Continue statin, zetia, aspirin.      Relevant Medications   amLODipine-valsartan (EXFORGE) 5-160 MG tablet   ezetimibe (ZETIA) 10 MG tablet   rosuvastatin (CRESTOR) 5 MG tablet   BPH (benign prostatic hyperplasia)    Stable period on rapaflo. Continue.       Relevant Medications   silodosin (RAPAFLO) 8 MG CAPS capsule   Advanced care planning/counseling discussion - Primary    Advanced directives received, reviewed 10/2018. HCPOA are Merleen Nicely then Charleston Poot (deceased sister). Ok with life support if able to recover within 3-6 months.           Meds ordered this encounter  Medications  . amLODipine-valsartan (EXFORGE) 5-160 MG tablet    Sig: Take 0.5 tablets by mouth daily.    Dispense:  45 tablet    Refill:  3  . ezetimibe (ZETIA) 10 MG tablet    Sig: Take 1 tablet (10 mg total) by mouth daily.    Dispense:  90 tablet    Refill:  3  . metFORMIN (GLUCOPHAGE) 500 MG tablet    Sig: TAKE 1 TABLET BY MOUTH EVERY DAY WITH BREAKFAST    Dispense:  90 tablet    Refill:  3  . FLUoxetine (PROZAC) 10 MG capsule    Sig: Take 1 capsule (10 mg total) by mouth daily.    Dispense:  90 capsule    Refill:  3  . rosuvastatin (CRESTOR) 5 MG tablet    Sig: Take 1 tablet (5 mg total) by mouth daily.    Dispense:  90 tablet    Refill:  3  . silodosin (RAPAFLO) 8 MG CAPS capsule    Sig: Take 1 capsule (8 mg total) by mouth daily with breakfast.    Dispense:  90 capsule    Refill:  3   No orders of the defined types were placed in this encounter.   Patient instructions: If interested, check with pharmacy about new 2 shot shingles series (shingrix).  Work on diet and incorporating regular exercise (ie walking) into the routine for goal weight loss and better sugar control.   You are doing well today Return as needed or in 6 months for diabetes follow up visit.   Follow up plan: Return in about 6 months (around 07/28/2020) for follow up visit.  [name], MD

## 2020-01-29 NOTE — Assessment & Plan Note (Signed)
Chronic. Asxs.

## 2020-01-29 NOTE — Assessment & Plan Note (Signed)
Sees cards regularly appreciate their care. Continue statin, zetia, aspirin.

## 2020-01-29 NOTE — Assessment & Plan Note (Signed)
Chronic, deteriorated but still adequate control on low dose metformin. He will renew efforts for low sugar diet and goal weight loss and if remaining elevated next visit, consider increased metformin dose.

## 2020-01-29 NOTE — Assessment & Plan Note (Signed)
Encouraged healthy diet and lifestyle choices to affect sustainable weight loss.  ?

## 2020-01-29 NOTE — Assessment & Plan Note (Signed)
Chronic, stable. Continue zetia and low dose crestor. The ASCVD Risk score Mikey Bussing DC Jr., et al., 2013) failed to calculate for the following reasons:   The patient has a prior MI or stroke diagnosis

## 2020-01-29 NOTE — Assessment & Plan Note (Signed)
Stable period on low dose prozac - continue.

## 2020-02-12 ENCOUNTER — Other Ambulatory Visit: Payer: Self-pay | Admitting: Family Medicine

## 2020-02-12 NOTE — Telephone Encounter (Signed)
Pharmacy requests refill on: Albuterol 108 mcg/act inhaler   LAST REFILL: 12/12/2019  LAST OV: 01/29/2020 NEXT OV: 07/29/2020 PHARMACY: [facility]

## 2020-02-20 ENCOUNTER — Other Ambulatory Visit: Payer: Self-pay | Admitting: Family Medicine

## 2020-03-27 DIAGNOSIS — Z471 Aftercare following joint replacement surgery: Secondary | ICD-10-CM | POA: Diagnosis not present

## 2020-03-27 DIAGNOSIS — Z96612 Presence of left artificial shoulder joint: Secondary | ICD-10-CM | POA: Diagnosis not present

## 2020-04-15 ENCOUNTER — Ambulatory Visit: Payer: Medicare Other | Admitting: Family Medicine

## 2020-05-09 DIAGNOSIS — Z23 Encounter for immunization: Secondary | ICD-10-CM | POA: Diagnosis not present
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
## 2020-05-21 ENCOUNTER — Ambulatory Visit (INDEPENDENT_AMBULATORY_CARE_PROVIDER_SITE_OTHER): Payer: Medicare Other | Admitting: Interventional Cardiology

## 2020-05-21 ENCOUNTER — Encounter: Payer: Self-pay | Admitting: Interventional Cardiology

## 2020-05-21 ENCOUNTER — Other Ambulatory Visit: Payer: Self-pay

## 2020-05-21 VITALS — BP 130/80 | HR 90 | Ht 66.5 in | Wt 223.0 lb

## 2020-05-21 DIAGNOSIS — I252 Old myocardial infarction: Secondary | ICD-10-CM | POA: Diagnosis not present

## 2020-05-21 DIAGNOSIS — I119 Hypertensive heart disease without heart failure: Secondary | ICD-10-CM

## 2020-05-21 DIAGNOSIS — I2510 Atherosclerotic heart disease of native coronary artery without angina pectoris: Secondary | ICD-10-CM

## 2020-05-21 DIAGNOSIS — E782 Mixed hyperlipidemia: Secondary | ICD-10-CM

## 2020-05-21 NOTE — Patient Instructions (Signed)
Medication Instructions:  Your physician recommends that you continue on your current medications as directed. Please refer to the Current Medication list given to you today.  *If you need a refill on your cardiac medications before your next appointment, please call your pharmacy*   Lab Work: none If you have labs (blood work) drawn today and your tests are completely normal, you will receive your results only by: . MyChart Message (if you have MyChart) OR . A paper copy in the mail If you have any lab test that is abnormal or we need to change your treatment, we will call you to review the results.   Testing/Procedures: none   Follow-Up: At [facility], you and your health needs are our priority.  As part of our continuing mission to provide you with exceptional heart care, we have created designated Provider Care Teams.  These Care Teams include your primary Cardiologist (physician) and Advanced Practice Providers (APPs -  Physician Assistants and Nurse Practitioners) who all work together to provide you with the care you need, when you need it.  We recommend signing up for the patient portal called "MyChart".  Sign up information is provided on this After Visit Summary.  MyChart is used to connect with patients for Virtual Visits (Telemedicine).  Patients are able to view lab/test results, encounter notes, upcoming appointments, etc.  Non-urgent messages can be sent to your provider as well.   To learn more about what you can do with MyChart, go to [email or web address].    Your next appointment:   12 month(s)  The format for your next appointment:   In Person  Provider:   You may see Jayadeep Varanasi, MD or one of the following Advanced Practice Providers on your designated Care Team:    Dayna Dunn, PA-C  Michele Lenze, PA-C    Other Instructions  High-Fiber Eating Plan Fiber, also called dietary fiber, is a type of carbohydrate. It is found foods such as fruits,  vegetables, whole grains, and beans. A high-fiber diet can have many health benefits. Your health care provider may recommend a high-fiber diet to help:  Prevent constipation. Fiber can make your bowel movements more regular.  Lower your cholesterol.  Relieve the following conditions: ? Inflammation of veins in the anus (hemorrhoids). ? Inflammation of specific areas of the digestive tract (uncomplicated diverticulosis). ? A problem of the large intestine, also called the colon, that sometimes causes pain and diarrhea (irritable bowel syndrome, or IBS).  Prevent overeating as part of a weight-loss plan.  Prevent heart disease, type 2 diabetes, and certain cancers. What are tips for following this plan? Reading food labels  Check the nutrition facts label on food products for the amount of dietary fiber. Choose foods that have 5 grams of fiber or more per serving.  The goals for recommended daily fiber intake include: ? Men (age 50 or younger): 34-38 g. ? Men (over age 50): 28-34 g. ? Women (age 50 or younger): 25-28 g. ? Women (over age 50): 22-25 g. Your daily fiber goal is _____________ g.   Shopping  Choose whole fruits and vegetables instead of processed forms, such as apple juice or applesauce.  Choose a wide variety of high-fiber foods such as avocados, lentils, oats, and kidney beans.  Read the nutrition facts label of the foods you choose. Be aware of foods with added fiber. These foods often have high sugar and sodium amounts per serving. Cooking  Use whole-grain flour for baking and cooking.    Cook with brown rice instead of white rice. Meal planning  Start the day with a breakfast that is high in fiber, such as a cereal that contains 5 g of fiber or more per serving.  Eat breads and cereals that are made with whole-grain flour instead of refined flour or white flour.  Eat brown rice, bulgur wheat, or millet instead of white rice.  Use beans in place of meat in  soups, salads, and pasta dishes.  Be sure that half of the grains you eat each day are whole grains. General information  You can get the recommended daily intake of dietary fiber by: ? Eating a variety of fruits, vegetables, grains, nuts, and beans. ? Taking a fiber supplement if you are not able to take in enough fiber in your diet. It is better to get fiber through food than from a supplement.  Gradually increase how much fiber you consume. If you increase your intake of dietary fiber too quickly, you may have bloating, cramping, or gas.  Drink plenty of water to help you digest fiber.  Choose high-fiber snacks, such as berries, raw vegetables, nuts, and popcorn. What foods should I eat? Fruits Berries. Pears. Apples. Oranges. Avocado. Prunes and raisins. Dried figs. Vegetables Sweet potatoes. Spinach. Kale. Artichokes. Cabbage. Broccoli. Cauliflower. Green peas. Carrots. Squash. Grains Whole-grain breads. Multigrain cereal. Oats and oatmeal. Brown rice. Barley. Bulgur wheat. Millet. Quinoa. Bran muffins. Popcorn. Rye wafer crackers. Meats and other proteins Navy beans, kidney beans, and pinto beans. Soybeans. Split peas. Lentils. Nuts and seeds. Dairy Fiber-fortified yogurt. Beverages Fiber-fortified soy milk. Fiber-fortified orange juice. Other foods Fiber bars. The items listed above may not be a complete list of recommended foods and beverages. Contact a dietitian for more information. What foods should I avoid? Fruits Fruit juice. Cooked, strained fruit. Vegetables Fried potatoes. Canned vegetables. Well-cooked vegetables. Grains White bread. Pasta made with refined flour. White rice. Meats and other proteins Fatty cuts of meat. Fried chicken or fried fish. Dairy Milk. Yogurt. Cream cheese. Sour cream. Fats and oils Butters. Beverages Soft drinks. Other foods Cakes and pastries. The items listed above may not be a complete list of foods and beverages to avoid.  Talk with your dietitian about what choices are best for you. Summary  Fiber is a type of carbohydrate. It is found in foods such as fruits, vegetables, whole grains, and beans.  A high-fiber diet has many benefits. It can help to prevent constipation, lower blood cholesterol, aid weight loss, and reduce your risk of heart disease, diabetes, and certain cancers.  Increase your intake of fiber gradually. Increasing fiber too quickly may cause cramping, bloating, and gas. Drink plenty of water while you increase the amount of fiber you consume.  The best sources of fiber include whole fruits and vegetables, whole grains, nuts, seeds, and beans. This information is not intended to replace advice given to you by your health care provider. Make sure you discuss any questions you have with your health care provider. Document Revised: 04/27/2019 Document Reviewed: 04/27/2019 Elsevier Patient Education  2021 Elsevier Inc.   

## 2020-05-28 DIAGNOSIS — M1711 Unilateral primary osteoarthritis, right knee: Secondary | ICD-10-CM | POA: Diagnosis not present

## 2020-05-28 DIAGNOSIS — M1712 Unilateral primary osteoarthritis, left knee: Secondary | ICD-10-CM | POA: Diagnosis not present

## 2020-06-06 DIAGNOSIS — M1712 Unilateral primary osteoarthritis, left knee: Secondary | ICD-10-CM | POA: Diagnosis not present

## 2020-06-06 DIAGNOSIS — M1711 Unilateral primary osteoarthritis, right knee: Secondary | ICD-10-CM | POA: Diagnosis not present

## 2020-06-13 DIAGNOSIS — M1711 Unilateral primary osteoarthritis, right knee: Secondary | ICD-10-CM | POA: Diagnosis not present

## 2020-06-13 DIAGNOSIS — M1712 Unilateral primary osteoarthritis, left knee: Secondary | ICD-10-CM | POA: Diagnosis not present

## 2020-07-29 ENCOUNTER — Ambulatory Visit (INDEPENDENT_AMBULATORY_CARE_PROVIDER_SITE_OTHER): Payer: Medicare Other | Admitting: Family Medicine

## 2020-07-29 ENCOUNTER — Telehealth: Payer: Self-pay | Admitting: Interventional Cardiology

## 2020-07-29 ENCOUNTER — Encounter: Payer: Self-pay | Admitting: Family Medicine

## 2020-07-29 ENCOUNTER — Other Ambulatory Visit: Payer: Self-pay

## 2020-07-29 VITALS — BP 130/70 | HR 105 | Temp 97.4°F | Ht 66.5 in | Wt 216.6 lb

## 2020-07-29 DIAGNOSIS — M109 Gout, unspecified: Secondary | ICD-10-CM | POA: Diagnosis not present

## 2020-07-29 DIAGNOSIS — E118 Type 2 diabetes mellitus with unspecified complications: Secondary | ICD-10-CM

## 2020-07-29 DIAGNOSIS — I499 Cardiac arrhythmia, unspecified: Secondary | ICD-10-CM | POA: Diagnosis not present

## 2020-07-29 DIAGNOSIS — E669 Obesity, unspecified: Secondary | ICD-10-CM

## 2020-07-29 DIAGNOSIS — I2510 Atherosclerotic heart disease of native coronary artery without angina pectoris: Secondary | ICD-10-CM

## 2020-07-29 DIAGNOSIS — I4891 Unspecified atrial fibrillation: Secondary | ICD-10-CM | POA: Diagnosis not present

## 2020-07-29 DIAGNOSIS — M170 Bilateral primary osteoarthritis of knee: Secondary | ICD-10-CM | POA: Insufficient documentation

## 2020-07-29 DIAGNOSIS — I4819 Other persistent atrial fibrillation: Secondary | ICD-10-CM | POA: Insufficient documentation

## 2020-07-29 LAB — CBC WITH DIFFERENTIAL/PLATELET
Basophils Absolute: 0 10*3/uL (ref 0.0–0.1)
Basophils Relative: 0.7 % (ref 0.0–3.0)
Eosinophils Absolute: 0.3 10*3/uL (ref 0.0–0.7)
Eosinophils Relative: 4.5 % (ref 0.0–5.0)
HCT: 37.4 % — ABNORMAL LOW (ref 39.0–52.0)
Hemoglobin: 13.2 g/dL (ref 13.0–17.0)
Lymphocytes Relative: 16.2 % (ref 12.0–46.0)
Lymphs Abs: 1 10*3/uL (ref 0.7–4.0)
MCHC: 35.1 g/dL (ref 30.0–36.0)
MCV: 97.9 fl (ref 78.0–100.0)
Monocytes Absolute: 0.4 10*3/uL (ref 0.1–1.0)
Monocytes Relative: 6.6 % (ref 3.0–12.0)
Neutro Abs: 4.4 10*3/uL (ref 1.4–7.7)
Neutrophils Relative %: 72 % (ref 43.0–77.0)
Platelets: 177 10*3/uL (ref 150.0–400.0)
RBC: 3.82 Mil/uL — ABNORMAL LOW (ref 4.22–5.81)
RDW: 12.8 % (ref 11.5–15.5)
WBC: 6.1 10*3/uL (ref 4.0–10.5)

## 2020-07-29 LAB — SEDIMENTATION RATE: Sed Rate: 20 mm/hr (ref 0–20)

## 2020-07-29 LAB — BASIC METABOLIC PANEL
BUN: 11 mg/dL (ref 6–23)
CO2: 30 mEq/L (ref 19–32)
Calcium: 9.6 mg/dL (ref 8.4–10.5)
Chloride: 102 mEq/L (ref 96–112)
Creatinine, Ser: 0.82 mg/dL (ref 0.40–1.50)
GFR: 90.05 mL/min (ref >60.00)
Glucose, Bld: 128 mg/dL — ABNORMAL HIGH (ref 70–99)
Potassium: 4.2 mEq/L (ref 3.5–5.1)
Sodium: 142 mEq/L (ref 135–145)

## 2020-07-29 LAB — POCT GLYCOSYLATED HEMOGLOBIN (HGB A1C): Hemoglobin A1C: 6.3 % — AB (ref 4.0–5.6)

## 2020-07-29 LAB — TSH: TSH: 2.21 u[IU]/mL (ref 0.35–5.50)

## 2020-07-29 LAB — URIC ACID: Uric Acid, Serum: 6.6 mg/dL (ref 4.0–7.8)

## 2020-07-29 MED ORDER — APIXABAN 5 MG PO TABS: 5.0000 mg | ORAL_TABLET | Freq: Two times a day (BID) | ORAL | 6 refills | Status: DC

## 2020-07-29 MED ORDER — METOPROLOL SUCCINATE ER 25 MG PO TB24: 25.0000 mg | ORAL_TABLET | Freq: Every day | ORAL | 6 refills | Status: DC

## 2020-07-29 MED ORDER — COLCHICINE 0.6 MG PO TABS: 0.6000 mg | ORAL_TABLET | Freq: Every day | ORAL | 0 refills | Status: DC | PRN

## 2020-07-29 NOTE — Assessment & Plan Note (Signed)
Latest steroid injection was 1 month ago

## 2020-07-29 NOTE — Assessment & Plan Note (Addendum)
Update EKG today - afib - see below.

## 2020-07-29 NOTE — Telephone Encounter (Signed)
Patient called to schedule appt with Dr. Irish Lack. Dr. Irish Lack does not have any future appts available.  Per PCP:  Atrial fibrillation ([place])     New diagnosis. Unsure how long he's been in afib as pt asxs. Will start eliquis and toprol XL '25mg'$  daily. Will ask pt to call cardiology for f/u appt of new onset afib. Pt agrees with plan.

## 2020-07-29 NOTE — Telephone Encounter (Signed)
Left message to call office.  Patient can be scheduled to see Dr Irish Lack on July 29 at 2:40

## 2020-07-29 NOTE — Assessment & Plan Note (Addendum)
Chronic, improved on low dose metformin. Continues working on healthy low carb diet and weight loss. Congratulated.

## 2020-07-29 NOTE — Assessment & Plan Note (Signed)
Story/exam consistent with this. Check labwork today (ESR, CBC, urate) and start colchicine PRN. rec hold statin on first day of colchicine (1.53m dose).

## 2020-07-29 NOTE — Patient Instructions (Addendum)
EKG today - new atrial fibrillation. Start toprol XL '25mg'$  once daily to slow down heart as well as eliquis blood thinner '5mg'$  twice daily. Call Dr Hassell Done office to schedule sooner follow up appointment for new onset atrial fibrillation.  Let us know if any questions.  Schedule eye exam as you're due.  Let us know if interested in diabetes classes.  I am suspicious for gout flare - check labs today, start colchicine 2 pills daily on first day then 1 pill once daily until gout flare has improved. Hold crestor on first day of colchicine use.  Let us know if not improving with this.  Return as needed or in 6 months for physical/wellness visit   Atrial Fibrillation  Atrial fibrillation is a type of irregular or rapid heartbeat (arrhythmia). In atrial fibrillation, the top part of the heart (atria) beats in an irregular pattern. This makes the heart unable to pump bloodnormally and effectively. The goal of treatment is to prevent blood clots from forming, control your heart rate, or restore your heartbeat to a normal rhythm. If this condition is not treated, it can cause serious problems, such as a weakened heart muscle (cardiomyopathy) or a stroke. What are the causes? This condition is often caused by medical conditions that damage the heart's electrical system. These include: High blood pressure (hypertension). This is the most common cause. Certain heart problems or conditions, such as heart failure, coronary artery disease, heart valve problems, or heart surgery. Diabetes. Overactive thyroid (hyperthyroidism). Obesity. Chronic kidney disease. In some cases, the cause of this condition is not known. What increases the risk? This condition is more likely to develop in: Older people. People who smoke. Athletes who do endurance exercise. People who have a family history of atrial fibrillation. Men. People who use drugs. People who drink a lot of alcohol. People who have lung conditions,  such as emphysema, pneumonia, or COPD. People who have obstructive sleep apnea. What are the signs or symptoms? Symptoms of this condition include: A feeling that your heart is racing or beating irregularly. Discomfort or pain in your chest. Shortness of breath. Sudden light-headedness or weakness. Tiring easily during exercise or activity. Fatigue. Syncope (fainting). Sweating. In some cases, there are no symptoms. How is this diagnosed? Your health care provider may detect atrial fibrillation when taking your pulse. If detected, this condition may be diagnosed with: An electrocardiogram (ECG) to check electrical signals of the heart. An ambulatory cardiac monitor to record your heart's activity for a few days. A transthoracic echocardiogram (TTE) to create pictures of your heart. A transesophageal echocardiogram (TEE) to create even closer pictures of your heart. A stress test to check your blood supply while you exercise. Imaging tests, such as a CT scan or chest X-ray. Blood tests. How is this treated? Treatment depends on underlying conditions and how you feel when you experience atrial fibrillation. This condition may be treated with: Medicines to prevent blood clots or to treat heart rate or heart rhythm problems. Electrical cardioversion to reset the heart's rhythm. A pacemaker to correct abnormal heart rhythm. Ablation to remove the heart tissue that sends abnormal signals. Left atrial appendage closure to seal the area where blood clots can form. In some cases, underlying conditions will be treated. Follow these instructions at home: Medicines Take over-the counter and prescription medicines only as told by your health care provider. Do not take any new medicines without talking to your health care provider. If you are taking blood thinners: Talk with  your health care provider before you take any medicines that contain aspirin or NSAIDs, such as ibuprofen. These medicines  increase your risk for dangerous bleeding. Take your medicine exactly as told, at the same time every day. Avoid activities that could cause injury or bruising, and follow instructions about how to prevent falls. Wear a medical alert bracelet or carry a card that lists what medicines you take. Lifestyle     Do not use any products that contain nicotine or tobacco, such as cigarettes, e-cigarettes, and chewing tobacco. If you need help quitting, ask your health care provider. Eat heart-healthy foods. Talk with a dietitian to make an eating plan that is right for you. Exercise regularly as told by your health care provider. Do not drink alcohol. Lose weight if you are overweight. Do not use drugs, including cannabis. General instructions If you have obstructive sleep apnea, manage your condition as told by your health care provider. Do not use diet pills unless your health care provider approves. Diet pills can make heart problems worse. Keep all follow-up visits as told by your health care provider. This is important. Contact a health care provider if you: Notice a change in the rate, rhythm, or strength of your heartbeat. Are taking a blood thinner and you notice more bruising. Tire more easily when you exercise or do heavy work. Have a sudden change in weight. Get help right away if you have:  Chest pain, abdominal pain, sweating, or weakness. Trouble breathing. Side effects of blood thinners, such as blood in your vomit, stool, or urine, or bleeding that cannot stop. Any symptoms of a stroke. "BE FAST" is an easy way to remember the main warning signs of a stroke: B - Balance. Signs are dizziness, sudden trouble walking, or loss of balance. E - Eyes. Signs are trouble seeing or a sudden change in vision. F - Face. Signs are sudden weakness or numbness of the face, or the face or eyelid drooping on one side. A - Arms. Signs are weakness or numbness in an arm. This happens suddenly  and usually on one side of the body. S - Speech. Signs are sudden trouble speaking, slurred speech, or trouble understanding what people say. T - Time. Time to call emergency services. Write down what time symptoms started. Other signs of a stroke, such as: A sudden, severe headache with no known cause. Nausea or vomiting. Seizure. These symptoms may represent a serious problem that is an emergency. Do not wait to see if the symptoms will go away. Get medical help right away. Call your local emergency services (911 in the [place] Do not drive yourself to the hospital. Summary Atrial fibrillation is a type of irregular or rapid heartbeat (arrhythmia). Symptoms include a feeling that your heart is beating fast or irregularly. You may be given medicines to prevent blood clots or to treat heart rate or heart rhythm problems. Get help right away if you have signs or symptoms of a stroke. Get help right away if you cannot catch your breath or have chest pain or pressure. This information is not intended to replace advice given to you by your health care provider. Make sure you discuss any questions you have with your healthcare provider. Document Revised: 06/15/2018 Document Reviewed: 06/15/2018 Elsevier Patient Education  [place].

## 2020-07-29 NOTE — Progress Notes (Signed)
Patient ID: Travis Howell, male    DOB: 03/06/1950, 70 y.o.   MRN: [id number]  This visit was conducted in person.  BP 130/70   Pulse (!) 105   Temp (!) 97.4 F (36.3 C) (Temporal)   Ht 5' 6.5" (1.689 m)   Wt 216 lb 9 oz (98.2 kg)   SpO2 95%   BMI 34.43 kg/m    CC: 88moDM f/u visit  Subjective:   HPI: Travis STETTNERis a 70y.o. male presenting on 07/29/2020 for Diabetes (Here for 6 mo f/u. ) and Toe Pain (C/o right great toe pain/swelling.  Stepped on toy car about 4 wks ago. )   Down 9 lbs in 6 months.   Stepped on grandson's cars in the living room 1 month ago. This healed however now great toe pain has recurred. Points to R 1st MTPJ. H/o gout. Very painful, red, swollen and warm. Symptoms may have started after he ate red meat (normally doesn't eat this).   Strong fmhx stroke/MI (siblings) HLD - continues zetia and low dose crestor.  CAD/old MI - followed by cardiology last seen 05/2020   DM - does regularly check sugars fasting 90-120s. Compliant with antihyperglycemic regimen which includes: metformin 5011mdaily. Denies low sugars or hypoglycemic symptoms. Denies paresthesias. Last diabetic eye exam DUE. Glucometer brand: accuchek. Last foot exam: DUE. DSME: has not completed. Lab Results  Component Value Date   HGBA1C 6.3 (A) 07/29/2020   Diabetic Foot Exam - Simple   Simple Foot Form Diabetic Foot exam was performed with the following findings: Yes 07/29/2020  9:37 AM  Visual Inspection See comments: Yes Sensation Testing Intact to touch and monofilament testing bilaterally: Yes Pulse Check See comments: Yes Comments Diminished pedal pulses Thickened great toenails bilaterally Podagra to R 1st MTPJ    No results found for: MIDerl [place]     Relevant past medical, surgical, family and social history reviewed and updated as indicated. Interim medical history since our last visit reviewed. Allergies and medications reviewed and  updated. Outpatient Medications Prior to Visit  Medication Sig Dispense Refill   albuterol (VENTOLIN HFA) 108 (90 Base) MCG/ACT inhaler USE 2 INHALATIONS BY MOUTH  EVERY 6 HOURS AS NEEDED FOR WHEEZING OR SHORTNESS OF  BREATH 72 g 0   amLODipine-valsartan (EXFORGE) 5-160 MG tablet Take 0.5 tablets by mouth daily. 45 tablet 3   aspirin 81 MG chewable tablet Chew 81 mg by mouth daily.      ezetimibe (ZETIA) 10 MG tablet Take 1 tablet (10 mg total) by mouth daily. 90 tablet 3   metFORMIN (GLUCOPHAGE) 500 MG tablet TAKE 1 TABLET BY MOUTH EVERY DAY WITH BREAKFAST 90 tablet 3   Omega-3 Fatty Acids (FISH OIL) 1000 MG CAPS Take 2,000 mg by mouth in the morning and at bedtime.      Potassium 99 MG TABS Take 1-2 tablets (99-198 mg total) by mouth daily. (Patient taking differently: Take 99 mg by mouth daily.)     rosuvastatin (CRESTOR) 5 MG tablet Take 1 tablet (5 mg total) by mouth daily. 90 tablet 3   silodosin (RAPAFLO) 8 MG CAPS capsule Take 1 capsule (8 mg total) by mouth daily with breakfast. 90 capsule 3   No facility-administered medications prior to visit.     Per HPI unless specifically indicated in ROS section below Review of Systems  Objective:  BP 130/70   Pulse (!) 105   Temp (!) 97.4 F (36.3 C) (  Temporal)   Ht 5' 6.5" (1.689 m)   Wt 216 lb 9 oz (98.2 kg)   SpO2 95%   BMI 34.43 kg/m   Wt Readings from Last 3 Encounters:  07/29/20 216 lb 9 oz (98.2 kg)  05/21/20 223 lb (101.2 kg)  01/29/20 225 lb 5 oz (102.2 kg)      Physical Exam Vitals and nursing note reviewed.  Constitutional:      Appearance: Normal appearance. He is not ill-appearing.  Eyes:     Extraocular Movements: Extraocular movements intact.     Conjunctiva/sclera: Conjunctivae normal.     Pupils: Pupils are equal, round, and reactive to light.  Cardiovascular:     Rate and Rhythm: Tachycardia present. Rhythm irregularly irregular.     Pulses: Normal pulses.     Heart sounds: Normal heart sounds. No murmur  heard. Pulmonary:     Effort: Pulmonary effort is normal. No respiratory distress.     Breath sounds: Normal breath sounds. No wheezing, rhonchi or rales.  Musculoskeletal:     Right lower leg: No edema.     Left lower leg: No edema.     Comments:  See HPI for foot exam if done R 1st MTPJ swelling, warmth, very tender to palpation  No streaking redness   Skin:    General: Skin is warm and dry.     Findings: No rash.  Neurological:     Mental Status: He is alert.  Psychiatric:        Mood and Affect: Mood normal.        Behavior: Behavior normal.      Results for orders placed or performed in visit on 07/29/20  POCT glycosylated hemoglobin (Hb A1C)  Result Value Ref Range   Hemoglobin A1C 6.3 (A) 4.0 - 5.6 %   HbA1c POC (<> result, manual entry)     HbA1c, POC (prediabetic range)     HbA1c, POC (controlled diabetic range)     EKG - atrial fibrillation absent p waves rate ~110s, no hypertrophy or acute ST/T changes appreciated  Assessment & Plan:  This visit occurred during the SARS-CoV-2 public health emergency.  Safety protocols were in place, including screening questions prior to the visit, additional usage of staff PPE, and extensive cleaning of exam room while observing appropriate contact time as indicated for disinfecting solutions.   Problem List Items Addressed This Visit     CAD (coronary artery disease)    Appreciate cards care.        Relevant Medications   metoprolol succinate (TOPROL-XL) 25 MG 24 hr tablet   apixaban (ELIQUIS) 5 MG TABS tablet   Obesity, Class I, BMI 30-34.9    Congratulated on weight loss to date. Pt motivated to continue this.        Controlled diabetes mellitus type 2 with complications (HCC) - Primary    Chronic, improved on low dose metformin. Continues working on healthy low carb diet and weight loss. Congratulated.        Relevant Orders   POCT glycosylated hemoglobin (Hb A1C) (Completed)   Basic metabolic panel   Bilateral  primary osteoarthritis of knee    Latest steroid injection was 1 month ago       Relevant Medications   colchicine 0.6 MG tablet   Podagra    Story/exam consistent with this. Check labwork today (ESR, CBC, urate) and start colchicine PRN. rec hold statin on first day of colchicine (1.9m dose).  Relevant Medications   colchicine 0.6 MG tablet   Other Relevant Orders   Basic metabolic panel   Sedimentation rate   CBC with Differential/Platelet   Uric acid   Irregular heart rate    Update EKG today - afib - see below.        Relevant Orders   EKG 12-Lead (Completed)   Atrial fibrillation ([place])    New diagnosis. Unsure how long he's been in afib as pt asxs. Will start eliquis and toprol XL 68m daily. Will ask pt to call cardiology for f/u appt of new onset afib. Pt agrees with plan.  CHADSVASC2 score = 4       Relevant Medications   metoprolol succinate (TOPROL-XL) 25 MG 24 hr tablet   apixaban (ELIQUIS) 5 MG TABS tablet   Other Relevant Orders   TSH     Meds ordered this encounter  Medications   colchicine 0.6 MG tablet    Sig: Take 1 tablet (0.6 mg total) by mouth daily as needed (gout flare).    Dispense:  30 tablet    Refill:  0   metoprolol succinate (TOPROL-XL) 25 MG 24 hr tablet    Sig: Take 1 tablet (25 mg total) by mouth daily.    Dispense:  30 tablet    Refill:  6   apixaban (ELIQUIS) 5 MG TABS tablet    Sig: Take 1 tablet (5 mg total) by mouth 2 (two) times daily.    Dispense:  60 tablet    Refill:  6    Orders Placed This Encounter  Procedures   Basic metabolic panel   Sedimentation rate   CBC with Differential/Platelet   Uric acid   TSH   POCT glycosylated hemoglobin (Hb A1C)   EKG 12-Lead     Patient instructions: EKG today - new atrial fibrillation. Start toprol XL 218monce daily to slow down heart as well as eliquis blood thinner 58m108mwice daily. Call Dr VarHassell Donefice to schedule sooner follow up appointment for new onset  atrial fibrillation.  Let us [place] if any questions.  Schedule eye exam as you're due.  Let us [place] if interested in diabetes classes.  I am suspicious for gout flare - check labs today, start colchicine 2 pills daily on first day then 1 pill once daily until gout flare has improved. Hold crestor on first day of colchicine use.  Let us [place] if not improving with this.  Return as needed or in 6 months for physical/wellness visit   Follow up plan: Return in about 6 months (around 01/29/2021) for annual exam, prior fasting for blood work, medicare wellness visit.  JavRia BushD

## 2020-07-29 NOTE — Assessment & Plan Note (Signed)
Congratulated on weight loss to date. Pt motivated to continue this.

## 2020-07-29 NOTE — Assessment & Plan Note (Signed)
Appreciate cards care. 

## 2020-07-29 NOTE — Assessment & Plan Note (Addendum)
New diagnosis. Unsure how long he's been in afib as pt asxs. Will start eliquis and toprol XL '25mg'$  daily. Will ask pt to call cardiology for f/u appt of new onset afib. Pt agrees with plan.  CHADSVASC2 score = 4

## 2020-07-29 NOTE — Telephone Encounter (Signed)
Appointment has been scheduled.
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
## 2020-08-02 ENCOUNTER — Encounter: Payer: Self-pay | Admitting: Interventional Cardiology

## 2020-08-02 ENCOUNTER — Ambulatory Visit: Payer: 59 | Admitting: Interventional Cardiology

## 2020-08-02 ENCOUNTER — Other Ambulatory Visit: Payer: Self-pay

## 2020-08-02 VITALS — BP 116/84 | HR 100 | Ht 66.5 in | Wt 216.2 lb

## 2020-08-02 DIAGNOSIS — I4891 Unspecified atrial fibrillation: Secondary | ICD-10-CM

## 2020-08-02 DIAGNOSIS — I119 Hypertensive heart disease without heart failure: Secondary | ICD-10-CM

## 2020-08-02 DIAGNOSIS — I252 Old myocardial infarction: Secondary | ICD-10-CM

## 2020-08-02 DIAGNOSIS — I2510 Atherosclerotic heart disease of native coronary artery without angina pectoris: Secondary | ICD-10-CM

## 2020-08-02 DIAGNOSIS — E782 Mixed hyperlipidemia: Secondary | ICD-10-CM

## 2020-08-02 MED ORDER — METOPROLOL SUCCINATE ER 50 MG PO TB24: 50.0000 mg | ORAL_TABLET | Freq: Every day | ORAL | 3 refills | Status: DC

## 2020-08-02 NOTE — Patient Instructions (Addendum)
Medication Instructions:  Your physician has recommended you make the following change in your medication: Increase metoprolol succinate to 50 mg by mouth daily  *If you need a refill on your cardiac medications before your next appointment, please call your pharmacy*   Lab Work: Your physician recommends that you return for lab work on August 19,2022.  CBC and BMP.  This is not fasting.  The lab is open from 7:30 AM -4:15 PM  If you have labs (blood work) drawn today and your tests are completely normal, you will receive your results only by: [place] (if you have MyChart) OR A paper copy in the mail If you have any lab test that is abnormal or we need to change your treatment, we will call you to review the results.   Testing/Procedures: Your physician has requested that you have an echocardiogram. Echocardiography is a painless test that uses sound waves to create images of your heart. It provides your doctor with information about the size and shape of your heart and how well your heart's chambers and valves are working. This procedure takes approximately one hour. There are no restrictions for this procedure. Please schedule before cardioversion    Your physician has recommended that you have a Cardioversion (DCCV). Electrical Cardioversion uses a jolt of electricity to your heart either through paddles or wired patches attached to your chest. This is a controlled, usually prescheduled, procedure. Defibrillation is done under light anesthesia in the hospital, and you usually go home the day of the procedure. This is done to get your heart back into a normal rhythm. You are not awake for the procedure. Please see the instruction sheet given to you today. Scheduled for August 24,2022    Follow-Up: At [facility], you and your health needs are our priority.  As part of our continuing mission to provide you with exceptional heart care, we have created designated Provider Care  Teams.  These Care Teams include your primary Cardiologist (physician) and Advanced Practice Providers (APPs -  Physician Assistants and Nurse Practitioners) who all work together to provide you with the care you need, when you need it.  We recommend signing up for the patient portal called "MyChart".  Sign up information is provided on this After Visit Summary.  MyChart is used to connect with patients for Virtual Visits (Telemedicine).  Patients are able to view lab/test results, encounter notes, upcoming appointments, etc.  Non-urgent messages can be sent to your provider as well.   To learn more about what you can do with MyChart, go to [email or web address].    Your next appointment:   8 week(s)  The format for your next appointment:   In Person  Provider:   You may see Larae Grooms, MD or one of the following Advanced Practice Providers on your designated Care Team:   Melina Copa, [place] Ermalinda Barrios, PA-C   Other Instructions    You are scheduled for a TEE/Cardioversion/TEE Cardioversion on August 24,2022 with Dr. Harrell Gave.  Please arrive at the [facility] (Main Entrance A) at [facility]: [address] [place], [place] [address] at 6:30 am   DIET: Nothing to eat or drink after midnight except a sip of water with medications (see medication instructions below)  FYI: For your safety, and to allow [name] to monitor your vital signs accurately during the surgery/procedure we request that   if you have artificial nails, gel coating, SNS etc. Please have those removed prior to your surgery/procedure. Not  having the nail coverings /polish removed may result in cancellation or delay of your surgery/procedure.   Medication Instructions:  Do not take metformin the morning of the procedure    Continue your anticoagulant: Eliquis You will need to continue your anticoagulant after your procedure until you  are told by your  Provider that it is safe to stop    Come to  the lab at [facility] between the hours of 7:30 am and 4:30 pm. You do not have to be fasting. To be done on August 19,2022   You must have a responsible person to drive you home and stay in the waiting area during your procedure. Failure to do so could result in cancellation.  Bring your insurance cards.  *Special Note: Every effort is made to have your procedure done on time. Occasionally there are emergencies that occur at the hospital that may cause delays. Please be patient if a delay does occur.

## 2020-08-07 ENCOUNTER — Ambulatory Visit (HOSPITAL_COMMUNITY): Payer: Medicare Other | Attending: Cardiovascular Disease

## 2020-08-07 ENCOUNTER — Other Ambulatory Visit: Payer: Self-pay

## 2020-08-07 ENCOUNTER — Other Ambulatory Visit (HOSPITAL_COMMUNITY): Payer: Medicare Other

## 2020-08-07 DIAGNOSIS — I2510 Atherosclerotic heart disease of native coronary artery without angina pectoris: Secondary | ICD-10-CM | POA: Diagnosis present

## 2020-08-07 DIAGNOSIS — I119 Hypertensive heart disease without heart failure: Secondary | ICD-10-CM | POA: Insufficient documentation

## 2020-08-07 DIAGNOSIS — I252 Old myocardial infarction: Secondary | ICD-10-CM | POA: Insufficient documentation

## 2020-08-07 DIAGNOSIS — I4891 Unspecified atrial fibrillation: Secondary | ICD-10-CM | POA: Diagnosis present

## 2020-08-07 LAB — ECHOCARDIOGRAM COMPLETE
Area-P 1/2: 2.73 cm2
S' Lateral: 2.2 cm

## 2020-08-20 ENCOUNTER — Other Ambulatory Visit: Payer: Self-pay | Admitting: Family Medicine

## 2020-08-21 NOTE — Telephone Encounter (Signed)
Last filled on 07/29/20 #30 with 0 refill LOV 07/29/20 DM  Next appointment on 01/29/21 for CPE

## 2020-08-22 ENCOUNTER — Other Ambulatory Visit: Payer: Self-pay

## 2020-08-22 ENCOUNTER — Other Ambulatory Visit: Payer: Medicare Other

## 2020-08-22 DIAGNOSIS — I4891 Unspecified atrial fibrillation: Secondary | ICD-10-CM

## 2020-08-22 DIAGNOSIS — I2510 Atherosclerotic heart disease of native coronary artery without angina pectoris: Secondary | ICD-10-CM

## 2020-08-22 DIAGNOSIS — I252 Old myocardial infarction: Secondary | ICD-10-CM

## 2020-08-22 LAB — BASIC METABOLIC PANEL
BUN/Creatinine Ratio: 16 (ref 10–24)
BUN: 12 mg/dL (ref 8–27)
CO2: 30 mmol/L — ABNORMAL HIGH (ref 20–29)
Calcium: 9.5 mg/dL (ref 8.6–10.2)
Chloride: 102 mmol/L (ref 96–106)
Creatinine, Ser: 0.74 mg/dL — ABNORMAL LOW (ref 0.76–1.27)
Glucose: 145 mg/dL — ABNORMAL HIGH (ref 65–99)
Potassium: 4.2 mmol/L (ref 3.5–5.2)
Sodium: 140 mmol/L (ref 134–144)
eGFR: 99 mL/min/{1.73_m2} (ref >59)

## 2020-08-22 LAB — CBC
Hematocrit: 39.8 % (ref 37.5–51.0)
Hemoglobin: 13.9 g/dL (ref 13.0–17.7)
MCH: 33.8 pg — ABNORMAL HIGH (ref 26.6–33.0)
MCHC: 34.9 g/dL (ref 31.5–35.7)
MCV: 97 fL (ref 79–97)
Platelets: 181 10*3/uL (ref 150–450)
RBC: 4.11 x10E6/uL — ABNORMAL LOW (ref 4.14–5.80)
RDW: 12.9 % (ref 11.6–15.4)
WBC: 4.7 10*3/uL (ref 3.4–10.8)

## 2020-08-22 NOTE — Telephone Encounter (Signed)
ERx 

## 2020-08-23 ENCOUNTER — Other Ambulatory Visit: Payer: Medicare Other

## 2020-08-27 ENCOUNTER — Other Ambulatory Visit: Payer: Self-pay | Admitting: Interventional Cardiology

## 2020-08-27 DIAGNOSIS — R238 Other skin changes: Secondary | ICD-10-CM

## 2020-08-28 ENCOUNTER — Ambulatory Visit (HOSPITAL_COMMUNITY): Payer: Medicare Other | Admitting: Certified Registered"

## 2020-08-28 ENCOUNTER — Ambulatory Visit (HOSPITAL_COMMUNITY)
Admission: RE | Admit: 2020-08-28 | Discharge: 2020-08-28 | Disposition: A | Discharge status: Home / Self Care | Payer: Medicare Other | Attending: Cardiology | Admitting: Cardiology

## 2020-08-28 ENCOUNTER — Other Ambulatory Visit: Payer: Self-pay

## 2020-08-28 ENCOUNTER — Encounter (HOSPITAL_COMMUNITY): Payer: Self-pay | Admitting: Cardiology

## 2020-08-28 ENCOUNTER — Encounter (HOSPITAL_COMMUNITY)
Admission: RE | Disposition: A | Discharge status: Home / Self Care | Payer: Self-pay | Source: Home / Self Care | Attending: Cardiology

## 2020-08-28 DIAGNOSIS — Z79899 Other long term (current) drug therapy: Secondary | ICD-10-CM | POA: Diagnosis not present

## 2020-08-28 DIAGNOSIS — E785 Hyperlipidemia, unspecified: Secondary | ICD-10-CM | POA: Diagnosis not present

## 2020-08-28 DIAGNOSIS — I4819 Other persistent atrial fibrillation: Secondary | ICD-10-CM | POA: Diagnosis not present

## 2020-08-28 DIAGNOSIS — Z8249 Family history of ischemic heart disease and other diseases of the circulatory system: Secondary | ICD-10-CM | POA: Diagnosis not present

## 2020-08-28 DIAGNOSIS — Z7984 Long term (current) use of oral hypoglycemic drugs: Secondary | ICD-10-CM | POA: Insufficient documentation

## 2020-08-28 DIAGNOSIS — Z96611 Presence of right artificial shoulder joint: Secondary | ICD-10-CM | POA: Insufficient documentation

## 2020-08-28 DIAGNOSIS — R238 Other skin changes: Secondary | ICD-10-CM

## 2020-08-28 DIAGNOSIS — Z7901 Long term (current) use of anticoagulants: Secondary | ICD-10-CM | POA: Diagnosis not present

## 2020-08-28 DIAGNOSIS — I119 Hypertensive heart disease without heart failure: Secondary | ICD-10-CM | POA: Diagnosis not present

## 2020-08-28 DIAGNOSIS — Z96612 Presence of left artificial shoulder joint: Secondary | ICD-10-CM | POA: Insufficient documentation

## 2020-08-28 DIAGNOSIS — I2510 Atherosclerotic heart disease of native coronary artery without angina pectoris: Secondary | ICD-10-CM | POA: Diagnosis not present

## 2020-08-28 DIAGNOSIS — Z7982 Long term (current) use of aspirin: Secondary | ICD-10-CM | POA: Insufficient documentation

## 2020-08-28 DIAGNOSIS — I252 Old myocardial infarction: Secondary | ICD-10-CM | POA: Insufficient documentation

## 2020-08-28 DIAGNOSIS — Z87891 Personal history of nicotine dependence: Secondary | ICD-10-CM | POA: Insufficient documentation

## 2020-08-28 DIAGNOSIS — I4891 Unspecified atrial fibrillation: Secondary | ICD-10-CM | POA: Insufficient documentation

## 2020-08-28 HISTORY — PX: CARDIOVERSION: SHX1299

## 2020-08-28 SURGERY — CARDIOVERSION
Anesthesia: General

## 2020-08-28 MED ORDER — PROPOFOL 10 MG/ML IV BOLUS
INTRAVENOUS | Status: DC | PRN
  Administered 2020-08-28: 100 mg via INTRAVENOUS

## 2020-08-28 MED ORDER — LIDOCAINE HCL (CARDIAC) PF 100 MG/5ML IV SOSY
PREFILLED_SYRINGE | INTRAVENOUS | Status: DC | PRN
  Administered 2020-08-28: 60 mg via INTRAVENOUS

## 2020-08-28 MED ORDER — SODIUM CHLORIDE 0.9 % IV SOLN: INTRAVENOUS | Status: DC | PRN

## 2020-08-28 MED ORDER — HYDROCORTISONE 1 % EX CREA: 1.0000 "application " | TOPICAL_CREAM | Freq: Three times a day (TID) | CUTANEOUS | Status: DC | PRN

## 2020-08-28 NOTE — Anesthesia Postprocedure Evaluation (Signed)
Anesthesia Post Note  Patient: Travis Howell  Procedure(s) Performed: CARDIOVERSION     Patient location during evaluation: Endoscopy Anesthesia Type: General Level of consciousness: awake and alert Pain management: pain level controlled Vital Signs Assessment: post-procedure vital signs reviewed and stable Respiratory status: spontaneous breathing, nonlabored ventilation, respiratory function stable and patient connected to nasal cannula oxygen Cardiovascular status: blood pressure returned to baseline and stable Postop Assessment: no apparent nausea or vomiting Anesthetic complications: no   No notable events documented.  Last Vitals:  Vitals:   08/28/20 0755 08/28/20 0803  BP: 97/69 126/78  Pulse: 74 68  Resp: (!) 21 14  Temp:    SpO2: 95% 97%    Last Pain:  Vitals:   08/28/20 0803  TempSrc:   PainSc: 0-No pain                 [name]

## 2020-08-28 NOTE — CV Procedure (Signed)
Procedure:   DCCV  Indication:  Symptomatic atrial fibrillation  Procedure Note:  The patient signed informed consent.  They have had had therapeutic anticoagulation with apixaban greater than 3 weeks.  Anesthesia was administered by Dr. Marcie Bal.  Patient received 60 mg IV lidocaine and 100 mg IV propofol. Adequate airway was maintained throughout and vital followed per protocol.  They were cardioverted x 2 with 150, 200J of biphasic synchronized energy.  They converted to NSR.  There were no apparent complications.  The patient had normal neuro status and respiratory status post procedure with vitals stable as recorded elsewhere.    Follow up:  They will continue on current medical therapy and follow up with cardiology as scheduled.  [name], MD PhD 08/28/2020 7:42 AM

## 2020-08-28 NOTE — Interval H&P Note (Signed)
History and Physical Interval Note:  08/28/2020 7:27 AM  Travis Howell  has presented today for surgery, with the diagnosis of AFIB.  The various methods of treatment have been discussed with the patient and family. After consideration of risks, benefits and other options for treatment, the patient has consented to  Procedure(s): CARDIOVERSION (N/A) as a surgical intervention.  The patient's history has been reviewed, patient examined, no change in status, stable for surgery.  I have reviewed the patient's chart and labs.  Questions were answered to the patient's satisfaction.     [name] Harrell Gave

## 2020-08-28 NOTE — Transfer of Care (Signed)
Immediate Anesthesia Transfer of Care Note  Patient: Travis Howell  Procedure(s) Performed: CARDIOVERSION  Patient Location: Endoscopy Unit  Anesthesia Type:General  Level of Consciousness: awake, drowsy and patient cooperative  Airway & Oxygen Therapy: Patient Spontanous Breathing  Post-op Assessment: Report given to RN, Post -op Vital signs reviewed and stable and Patient moving all extremities X 4  Post vital signs: Reviewed and stable  Last Vitals:  Vitals Value Taken Time  BP    Temp    Pulse    Resp    SpO2      Last Pain:  Vitals:   08/28/20 0705  TempSrc: Temporal  PainSc: 0-No pain         Complications: No notable events documented.

## 2020-08-28 NOTE — Anesthesia Preprocedure Evaluation (Signed)
Anesthesia Evaluation  Patient identified by MRN, date of birth, ID band Patient awake    Reviewed: Allergy & Precautions, H&P , NPO status , Patient's Chart, lab work & pertinent test results  Airway Mallampati: II   Neck ROM: full    Dental   Pulmonary shortness of breath, former smoker,    breath sounds clear to auscultation       Cardiovascular hypertension, + CAD, + Past MI and + Cardiac Stents  + dysrhythmias Atrial Fibrillation  Rhythm:irregular Rate:Normal     Neuro/Psych PSYCHIATRIC DISORDERS Anxiety Depression    GI/Hepatic   Endo/Other  diabetes, Type 2  Renal/GU      Musculoskeletal  (+) Arthritis ,   Abdominal   Peds  Hematology   Anesthesia Other Findings   Reproductive/Obstetrics                             Anesthesia Physical Anesthesia Plan  ASA: 3  Anesthesia Plan: General   Post-op Pain Management:    Induction: Intravenous  PONV Risk Score and Plan: 2 and Propofol infusion and Treatment may vary due to age or medical condition  Airway Management Planned: Mask  Additional Equipment:   Intra-op Plan:   Post-operative Plan:   Informed Consent: I have reviewed the patients History and Physical, chart, labs and discussed the procedure including the risks, benefits and alternatives for the proposed anesthesia with the patient or authorized representative who has indicated his/her understanding and acceptance.     Dental advisory given  Plan Discussed with: CRNA, Anesthesiologist and Surgeon  Anesthesia Plan Comments:         Anesthesia Quick Evaluation

## 2020-08-29 ENCOUNTER — Encounter (HOSPITAL_COMMUNITY): Payer: Self-pay | Admitting: Cardiology

## 2020-09-05 ENCOUNTER — Ambulatory Visit: Payer: 59 | Admitting: Interventional Cardiology

## 2020-09-10 LAB — HM DIABETES EYE EXAM

## 2020-09-13 ENCOUNTER — Telehealth: Payer: Self-pay | Admitting: Family Medicine
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
## 2020-09-16 ENCOUNTER — Other Ambulatory Visit: Payer: Self-pay

## 2020-09-16 ENCOUNTER — Encounter: Payer: Self-pay | Admitting: Interventional Cardiology

## 2020-09-16 ENCOUNTER — Ambulatory Visit: Payer: Medicare Other | Admitting: Interventional Cardiology

## 2020-09-16 VITALS — BP 130/82 | HR 99 | Ht 68.0 in | Wt 219.2 lb

## 2020-09-16 DIAGNOSIS — I4891 Unspecified atrial fibrillation: Secondary | ICD-10-CM | POA: Diagnosis not present

## 2020-09-16 DIAGNOSIS — E782 Mixed hyperlipidemia: Secondary | ICD-10-CM

## 2020-09-16 DIAGNOSIS — I252 Old myocardial infarction: Secondary | ICD-10-CM

## 2020-09-16 DIAGNOSIS — I2510 Atherosclerotic heart disease of native coronary artery without angina pectoris: Secondary | ICD-10-CM

## 2020-09-16 DIAGNOSIS — I119 Hypertensive heart disease without heart failure: Secondary | ICD-10-CM

## 2020-09-16 NOTE — Patient Instructions (Signed)
Medication Instructions:  Your physician recommends that you continue on your current medications as directed. Please refer to the Current Medication list given to you today.  *If you need a refill on your cardiac medications before your next appointment, please call your pharmacy*   Lab Work: none If you have labs (blood work) drawn today and your tests are completely normal, you will receive your results only by: [place] (if you have MyChart) OR A paper copy in the mail If you have any lab test that is abnormal or we need to change your treatment, we will call you to review the results.   Testing/Procedures: none   Follow-Up: At [facility], you and your health needs are our priority.  As part of our continuing mission to provide you with exceptional heart care, we have created designated Provider Care Teams.  These Care Teams include your primary Cardiologist (physician) and Advanced Practice Providers (APPs -  Physician Assistants and Nurse Practitioners) who all work together to provide you with the care you need, when you need it.  We recommend signing up for the patient portal called "MyChart".  Sign up information is provided on this After Visit Summary.  MyChart is used to connect with patients for Virtual Visits (Telemedicine).  Patients are able to view lab/test results, encounter notes, upcoming appointments, etc.  Non-urgent messages can be sent to your provider as well.   To learn more about what you can do with MyChart, go to [email or web address].    Your next appointment:   Based on EP recommendations  The format for your next appointment:   In Person  Provider:   You may see Larae Grooms, MD or one of the following Advanced Practice Providers on your designated Care Team:   Melina Copa, PA-C Ermalinda Barrios, PA-C   Other Instructions

## 2020-09-19 ENCOUNTER — Other Ambulatory Visit: Payer: Self-pay | Admitting: *Deleted

## 2020-09-19 DIAGNOSIS — I4891 Unspecified atrial fibrillation: Secondary | ICD-10-CM

## 2020-09-19 NOTE — Progress Notes (Signed)
EP referral

## 2020-09-20 NOTE — Telephone Encounter (Signed)
I sent a MyChart to pt to see if he needs this at [facility] as it is an as needed rx.

## 2020-09-23 ENCOUNTER — Other Ambulatory Visit: Payer: Self-pay | Admitting: Family Medicine

## 2020-09-23 ENCOUNTER — Inpatient Hospital Stay (HOSPITAL_COMMUNITY)
Admission: EM | Admit: 2020-09-23 | Discharge: 2020-09-28 | DRG: 854 | Disposition: A | Discharge status: Home / Self Care | Payer: Medicare Other | Attending: Internal Medicine | Admitting: Internal Medicine

## 2020-09-23 ENCOUNTER — Other Ambulatory Visit: Payer: Self-pay

## 2020-09-23 ENCOUNTER — Encounter (HOSPITAL_COMMUNITY): Payer: Self-pay | Admitting: Emergency Medicine

## 2020-09-23 ENCOUNTER — Emergency Department (HOSPITAL_COMMUNITY): Payer: Medicare Other

## 2020-09-23 DIAGNOSIS — N179 Acute kidney failure, unspecified: Secondary | ICD-10-CM | POA: Diagnosis present

## 2020-09-23 DIAGNOSIS — Z8249 Family history of ischemic heart disease and other diseases of the circulatory system: Secondary | ICD-10-CM

## 2020-09-23 DIAGNOSIS — I252 Old myocardial infarction: Secondary | ICD-10-CM

## 2020-09-23 DIAGNOSIS — Z87891 Personal history of nicotine dependence: Secondary | ICD-10-CM

## 2020-09-23 DIAGNOSIS — I4891 Unspecified atrial fibrillation: Secondary | ICD-10-CM | POA: Diagnosis present

## 2020-09-23 DIAGNOSIS — Z820 Family history of epilepsy and other diseases of the nervous system: Secondary | ICD-10-CM

## 2020-09-23 DIAGNOSIS — Z9842 Cataract extraction status, left eye: Secondary | ICD-10-CM

## 2020-09-23 DIAGNOSIS — E872 Acidosis: Secondary | ICD-10-CM | POA: Diagnosis present

## 2020-09-23 DIAGNOSIS — I959 Hypotension, unspecified: Secondary | ICD-10-CM | POA: Diagnosis present

## 2020-09-23 DIAGNOSIS — Z96611 Presence of right artificial shoulder joint: Secondary | ICD-10-CM | POA: Diagnosis present

## 2020-09-23 DIAGNOSIS — A4150 Gram-negative sepsis, unspecified: Secondary | ICD-10-CM | POA: Diagnosis present

## 2020-09-23 DIAGNOSIS — A4151 Sepsis due to Escherichia coli [E. coli]: Principal | ICD-10-CM | POA: Diagnosis present

## 2020-09-23 DIAGNOSIS — Z79899 Other long term (current) drug therapy: Secondary | ICD-10-CM

## 2020-09-23 DIAGNOSIS — D6959 Other secondary thrombocytopenia: Secondary | ICD-10-CM | POA: Diagnosis present

## 2020-09-23 DIAGNOSIS — G2581 Restless legs syndrome: Secondary | ICD-10-CM | POA: Diagnosis present

## 2020-09-23 DIAGNOSIS — Z961 Presence of intraocular lens: Secondary | ICD-10-CM | POA: Diagnosis present

## 2020-09-23 DIAGNOSIS — Z955 Presence of coronary angioplasty implant and graft: Secondary | ICD-10-CM

## 2020-09-23 DIAGNOSIS — R6520 Severe sepsis without septic shock: Secondary | ICD-10-CM | POA: Diagnosis present

## 2020-09-23 DIAGNOSIS — Z7984 Long term (current) use of oral hypoglycemic drugs: Secondary | ICD-10-CM

## 2020-09-23 DIAGNOSIS — D649 Anemia, unspecified: Secondary | ICD-10-CM | POA: Diagnosis present

## 2020-09-23 DIAGNOSIS — N401 Enlarged prostate with lower urinary tract symptoms: Secondary | ICD-10-CM | POA: Diagnosis present

## 2020-09-23 DIAGNOSIS — Z20822 Contact with and (suspected) exposure to covid-19: Secondary | ICD-10-CM | POA: Diagnosis present

## 2020-09-23 DIAGNOSIS — A419 Sepsis, unspecified organism: Secondary | ICD-10-CM | POA: Diagnosis present

## 2020-09-23 DIAGNOSIS — E119 Type 2 diabetes mellitus without complications: Secondary | ICD-10-CM | POA: Diagnosis present

## 2020-09-23 DIAGNOSIS — E785 Hyperlipidemia, unspecified: Secondary | ICD-10-CM | POA: Diagnosis present

## 2020-09-23 DIAGNOSIS — Z96612 Presence of left artificial shoulder joint: Secondary | ICD-10-CM | POA: Diagnosis present

## 2020-09-23 DIAGNOSIS — J60 Coalworker's pneumoconiosis: Secondary | ICD-10-CM | POA: Diagnosis present

## 2020-09-23 DIAGNOSIS — N12 Tubulo-interstitial nephritis, not specified as acute or chronic: Secondary | ICD-10-CM

## 2020-09-23 DIAGNOSIS — N111 Chronic obstructive pyelonephritis: Secondary | ICD-10-CM | POA: Diagnosis present

## 2020-09-23 DIAGNOSIS — R062 Wheezing: Secondary | ICD-10-CM | POA: Diagnosis not present

## 2020-09-23 DIAGNOSIS — E860 Dehydration: Secondary | ICD-10-CM | POA: Diagnosis present

## 2020-09-23 DIAGNOSIS — Z9841 Cataract extraction status, right eye: Secondary | ICD-10-CM

## 2020-09-23 DIAGNOSIS — E118 Type 2 diabetes mellitus with unspecified complications: Secondary | ICD-10-CM

## 2020-09-23 DIAGNOSIS — F32A Depression, unspecified: Secondary | ICD-10-CM | POA: Diagnosis present

## 2020-09-23 DIAGNOSIS — I4819 Other persistent atrial fibrillation: Secondary | ICD-10-CM | POA: Diagnosis present

## 2020-09-23 DIAGNOSIS — I10 Essential (primary) hypertension: Secondary | ICD-10-CM | POA: Diagnosis present

## 2020-09-23 DIAGNOSIS — Z6833 Body mass index (BMI) 33.0-33.9, adult: Secondary | ICD-10-CM

## 2020-09-23 DIAGNOSIS — I2510 Atherosclerotic heart disease of native coronary artery without angina pectoris: Secondary | ICD-10-CM | POA: Diagnosis present

## 2020-09-23 DIAGNOSIS — E669 Obesity, unspecified: Secondary | ICD-10-CM | POA: Diagnosis present

## 2020-09-23 DIAGNOSIS — F419 Anxiety disorder, unspecified: Secondary | ICD-10-CM | POA: Diagnosis present

## 2020-09-23 DIAGNOSIS — Z87440 Personal history of urinary (tract) infections: Secondary | ICD-10-CM

## 2020-09-23 DIAGNOSIS — Z7901 Long term (current) use of anticoagulants: Secondary | ICD-10-CM

## 2020-09-23 DIAGNOSIS — Z833 Family history of diabetes mellitus: Secondary | ICD-10-CM

## 2020-09-23 DIAGNOSIS — N390 Urinary tract infection, site not specified: Secondary | ICD-10-CM | POA: Diagnosis present

## 2020-09-23 LAB — COMPREHENSIVE METABOLIC PANEL
ALT: 18 U/L (ref 0–44)
AST: 21 U/L (ref 15–41)
Albumin: 4.1 g/dL (ref 3.5–5.0)
Alkaline Phosphatase: 62 U/L (ref 38–126)
Anion gap: 14 (ref 5–15)
BUN: 27 mg/dL — ABNORMAL HIGH (ref 8–23)
CO2: 26 mmol/L (ref 22–32)
Calcium: 9.3 mg/dL (ref 8.9–10.3)
Chloride: 98 mmol/L (ref 98–111)
Creatinine, Ser: 1.44 mg/dL — ABNORMAL HIGH (ref 0.61–1.24)
GFR, Estimated: 53 mL/min — ABNORMAL LOW (ref >60)
Glucose, Bld: 229 mg/dL — ABNORMAL HIGH (ref 70–99)
Potassium: 4.4 mmol/L (ref 3.5–5.1)
Sodium: 138 mmol/L (ref 135–145)
Total Bilirubin: 1.4 mg/dL — ABNORMAL HIGH (ref 0.3–1.2)
Total Protein: 7.8 g/dL (ref 6.5–8.1)

## 2020-09-23 LAB — CBC WITH DIFFERENTIAL/PLATELET
Abs Immature Granulocytes: 0.05 10*3/uL (ref 0.00–0.07)
Basophils Absolute: 0 10*3/uL (ref 0.0–0.1)
Basophils Relative: 0 %
Eosinophils Absolute: 0 10*3/uL (ref 0.0–0.5)
Eosinophils Relative: 0 %
HCT: 38.8 % — ABNORMAL LOW (ref 39.0–52.0)
Hemoglobin: 13.3 g/dL (ref 13.0–17.0)
Immature Granulocytes: 1 %
Lymphocytes Relative: 4 %
Lymphs Abs: 0.3 10*3/uL — ABNORMAL LOW (ref 0.7–4.0)
MCH: 33.9 pg (ref 26.0–34.0)
MCHC: 34.3 g/dL (ref 30.0–36.0)
MCV: 99 fL (ref 80.0–100.0)
Monocytes Absolute: 0.7 10*3/uL (ref 0.1–1.0)
Monocytes Relative: 8 %
Neutro Abs: 8.1 10*3/uL — ABNORMAL HIGH (ref 1.7–7.7)
Neutrophils Relative %: 87 %
Platelets: 165 10*3/uL (ref 150–400)
RBC: 3.92 MIL/uL — ABNORMAL LOW (ref 4.22–5.81)
RDW: 12.8 % (ref 11.5–15.5)
WBC: 9.3 10*3/uL (ref 4.0–10.5)
nRBC: 0 % (ref 0.0–0.2)

## 2020-09-23 LAB — LACTIC ACID, PLASMA
Lactic Acid, Venous: 3.1 mmol/L (ref 0.5–1.9)
Lactic Acid, Venous: 1.5 mmol/L (ref 0.5–1.9)

## 2020-09-23 LAB — URINALYSIS, ROUTINE W REFLEX MICROSCOPIC
Glucose, UA: NEGATIVE mg/dL
Nitrite: POSITIVE — AB
Protein, ur: >=300 mg/dL — AB
RBC / HPF: >50 RBC/hpf — ABNORMAL HIGH (ref 0–5)
Specific Gravity, Urine: 1.025 (ref 1.005–1.030)
WBC, UA: >50 WBC/hpf — ABNORMAL HIGH (ref 0–5)
pH: 6 (ref 5.0–8.0)

## 2020-09-23 LAB — RESP PANEL BY RT-PCR (FLU A&B, COVID) ARPGX2
Influenza A by PCR: NEGATIVE
Influenza B by PCR: NEGATIVE
SARS Coronavirus 2 by RT PCR: NEGATIVE

## 2020-09-23 MED ORDER — SODIUM CHLORIDE 0.9 % IV BOLUS
1000.0000 mL | Freq: Once | INTRAVENOUS | Status: AC
  Administered 2020-09-23: 1000 mL via INTRAVENOUS

## 2020-09-23 MED ORDER — APIXABAN 5 MG PO TABS
5.0000 mg | ORAL_TABLET | Freq: Two times a day (BID) | ORAL | Status: DC
  Administered 2020-09-23 – 2020-09-28 (×10): 5 mg via ORAL
  Filled 2020-09-23 (×10): qty 1

## 2020-09-23 MED ORDER — SODIUM CHLORIDE 0.9 % IV SOLN
2.0000 g | INTRAVENOUS | Status: AC
  Administered 2020-09-24 – 2020-09-25 (×2): 2 g via INTRAVENOUS
  Filled 2020-09-23 (×2): qty 20

## 2020-09-23 MED ORDER — ALBUTEROL SULFATE HFA 108 (90 BASE) MCG/ACT IN AERS
1.0000 | INHALATION_SPRAY | RESPIRATORY_TRACT | Status: DC | PRN
  Administered 2020-09-25 – 2020-09-27 (×4): 2 via RESPIRATORY_TRACT
  Filled 2020-09-23: qty 6.7

## 2020-09-23 MED ORDER — LACTATED RINGERS IV SOLN: INTRAVENOUS | Status: DC

## 2020-09-23 MED ORDER — INSULIN ASPART 100 UNIT/ML IJ SOLN
0.0000 [IU] | Freq: Three times a day (TID) | INTRAMUSCULAR | Status: DC
  Administered 2020-09-24: 1 [IU] via SUBCUTANEOUS
  Administered 2020-09-24: 2 [IU] via SUBCUTANEOUS
  Administered 2020-09-24 – 2020-09-25 (×2): 1 [IU] via SUBCUTANEOUS
  Administered 2020-09-25 – 2020-09-26 (×4): 2 [IU] via SUBCUTANEOUS
  Administered 2020-09-27: 7 [IU] via SUBCUTANEOUS
  Administered 2020-09-27: 2 [IU] via SUBCUTANEOUS
  Administered 2020-09-28: 3 [IU] via SUBCUTANEOUS
  Administered 2020-09-28: 5 [IU] via SUBCUTANEOUS
  Filled 2020-09-23: qty 0.09

## 2020-09-23 MED ORDER — ROSUVASTATIN CALCIUM 5 MG PO TABS
5.0000 mg | ORAL_TABLET | Freq: Every day | ORAL | Status: DC
  Administered 2020-09-23 – 2020-09-28 (×6): 5 mg via ORAL
  Filled 2020-09-23 (×6): qty 1

## 2020-09-23 MED ORDER — METOPROLOL SUCCINATE ER 50 MG PO TB24: 25.0000 mg | ORAL_TABLET | Freq: Two times a day (BID) | ORAL | Status: DC

## 2020-09-23 MED ORDER — SODIUM CHLORIDE 0.9 % IV BOLUS (SEPSIS)
1000.0000 mL | Freq: Once | INTRAVENOUS | Status: AC
  Administered 2020-09-23: 1000 mL via INTRAVENOUS

## 2020-09-23 MED ORDER — METOPROLOL TARTRATE 25 MG PO TABS
12.5000 mg | ORAL_TABLET | Freq: Once | ORAL | Status: AC
  Administered 2020-09-23: 12.5 mg via ORAL
  Filled 2020-09-23: qty 1

## 2020-09-23 MED ORDER — SODIUM CHLORIDE 0.9 % IV SOLN: INTRAVENOUS | Status: DC

## 2020-09-23 MED ORDER — ACETAMINOPHEN 650 MG RE SUPP: 650.0000 mg | Freq: Four times a day (QID) | RECTAL | Status: DC | PRN

## 2020-09-23 MED ORDER — SENNOSIDES-DOCUSATE SODIUM 8.6-50 MG PO TABS: 1.0000 | ORAL_TABLET | Freq: Every evening | ORAL | Status: DC | PRN

## 2020-09-23 MED ORDER — SODIUM CHLORIDE 0.9 % IV SOLN
2.0000 g | Freq: Once | INTRAVENOUS | Status: AC
  Administered 2020-09-23: 2 g via INTRAVENOUS
  Filled 2020-09-23: qty 20

## 2020-09-23 MED ORDER — EZETIMIBE 10 MG PO TABS
10.0000 mg | ORAL_TABLET | Freq: Every day | ORAL | Status: DC
  Administered 2020-09-23 – 2020-09-28 (×6): 10 mg via ORAL
  Filled 2020-09-23 (×6): qty 1

## 2020-09-23 MED ORDER — TAMSULOSIN HCL 0.4 MG PO CAPS: 0.4000 mg | ORAL_CAPSULE | Freq: Every day | ORAL | Status: DC

## 2020-09-23 MED ORDER — ASPIRIN 81 MG PO CHEW
81.0000 mg | CHEWABLE_TABLET | Freq: Every day | ORAL | Status: DC
  Administered 2020-09-24 – 2020-09-28 (×5): 81 mg via ORAL
  Filled 2020-09-23 (×5): qty 1

## 2020-09-23 MED ORDER — TAMSULOSIN HCL 0.4 MG PO CAPS
0.4000 mg | ORAL_CAPSULE | Freq: Every day | ORAL | Status: DC
  Administered 2020-09-24 – 2020-09-27 (×4): 0.4 mg via ORAL
  Filled 2020-09-23 (×4): qty 1

## 2020-09-23 MED ORDER — ACETAMINOPHEN 325 MG PO TABS
650.0000 mg | ORAL_TABLET | Freq: Four times a day (QID) | ORAL | Status: DC | PRN
  Administered 2020-09-24 – 2020-09-26 (×4): 650 mg via ORAL
  Filled 2020-09-23 (×4): qty 2

## 2020-09-23 NOTE — Telephone Encounter (Signed)
Refill request colchicine Last office visit 01/29/20 Last refill 08/22/20 #30/1 Upcoming appointment 01/29/21 Patient is currently at the ED

## 2020-09-23 NOTE — ED Notes (Signed)
Pt remains Afib RVR despite fluid resuscitation.  MD made aware.

## 2020-09-23 NOTE — Telephone Encounter (Signed)
Lvm asking pt to call back.  Need to know if he still gets med at [facility] or through [facility] now.

## 2020-09-23 NOTE — ED Notes (Signed)
Pt advised being unable to void at this time, will monitor.

## 2020-09-23 NOTE — Sepsis Progress Note (Addendum)
Code sepsis protocol being monitored by [facility].  IV rocephin was ordered and given prior to code sepsis protocol orders.
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
## 2020-09-23 NOTE — ED Triage Notes (Signed)
Pt sent over from PCP due to recurrent UTI symptoms and n/v while at office.  PT reports UTI was treated approximately 1 month ago and symptoms subsided.  States symptoms returned approximately 1 week ago.  States vomiting started today while at office.

## 2020-09-24 DIAGNOSIS — I4819 Other persistent atrial fibrillation: Secondary | ICD-10-CM | POA: Diagnosis present

## 2020-09-24 DIAGNOSIS — E860 Dehydration: Secondary | ICD-10-CM | POA: Diagnosis present

## 2020-09-24 DIAGNOSIS — N12 Tubulo-interstitial nephritis, not specified as acute or chronic: Secondary | ICD-10-CM | POA: Diagnosis not present

## 2020-09-24 DIAGNOSIS — R6520 Severe sepsis without septic shock: Secondary | ICD-10-CM

## 2020-09-24 DIAGNOSIS — I252 Old myocardial infarction: Secondary | ICD-10-CM | POA: Diagnosis not present

## 2020-09-24 DIAGNOSIS — A4150 Gram-negative sepsis, unspecified: Secondary | ICD-10-CM | POA: Diagnosis not present

## 2020-09-24 DIAGNOSIS — I10 Essential (primary) hypertension: Secondary | ICD-10-CM | POA: Diagnosis present

## 2020-09-24 DIAGNOSIS — N179 Acute kidney failure, unspecified: Secondary | ICD-10-CM | POA: Diagnosis present

## 2020-09-24 DIAGNOSIS — D649 Anemia, unspecified: Secondary | ICD-10-CM | POA: Diagnosis present

## 2020-09-24 DIAGNOSIS — Z7901 Long term (current) use of anticoagulants: Secondary | ICD-10-CM | POA: Diagnosis not present

## 2020-09-24 DIAGNOSIS — I4891 Unspecified atrial fibrillation: Secondary | ICD-10-CM | POA: Diagnosis not present

## 2020-09-24 DIAGNOSIS — Z79899 Other long term (current) drug therapy: Secondary | ICD-10-CM | POA: Diagnosis not present

## 2020-09-24 DIAGNOSIS — I2510 Atherosclerotic heart disease of native coronary artery without angina pectoris: Secondary | ICD-10-CM | POA: Diagnosis present

## 2020-09-24 DIAGNOSIS — J60 Coalworker's pneumoconiosis: Secondary | ICD-10-CM | POA: Diagnosis present

## 2020-09-24 DIAGNOSIS — N401 Enlarged prostate with lower urinary tract symptoms: Secondary | ICD-10-CM | POA: Diagnosis present

## 2020-09-24 DIAGNOSIS — N111 Chronic obstructive pyelonephritis: Secondary | ICD-10-CM | POA: Diagnosis present

## 2020-09-24 DIAGNOSIS — Z7984 Long term (current) use of oral hypoglycemic drugs: Secondary | ICD-10-CM | POA: Diagnosis not present

## 2020-09-24 DIAGNOSIS — E119 Type 2 diabetes mellitus without complications: Secondary | ICD-10-CM | POA: Diagnosis present

## 2020-09-24 DIAGNOSIS — I959 Hypotension, unspecified: Secondary | ICD-10-CM | POA: Diagnosis present

## 2020-09-24 DIAGNOSIS — D6959 Other secondary thrombocytopenia: Secondary | ICD-10-CM | POA: Diagnosis present

## 2020-09-24 DIAGNOSIS — R509 Fever, unspecified: Secondary | ICD-10-CM | POA: Diagnosis not present

## 2020-09-24 DIAGNOSIS — A4151 Sepsis due to Escherichia coli [E. coli]: Secondary | ICD-10-CM | POA: Diagnosis present

## 2020-09-24 DIAGNOSIS — E669 Obesity, unspecified: Secondary | ICD-10-CM | POA: Diagnosis present

## 2020-09-24 DIAGNOSIS — A419 Sepsis, unspecified organism: Secondary | ICD-10-CM | POA: Diagnosis present

## 2020-09-24 DIAGNOSIS — N390 Urinary tract infection, site not specified: Secondary | ICD-10-CM | POA: Diagnosis present

## 2020-09-24 DIAGNOSIS — Z20822 Contact with and (suspected) exposure to covid-19: Secondary | ICD-10-CM | POA: Diagnosis present

## 2020-09-24 DIAGNOSIS — Z6833 Body mass index (BMI) 33.0-33.9, adult: Secondary | ICD-10-CM | POA: Diagnosis not present

## 2020-09-24 DIAGNOSIS — E872 Acidosis: Secondary | ICD-10-CM | POA: Diagnosis present

## 2020-09-24 DIAGNOSIS — E785 Hyperlipidemia, unspecified: Secondary | ICD-10-CM | POA: Diagnosis present

## 2020-09-24 DIAGNOSIS — G2581 Restless legs syndrome: Secondary | ICD-10-CM | POA: Diagnosis present

## 2020-09-24 DIAGNOSIS — F32A Depression, unspecified: Secondary | ICD-10-CM | POA: Diagnosis present

## 2020-09-24 HISTORY — DX: Gram-negative sepsis, unspecified: N39.0

## 2020-09-24 HISTORY — DX: Gram-negative sepsis, unspecified: A41.50

## 2020-09-24 LAB — COMPREHENSIVE METABOLIC PANEL
ALT: 17 U/L (ref 0–44)
AST: 21 U/L (ref 15–41)
Albumin: 3.3 g/dL — ABNORMAL LOW (ref 3.5–5.0)
Alkaline Phosphatase: 54 U/L (ref 38–126)
Anion gap: 12 (ref 5–15)
BUN: 24 mg/dL — ABNORMAL HIGH (ref 8–23)
CO2: 23 mmol/L (ref 22–32)
Calcium: 8.3 mg/dL — ABNORMAL LOW (ref 8.9–10.3)
Chloride: 102 mmol/L (ref 98–111)
Creatinine, Ser: 1.19 mg/dL (ref 0.61–1.24)
GFR, Estimated: >60 mL/min (ref >60)
Glucose, Bld: 160 mg/dL — ABNORMAL HIGH (ref 70–99)
Potassium: 4.2 mmol/L (ref 3.5–5.1)
Sodium: 137 mmol/L (ref 135–145)
Total Bilirubin: 1 mg/dL (ref 0.3–1.2)
Total Protein: 6.4 g/dL — ABNORMAL LOW (ref 6.5–8.1)

## 2020-09-24 LAB — CBC
HCT: 30.9 % — ABNORMAL LOW (ref 39.0–52.0)
Hemoglobin: 10.4 g/dL — ABNORMAL LOW (ref 13.0–17.0)
MCH: 33.7 pg (ref 26.0–34.0)
MCHC: 33.7 g/dL (ref 30.0–36.0)
MCV: 100 fL (ref 80.0–100.0)
Platelets: 126 10*3/uL — ABNORMAL LOW (ref 150–400)
RBC: 3.09 MIL/uL — ABNORMAL LOW (ref 4.22–5.81)
RDW: 12.8 % (ref 11.5–15.5)
WBC: 9.7 10*3/uL (ref 4.0–10.5)
nRBC: 0 % (ref 0.0–0.2)

## 2020-09-24 LAB — HEMOGLOBIN A1C
Hgb A1c MFr Bld: 6.2 % — ABNORMAL HIGH (ref 4.8–5.6)
Mean Plasma Glucose: 131.24 mg/dL

## 2020-09-24 LAB — GLUCOSE, CAPILLARY
Glucose-Capillary: 150 mg/dL — ABNORMAL HIGH (ref 70–99)
Glucose-Capillary: 175 mg/dL — ABNORMAL HIGH (ref 70–99)

## 2020-09-24 LAB — CBG MONITORING, ED
Glucose-Capillary: 140 mg/dL — ABNORMAL HIGH (ref 70–99)
Glucose-Capillary: 143 mg/dL — ABNORMAL HIGH (ref 70–99)
Glucose-Capillary: 172 mg/dL — ABNORMAL HIGH (ref 70–99)

## 2020-09-24 LAB — PROTIME-INR
INR: 1.9 — ABNORMAL HIGH (ref 0.8–1.2)
Prothrombin Time: 21.5 seconds — ABNORMAL HIGH (ref 11.4–15.2)

## 2020-09-24 LAB — APTT: aPTT: 38 seconds — ABNORMAL HIGH (ref 24–36)

## 2020-09-24 LAB — HIV ANTIBODY (ROUTINE TESTING W REFLEX): HIV Screen 4th Generation wRfx: NONREACTIVE

## 2020-09-24 LAB — LACTIC ACID, PLASMA: Lactic Acid, Venous: 1 mmol/L (ref 0.5–1.9)

## 2020-09-24 MED ORDER — SODIUM CHLORIDE 0.9 % IV SOLN: INTRAVENOUS | Status: AC

## 2020-09-24 MED ORDER — SODIUM CHLORIDE 0.9 % IV BOLUS
500.0000 mL | Freq: Once | INTRAVENOUS | Status: AC
  Administered 2020-09-24: 500 mL via INTRAVENOUS

## 2020-09-24 MED ORDER — METOPROLOL TARTRATE 25 MG PO TABS
12.5000 mg | ORAL_TABLET | Freq: Four times a day (QID) | ORAL | Status: DC
  Administered 2020-09-24 (×3): 12.5 mg via ORAL
  Filled 2020-09-24 (×3): qty 1

## 2020-09-24 NOTE — TOC Initial Note (Signed)
Transition of Care [facility]) - Initial/Assessment Note    Patient Details  Name: Travis Howell MRN: [id number] Date of Birth: 28-Feb-1950  Transition [facility]) CM/SW Contact:    Dessa Phi, RN Phone Number: 09/24/2020, 3:55 PM  Clinical Narrative:  Damaris Schooner to s/o [name] about d/c plans-from home,has pcp,pharmacy,own transport.                 Expected Discharge Plan: Home/Self Care Barriers to Discharge: Continued Medical Work up   Patient Goals and CMS Choice Patient states their goals for this hospitalization and ongoing recovery are:: go home CMS Medicare.gov Compare Post Acute Care list provided to:: Patient Represenative (must comment) (s/o [name] [phone])    Expected Discharge Plan and Services Expected Discharge Plan: Home/Self Care   Discharge Planning Services: CM Consult   Living arrangements for the past 2 months: Single Family Home                                      Prior Living Arrangements/Services Living arrangements for the past 2 months: Single Family Home Lives with:: Significant Other Patient language and need for interpreter reviewed:: Yes Do you feel safe going back to the place where you live?: Yes      Need for Family Participation in Patient Care: No (Comment) Care giver support system in place?: Yes (comment)   Criminal Activity/Legal Involvement Pertinent to Current Situation/Hospitalization: No - Comment as needed  Activities of Daily Living Home Assistive Devices/Equipment: None ADL Screening (condition at time of admission) Patient's cognitive ability adequate to safely complete daily activities?: Yes Is the patient deaf or have difficulty hearing?: No Does the patient have difficulty seeing, even when wearing glasses/contacts?: No Does the patient have difficulty concentrating, remembering, or making decisions?: No Patient able to express need for assistance with ADLs?: Yes Does the patient have difficulty dressing  or bathing?: No Independently performs ADLs?: Yes (appropriate for developmental age) Does the patient have difficulty walking or climbing stairs?: No Weakness of Legs: Both Weakness of Arms/Hands: Both  Permission Sought/Granted Permission sought to share information with : Case Manager Permission granted to share information with : Yes, Verbal Permission Granted  Share Information with NAME: Case Manager           Emotional Assessment Appearance:: Appears stated age Attitude/Demeanor/Rapport: Gracious Affect (typically observed): Accepting Orientation: : Oriented to Self, Oriented to Place, Oriented to  Time, Oriented to Situation Alcohol / Substance Use: Not Applicable Psych Involvement: No (comment)  Admission diagnosis:  Sepsis ([place]) [A41.9] Severe sepsis ([place]) [A41.9, R65.20] Sepsis due to gram-negative UTI ([place]) [A41.50, N39.0] Patient Active Problem List   Diagnosis Date Noted   Sepsis due to gram-negative UTI ([place]) 09/24/2020   AKI (acute kidney injury) ([place]) 09/24/2020   Hypotension 09/24/2020   Sepsis ([place]) 09/23/2020   Bilateral primary osteoarthritis of knee 07/29/2020   Podagra 07/29/2020   Irregular heart rate 07/29/2020   Atrial fibrillation with RVR ([place]) 07/29/2020   Pre-op evaluation 05/12/2019   Acute prostatitis 04/17/2019   Elevated hemidiaphragm 10/25/2018   Prostate nodule 10/19/2018   Lung disease 10/13/2017   Hypertensive heart disease 02/18/2017   Controlled diabetes mellitus type 2 with complications ([place]) 18/29/9371   Medicare annual wellness visit, subsequent 10/09/2016   Deviated septum 09/25/2016   Nasal turbinate hypertrophy 09/25/2016   Advanced care planning/counseling discussion 10/07/2015   Status post total shoulder arthroplasty  10/03/2015   History of MI (myocardial infarction) 08/19/2015   Chronic lower back pain    RLS (restless legs syndrome)    Obesity, Class I, BMI 30-34.9 06/18/2015   Depression with anxiety    CAD  (coronary artery disease)    HTN (hypertension)    Hyperlipidemia associated with type 2 diabetes mellitus (HCC)    BPH (benign prostatic hyperplasia)    Arthritis of left shoulder region 11/24/2010   Adhesive capsulitis of shoulder 11/23/2010   PCP:  [name], MD Pharmacy:   [id number] - [place], [place] - [address] [place] [address] Phone: [phone] Fax: [phone]     Social Determinants of Health (SDOH) Interventions    Readmission Risk Interventions No flowsheet data found.

## 2020-09-24 NOTE — Plan of Care (Signed)
  Problem: Education: Goal: Knowledge of General Education information will improve Description: Including pain rating scale, medication(s)/side effects and non-pharmacologic comfort measures Outcome: Progressing   Problem: Education: Goal: Knowledge of General Education information will improve Description: Including pain rating scale, medication(s)/side effects and non-pharmacologic comfort measures Outcome: Progressing   Problem: Health Behavior/Discharge Planning: Goal: Ability to manage health-related needs will improve Outcome: Progressing   Problem: Clinical Measurements: Goal: Ability to maintain clinical measurements within normal limits will improve Outcome: Progressing Goal: Diagnostic test results will improve Outcome: Progressing Goal: Cardiovascular complication will be avoided Outcome: Progressing   Problem: Nutrition: Goal: Adequate nutrition will be maintained Outcome: Progressing   Problem: Elimination: Goal: Will not experience complications related to urinary retention Outcome: Progressing   Problem: Safety: Goal: Ability to remain free from injury will improve Outcome: Progressing

## 2020-09-24 NOTE — Progress Notes (Signed)
PROGRESS NOTE   Travis Howell  WUX:[id number]    DOB: [birth year]    DOA: 09/23/2020  PCP: [name], MD   I have briefly reviewed patients previous [facility] in [facility].  Chief Complaint  Patient presents with   Dysuria   Emesis    Brief Narrative:  70 year old male with medical history significant for BPH (follows with Dr. [name], urology), CAD s/p prior MI and stent, hypertension, type II DM, hyperlipidemia, A. fib on Eliquis, UTIs, presented with complaints of nausea, vomiting and feeling weak.  He also reported 4 days history of hematuria, difficulty urinating and some dysuria.  He was seen by Dr. [name] in the office, was feeling poorly, about to pass out, hypotensive, nausea and vomiting and hence was directed to the ED.  Admitted for sepsis due to UTI, A. fib with RVR and acute kidney injury.  Course complicated by hypotension.  Cardiology consulted.   Assessment & Plan:  Active Problems:   Sepsis ([place])   Sepsis due to gram-negative UTI (HCC)   Severe sepsis (PO[facility]) due to presumed UTI/recurrent UTI-patient met criteria for sepsis with intermittent hypotension, tachycardia, tachypnea and a source.  Urine microscopy consistent with UTI.  Urine culture pending.  Blood cultures x2: Negative to date.  E. coli UTI in June 2021, pansensitive except to levofloxacin. -Continue fluids, continue ceftriaxone at 2 g while monitoring blood cultures -Bladder scan to ensure no retention   Acute kidney injury-in the setting of sepsis, intermittent obstructive symptoms at home.  Continue fluids, bladder scan as above, monitor renal function.  Hold valsartan.  Improving.  Continue IV fluids for additional 24 hours and follow BMP in a.m.  A. fib with RVR-driven by #1, continue fluids, antibiotics.  Ongoing A. fib with RVR mostly in the 110s-120s, asymptomatic however.  Per chart review, complicated A. fib, failed cardioversion, not a candidate for flecainide due to  CAD, amiodarone with long-term side effects.  Has upcoming EP cardiology appointment to consider ablation.  Unable to titrate meds i.e. beta-blockers or start diltiazem due to hypotension.  Cardiology thereby consulted and may have to start amiodarone or digoxin etc.  Cardiology assistance appreciated.  Continue apixaban.  Hypotension: Likely multifactorial, dehydration, sepsis, A. fib with RVR.  Soft blood pressures.  Bolused with 500 mL IV normal saline and continue maintenance IV fluids at 125 mL/h.  Monitor closely.  Lactic acidosis: Secondary to sepsis and dehydration.  Resolved.  Hyperlipidemia-continue home medications   Essential hypertension-hold amlodipine/valsartan due to hypotension.  Beta-blockers also to be used with holding parameters.  BPH-continue home medications.  Hematuria could be due to cystitis.  Seems to be clearing up and mild.  Denies any recent procedures or catheterization.  Outpatient follow-up with Dr. [name].   Type 2 diabetes mellitus-hold metformin, placed on sliding scale.  Labile and mildly uncontrolled.  Acute anemia: Hemoglobin dropped from his baseline of 12-13 g to 10.4.  Do not think that this is due to the hematuria.  Suspect dilutional and acute illness.  Follow CBC daily.  Thrombocytopenia: Secondary to sepsis.  Follow CBC.  Body mass index is 32.23 kg/m.   DVT prophylaxis:   On full dose apixaban   Code Status: Full Code Family Communication: None at bedside Disposition:  Status is: Inpatient  Remains inpatient appropriate because:Hemodynamically unstable, IV treatments appropriate due to intensity of illness or inability to take PO, and Inpatient level of care appropriate due to severity of illness  Dispo: The patient is from: Home  Anticipated d/c is to: Home              Patient currently is not medically stable to d/c.   Difficult to place patient No        Consultants:   Cardiology  Procedures:    None  Antimicrobials:    Anti-infectives (From admission, onward)    Start     Dose/Rate Route Frequency Ordered Stop   09/24/20 1500  cefTRIAXone (ROCEPHIN) 2 g in sodium chloride 0.9 % 100 mL IVPB        2 g 200 mL/hr over 30 Minutes Intravenous Every 24 hours 09/23/20 1652     09/23/20 1530  cefTRIAXone (ROCEPHIN) 2 g in sodium chloride 0.9 % 100 mL IVPB        2 g 200 mL/hr over 30 Minutes Intravenous  Once 09/23/20 1523 09/23/20 1614         Subjective:  Seen this morning in the ED.  States that his dysuria and blood in the urine are better.  Denies dyspnea, chest pain, dizziness or lightheadedness.  Objective:   Vitals:   09/24/20 0730 09/24/20 0845 09/24/20 0930 09/24/20 1030  BP: 106/69  98/65 (!) 95/56  Pulse: (!) 127 97 (!) 105 (!) 111  Resp: (!) '25 20 20 ' (!) 23  Temp:      TempSrc:      SpO2: 97% 92% 94% 93%  Weight:      Height:        General exam: Middle-age male, lying comfortably propped up in bed without distress. Respiratory system: Clear to auscultation except an occasional audible wheeze. Respiratory effort normal. Cardiovascular system: S1 & S2 heard, irregularly irregular and tachycardic. No JVD, murmurs, rubs, gallops or clicks. No pedal edema.  Telemetry personally reviewed: A. fib with RVR mostly in the 110s-sometimes in the 120s. Gastrointestinal system: Abdomen is nondistended, soft and nontender. No organomegaly or masses felt. Normal bowel sounds heard. GU: Slightly blood-tinged urine in urinal at bedside. Central nervous system: Alert and oriented. No focal neurological deficits. Extremities: Symmetric 5 x 5 power. Skin: No rashes, lesions or ulcers Psychiatry: Judgement and insight appear normal. Mood & affect appropriate.     Data Reviewed:   I have personally reviewed following labs and imaging studies   CBC: Recent Labs  Lab 09/23/20 1416 09/24/20 0432  WBC 9.3 9.7  NEUTROABS 8.1*  --   HGB 13.3 10.4*  HCT 38.8* 30.9*   MCV 99.0 100.0  PLT 165 126*    Basic Metabolic Panel: Recent Labs  Lab 09/23/20 1416 09/24/20 0432  NA 138 137  K 4.4 4.2  CL 98 102  CO2 26 23  GLUCOSE 229* 160*  BUN 27* 24*  CREATININE 1.44* 1.19  CALCIUM 9.3 8.3*    Liver Function Tests: Recent Labs  Lab 09/23/20 1416 09/24/20 0432  AST 21 21  ALT 18 17  ALKPHOS 62 54  BILITOT 1.4* 1.0  PROT 7.8 6.4*  ALBUMIN 4.1 3.3*    CBG: Recent Labs  Lab 09/24/20 0833 09/24/20 0850  GLUCAP 140* 143*    Microbiology Studies:   Recent Results (from the past 240 hour(s))  Resp Panel by RT-PCR (Flu A&B, Covid) Nasopharyngeal Swab     Status: None   Collection Time: 09/23/20  3:41 PM   Specimen: Nasopharyngeal Swab; Nasopharyngeal(NP) swabs in vial transport medium  Result Value Ref Range Status   SARS Coronavirus 2 by RT PCR NEGATIVE NEGATIVE Final    Comment: (NOTE)  SARS-CoV-2 target nucleic acids are NOT DETECTED.  The SARS-CoV-2 RNA is generally detectable in upper respiratory specimens during the acute phase of infection. The lowest concentration of SARS-CoV-2 viral copies this assay can detect is 138 copies/mL. A negative result does not preclude SARS-Cov-2 infection and should not be used as the sole basis for treatment or other patient management decisions. A negative result may occur with  improper specimen collection/handling, submission of specimen other than nasopharyngeal swab, presence of viral mutation(s) within the areas targeted by this assay, and inadequate number of viral copies(<138 copies/mL). A negative result must be combined with clinical observations, patient history, and epidemiological information. The expected result is Negative.  Fact Sheet for Patients:  EntrepreneurPulse.com.au  Fact Sheet for Healthcare Providers:  IncredibleEmployment.be  This test is no t yet approved or cleared by the [place] FDA and  has been authorized for  detection and/or diagnosis of SARS-CoV-2 by FDA under an Emergency Use Authorization (EUA). This EUA will remain  in effect (meaning this test can be used) for the duration of the COVID-19 declaration under Section 564(b)(1) of the Act, 21 U.S.C.section 360bbb-3(b)(1), unless the authorization is terminated  or revoked sooner.       Influenza A by PCR NEGATIVE NEGATIVE Final   Influenza B by PCR NEGATIVE NEGATIVE Final    Comment: (NOTE) The Xpert Xpress SARS-CoV-2/FLU/RSV plus assay is intended as an aid in the diagnosis of influenza from Nasopharyngeal swab specimens and should not be used as a sole basis for treatment. Nasal washings and aspirates are unacceptable for Xpert Xpress SARS-CoV-2/FLU/RSV testing.  Fact Sheet for Patients: EntrepreneurPulse.com.au  Fact Sheet for Healthcare Providers: IncredibleEmployment.be  This test is not yet approved or cleared by the [place] FDA and has been authorized for detection and/or diagnosis of SARS-CoV-2 by FDA under an Emergency Use Authorization (EUA). This EUA will remain in effect (meaning this test can be used) for the duration of the COVID-19 declaration under Section 564(b)(1) of the Act, 21 U.S.C. section 360bbb-3(b)(1), unless the authorization is terminated or revoked.  Performed at [facility], [place] [address] [place], [place] [address]   Blood Culture (routine x 2)     Status: None (Preliminary result)   Collection Time: 09/23/20  3:41 PM   Specimen: BLOOD  Result Value Ref Range Status   Specimen Description   Final    BLOOD LEFT ANTECUBITAL Performed at [facility] [address], [place], [place] [address]    Special Requests   Final    BOTTLES DRAWN AEROBIC AND ANAEROBIC Blood Culture results may not be optimal due to an excessive volume of blood received in culture bottles Performed at [facility]  [address], [place], [place] [address]    Culture   Final    NO GROWTH < 12 HOURS Performed at [facility] [address], [place], [place] [address]    Report Status PENDING  Incomplete  Blood Culture (routine x 2)     Status: None (Preliminary result)   Collection Time: 09/23/20  5:41 PM   Specimen: BLOOD  Result Value Ref Range Status   Specimen Description   Final    BLOOD RIGHT ANTECUBITAL Performed at [facility] [address], [place], [place] [address]    Special Requests   Final    BOTTLES DRAWN AEROBIC AND ANAEROBIC Blood Culture results may not be optimal due to an excessive volume of blood received  in culture bottles Performed at [facility], [place] [address], [place], [place] [address]    Culture   Final    NO GROWTH < 12 HOURS Performed at [facility] [address], [place], [place] [address]    Report Status PENDING  Incomplete     Radiology Studies:  DG Chest 2 View  Result Date: 09/23/2020 CLINICAL DATA:  hypotension, fever, EXAM: CHEST - 2 VIEW COMPARISON:  None. FINDINGS: Elevation of the right hemidiaphragm. The cardiomediastinal silhouette is within normal limits. No pleural effusion. No pneumothorax. No mass or consolidation. Bilateral shoulder arthroplasty. IMPRESSION: Elevation of the right hemidiaphragm without other acute focal process identified in the chest. Electronically Signed   By: Albin Felling M.D.   On: 09/23/2020 15:07     Scheduled Meds:    apixaban  5 mg Oral BID   aspirin  81 mg Oral Daily   ezetimibe  10 mg Oral Daily   insulin aspart  0-9 Units Subcutaneous TID WC   metoprolol tartrate  12.5 mg Oral QID   rosuvastatin  5 mg Oral Daily   tamsulosin  0.4 mg Oral QPC supper    Continuous Infusions:    sodium chloride 125 mL/hr at 09/24/20 0747   cefTRIAXone (ROCEPHIN)  IV       LOS: 0 days     Vernell Leep, MD, [place], [facility]. [facility]    To contact the  attending provider between 7A-7P or the covering provider during after hours 7P-7A, please log into the web site [email or web address] and access using universal [place] password for that web site. If you do not have the password, please call the hospital operator.  09/24/2020, 11:33 AM

## 2020-09-24 NOTE — Progress Notes (Signed)
Patient in Red MEWs due to fever and elevated heart rate. Patient stable, denies any distress, already being treated for sepsis D/T UTI, charge nurse, rapid response and Dr. Algis Liming made aware, will continue to monitor patient and F/U with plan of care.   09/24/20 1607  Assess: MEWS Score  Temp (!) 102.7 F (39.3 C)  BP 113/76  Pulse Rate (!) 114  Resp 16  Level of Consciousness Alert  SpO2 91 %  O2 Device Room Air  Assess: MEWS Score  MEWS Temp 2  MEWS Systolic 0  MEWS Pulse 2  MEWS RR 0  MEWS LOC 0  MEWS Score 4  MEWS Score Color Red  Assess: if the MEWS score is Yellow or Red  Were vital signs taken at a resting state? Yes  Focused Assessment Change from prior assessment (see assessment flowsheet) (he was Red MEWs in the ED)  Does the patient meet 2 or more of the SIRS criteria? Yes  Does the patient have a confirmed or suspected source of infection? Yes  Assess: SIRS CRITERIA  SIRS Temperature  1  SIRS Pulse 1  SIRS Respirations  0  SIRS WBC 0  SIRS Score Sum  2
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
## 2020-09-25 DIAGNOSIS — R6520 Severe sepsis without septic shock: Secondary | ICD-10-CM | POA: Diagnosis not present

## 2020-09-25 DIAGNOSIS — N390 Urinary tract infection, site not specified: Secondary | ICD-10-CM | POA: Diagnosis not present

## 2020-09-25 DIAGNOSIS — A419 Sepsis, unspecified organism: Secondary | ICD-10-CM | POA: Diagnosis not present

## 2020-09-25 DIAGNOSIS — N179 Acute kidney failure, unspecified: Secondary | ICD-10-CM | POA: Diagnosis not present

## 2020-09-25 DIAGNOSIS — I4891 Unspecified atrial fibrillation: Secondary | ICD-10-CM | POA: Diagnosis not present

## 2020-09-25 DIAGNOSIS — A4150 Gram-negative sepsis, unspecified: Secondary | ICD-10-CM | POA: Diagnosis not present

## 2020-09-25 LAB — CBC
HCT: 29.9 % — ABNORMAL LOW (ref 39.0–52.0)
Hemoglobin: 10.5 g/dL — ABNORMAL LOW (ref 13.0–17.0)
MCH: 34.2 pg — ABNORMAL HIGH (ref 26.0–34.0)
MCHC: 35.1 g/dL (ref 30.0–36.0)
MCV: 97.4 fL (ref 80.0–100.0)
Platelets: 134 10*3/uL — ABNORMAL LOW (ref 150–400)
RBC: 3.07 MIL/uL — ABNORMAL LOW (ref 4.22–5.81)
RDW: 12.6 % (ref 11.5–15.5)
WBC: 7.1 10*3/uL (ref 4.0–10.5)
nRBC: 0 % (ref 0.0–0.2)

## 2020-09-25 LAB — BASIC METABOLIC PANEL
Anion gap: 10 (ref 5–15)
BUN: 23 mg/dL (ref 8–23)
CO2: 23 mmol/L (ref 22–32)
Calcium: 8.4 mg/dL — ABNORMAL LOW (ref 8.9–10.3)
Chloride: 103 mmol/L (ref 98–111)
Creatinine, Ser: 0.89 mg/dL (ref 0.61–1.24)
GFR, Estimated: >60 mL/min (ref >60)
Glucose, Bld: 169 mg/dL — ABNORMAL HIGH (ref 70–99)
Potassium: 4.1 mmol/L (ref 3.5–5.1)
Sodium: 136 mmol/L (ref 135–145)

## 2020-09-25 LAB — GLUCOSE, CAPILLARY
Glucose-Capillary: 139 mg/dL — ABNORMAL HIGH (ref 70–99)
Glucose-Capillary: 162 mg/dL — ABNORMAL HIGH (ref 70–99)
Glucose-Capillary: 127 mg/dL — ABNORMAL HIGH (ref 70–99)

## 2020-09-25 LAB — URINE CULTURE: Culture: >=100000 — AB

## 2020-09-25 MED ORDER — CEFADROXIL 500 MG PO CAPS
1000.0000 mg | ORAL_CAPSULE | Freq: Two times a day (BID) | ORAL | Status: DC
  Administered 2020-09-26: 1000 mg via ORAL
  Filled 2020-09-25 (×2): qty 2

## 2020-09-25 MED ORDER — METOPROLOL TARTRATE 25 MG PO TABS
25.0000 mg | ORAL_TABLET | Freq: Four times a day (QID) | ORAL | Status: DC
  Administered 2020-09-25 – 2020-09-27 (×10): 25 mg via ORAL
  Filled 2020-09-25 (×10): qty 1

## 2020-09-25 NOTE — Progress Notes (Signed)
Patient feeling better. Albuterol inh effective. Resting quietly in bed, no acute distress. Will continue to monitor.

## 2020-09-25 NOTE — Progress Notes (Signed)
Received a call from telemetry with reports of patient HR 170 non-sustained. Nurse entered room found patient awake in bed shivering. He is afebrile 99.1. Noted expiratory wheezing; denies SOB or dyspnea. O2 Sats 91% RA. Patient given 2 puffs of albuterol inhaler and placed on 2L [place]. Will continue to monitor.

## 2020-09-25 NOTE — Progress Notes (Signed)
PROGRESS NOTE   Travis Howell  JOA:[id number]    DOB: June 11, 1950    DOA: 09/23/2020  PCP: [name], MD   I have briefly reviewed patients previous medical records in [facility].  Chief Complaint  Patient presents with   Dysuria   Emesis    Brief Narrative:  70 year old male with medical history significant for BPH (follows with Dr. [name], urology), CAD s/p prior MI and stent, hypertension, type II DM, hyperlipidemia, A. fib on Eliquis, UTIs, presented with complaints of nausea, vomiting and feeling weak.  He also reported 4 days history of hematuria, difficulty urinating and some dysuria.  He was seen by Dr. [name] in the office, was feeling poorly, about to pass out, hypotensive, nausea and vomiting and hence was directed to the ED.  Admitted for sepsis due to UTI, A. fib with RVR and acute kidney injury.  Course complicated by hypotension.  Cardiology consulted.   Assessment & Plan:  Active Problems:   Atrial fibrillation with RVR (HCC)   Sepsis (HCC)   Sepsis due to gram-negative UTI (HCC)   AKI (acute kidney injury) ([place])   Hypotension   Severe sepsis (POA) due to E. coli UTI/recurrent UTI-patient met criteria for sepsis with intermittent hypotension, tachycardia, tachypnea and a source.  Urine microscopy consistent with UTI.  Urine culture: Pansensitive E. coli.  Blood cultures x2: Negative to date.  E. coli UTI in June 2021, pansensitive except to levofloxacin. -Spiked temperature of 102.7 on 9/20 afternoon.  Since then has defervesced.  Completed 3 days of IV ceftriaxone.  Monitor overnight to oral antibiotics and complete 5-7 days course.  Sepsis resolved.   Acute kidney injury-in the setting of sepsis, intermittent obstructive symptoms at home.  Continue fluids, bladder scan as above, monitor renal function.  Hold valsartan.  Resolved after IV fluids.  A. fib with RVR-driven by #1, continue fluids, antibiotics.  Ongoing A. fib with RVR mostly in the  110s-120s, asymptomatic however.  Per chart review, complicated A. fib, failed cardioversion, not a candidate for flecainide due to CAD, amiodarone with long-term side effects.  Has upcoming EP cardiology appointment to consider ablation.  Unable to titrate meds i.e. beta-blockers or start diltiazem due to hypotension.  Cardiology thereby consulted, adjusted metoprolol 12.5 Mg 4 times daily.  Still with mild RVR.  Metoprolol increased to 25 Mg 4 times daily.  Hypotension: Likely multifactorial, dehydration, sepsis, A. fib with RVR.  Resolved after IV fluids.  Lactic acidosis: Secondary to sepsis and dehydration.  Resolved.  Hyperlipidemia-continue home medications   Essential hypertension-hold amlodipine/valsartan due to hypotension.  Beta-blockers also to be used with holding parameters.  BPH-continue home medications.  Hematuria could be due to cystitis.  Seems to be clearing up and mild.  Denies any recent procedures or catheterization.  Outpatient follow-up with Dr. [name].   Type 2 diabetes mellitus-hold metformin, placed on sliding scale.  Labile and mildly uncontrolled.  Acute anemia: Hemoglobin dropped from his baseline of 12-13 g to 10.4.  Do not think that this is due to the hematuria.  Suspect dilutional and acute illness.  Stable.  Thrombocytopenia: Secondary to sepsis.  Follow CBC.  Body mass index is 33.55 kg/m.   DVT prophylaxis:   On full dose apixaban   Code Status: Full Code Family Communication: Discussed with patient's son at bedside. Disposition:  Status is: Inpatient  Remains inpatient appropriate because:Hemodynamically unstable, IV treatments appropriate due to intensity of illness or inability to take PO, and Inpatient level of  care appropriate due to severity of illness  Dispo: The patient is from: Home              Anticipated d/c is to: Home possibly 9/22              Patient currently is not medically stable to d/c.   Difficult to place patient  No        Consultants:   Cardiology  Procedures:   None  Antimicrobials:    Anti-infectives (From admission, onward)    Start     Dose/Rate Route Frequency Ordered Stop   09/26/20 1000  cefadroxil (DURICEF) capsule 1,000 mg        1,000 mg Oral 2 times daily 09/25/20 1530     09/24/20 1500  cefTRIAXone (ROCEPHIN) 2 g in sodium chloride 0.9 % 100 mL IVPB        2 g 200 mL/hr over 30 Minutes Intravenous Every 24 hours 09/23/20 1652 09/25/20 1539   09/23/20 1530  cefTRIAXone (ROCEPHIN) 2 g in sodium chloride 0.9 % 100 mL IVPB        2 g 200 mL/hr over 30 Minutes Intravenous  Once 09/23/20 1523 09/23/20 1614         Subjective:  Had some chills overnight and palpitations which have resolved without recurrence.  Denies any other complaints.  Objective:   Vitals:   09/25/20 0027 09/25/20 0553 09/25/20 0802 09/25/20 1213  BP: (!) 148/93 107/70 116/82 120/83  Pulse: (!) 128  (!) 114 99  Resp: 20  (!) 22 (!) 22  Temp: 99.2 F (37.3 C) 99.5 F (37.5 C) 98.4 F (36.9 C) 98.1 F (36.7 C)  TempSrc: Oral Oral Oral Oral  SpO2: 92% 94% 96% 93%  Weight:      Height:        General exam: Middle-age male, lying comfortably propped up in bed without distress.  Does not appear septic or toxic. Respiratory system: Clear to auscultation.  No increased work of breathing. Cardiovascular system: S1 and S2 heard, irregularly irregular And mild tachycardic.  No JVD or murmurs or pedal edema.  Telemetry reviewed: A. fib with RVR mostly in the 100s-120s.  Occasionally in 130's Gastrointestinal system: Abdomen is nondistended, soft and nontender. No organomegaly or masses felt. Normal bowel sounds heard. GU: Slightly blood-tinged urine in urinal at bedside. Central nervous system: Alert and oriented. No focal neurological deficits. Extremities: Symmetric 5 x 5 power. Skin: No rashes, lesions or ulcers Psychiatry: Judgement and insight appear normal. Mood & affect appropriate.      Data Reviewed:   I have personally reviewed following labs and imaging studies   CBC: Recent Labs  Lab 09/23/20 1416 09/24/20 0432 09/25/20 0422  WBC 9.3 9.7 7.1  NEUTROABS 8.1*  --   --   HGB 13.3 10.4* 10.5*  HCT 38.8* 30.9* 29.9*  MCV 99.0 100.0 97.4  PLT 165 126* 134*    Basic Metabolic Panel: Recent Labs  Lab 09/23/20 1416 09/24/20 0432 09/25/20 0422  NA 138 137 136  K 4.4 4.2 4.1  CL 98 102 103  CO2 '26 23 23  ' GLUCOSE 229* 160* 169*  BUN 27* 24* 23  CREATININE 1.44* 1.19 0.89  CALCIUM 9.3 8.3* 8.4*    Liver Function Tests: Recent Labs  Lab 09/23/20 1416 09/24/20 0432  AST 21 21  ALT 18 17  ALKPHOS 62 54  BILITOT 1.4* 1.0  PROT 7.8 6.4*  ALBUMIN 4.1 3.3*    CBG: Recent Labs  Lab 09/24/20 2046 09/25/20 0738 09/25/20 1115  GLUCAP 175* 139* 162*    Microbiology Studies:   Recent Results (from the past 240 hour(s))  Resp Panel by RT-PCR (Flu A&B, Covid) Nasopharyngeal Swab     Status: None   Collection Time: 09/23/20  3:41 PM   Specimen: Nasopharyngeal Swab; Nasopharyngeal(NP) swabs in vial transport medium  Result Value Ref Range Status   SARS Coronavirus 2 by RT PCR NEGATIVE NEGATIVE Final    Comment: (NOTE) SARS-CoV-2 target nucleic acids are NOT DETECTED.  The SARS-CoV-2 RNA is generally detectable in upper respiratory specimens during the acute phase of infection. The lowest concentration of SARS-CoV-2 viral copies this assay can detect is 138 copies/mL. A negative result does not preclude SARS-Cov-2 infection and should not be used as the sole basis for treatment or other patient management decisions. A negative result may occur with  improper specimen collection/handling, submission of specimen other than nasopharyngeal swab, presence of viral mutation(s) within the areas targeted by this assay, and inadequate number of viral copies(<138 copies/mL). A negative result must be combined with clinical observations, patient  history, and epidemiological information. The expected result is Negative.  Fact Sheet for Patients:  EntrepreneurPulse.com.au  Fact Sheet for Healthcare Providers:  IncredibleEmployment.be  This test is no t yet approved or cleared by the [place] FDA and  has been authorized for detection and/or diagnosis of SARS-CoV-2 by FDA under an Emergency Use Authorization (EUA). This EUA will remain  in effect (meaning this test can be used) for the duration of the COVID-19 declaration under Section 564(b)(1) of the Act, 21 U.S.C.section 360bbb-3(b)(1), unless the authorization is terminated  or revoked sooner.       Influenza A by PCR NEGATIVE NEGATIVE Final   Influenza B by PCR NEGATIVE NEGATIVE Final    Comment: (NOTE) The Xpert Xpress SARS-CoV-2/FLU/RSV plus assay is intended as an aid in the diagnosis of influenza from Nasopharyngeal swab specimens and should not be used as a sole basis for treatment. Nasal washings and aspirates are unacceptable for Xpert Xpress SARS-CoV-2/FLU/RSV testing.  Fact Sheet for Patients: EntrepreneurPulse.com.au  Fact Sheet for Healthcare Providers: IncredibleEmployment.be  This test is not yet approved or cleared by the [place] FDA and has been authorized for detection and/or diagnosis of SARS-CoV-2 by FDA under an Emergency Use Authorization (EUA). This EUA will remain in effect (meaning this test can be used) for the duration of the COVID-19 declaration under Section 564(b)(1) of the Act, 21 U.S.C. section 360bbb-3(b)(1), unless the authorization is terminated or revoked.  Performed at [facility], [place] [address], [place], [place] [address]   Blood Culture (routine x 2)     Status: None (Preliminary result)   Collection Time: 09/23/20  3:41 PM   Specimen: BLOOD  Result Value Ref Range Status   Specimen Description   Final    BLOOD LEFT  ANTECUBITAL Performed at [facility] [address], [place], [place] [address]    Special Requests   Final    BOTTLES DRAWN AEROBIC AND ANAEROBIC Blood Culture results may not be optimal due to an excessive volume of blood received in culture bottles Performed at [facility] [address], [place], [place] [address]    Culture   Final    NO GROWTH 2 DAYS Performed at [facility] [address], [place], [place] [address]    Report Status PENDING  Incomplete  Urine Culture  Status: Abnormal   Collection Time: 09/23/20  3:59 PM   Specimen: Urine, Clean Catch  Result Value Ref Range Status   Specimen Description   Final    URINE, CLEAN CATCH Performed at [facility], [place] [address] [place], [place] [address]    Special Requests   Final    NONE Performed at [facility], [place] [address], [place], [place] [address]    Culture >=100,000 COLONIES/mL ESCHERICHIA COLI (A)  Final   Report Status 09/25/2020 FINAL  Final   Organism ID, Bacteria ESCHERICHIA COLI (A)  Final      Susceptibility   Escherichia coli - MIC*    AMPICILLIN 8 SENSITIVE Sensitive     CEFAZOLIN <=4 SENSITIVE Sensitive     CEFEPIME <=0.12 SENSITIVE Sensitive     CEFTRIAXONE <=0.25 SENSITIVE Sensitive     CIPROFLOXACIN 0.5 SENSITIVE Sensitive     GENTAMICIN <=1 SENSITIVE Sensitive     IMIPENEM <=0.25 SENSITIVE Sensitive     NITROFURANTOIN <=16 SENSITIVE Sensitive     TRIMETH/SULFA <=20 SENSITIVE Sensitive     AMPICILLIN/SULBACTAM 4 SENSITIVE Sensitive     PIP/TAZO <=4 SENSITIVE Sensitive     * >=100,000 COLONIES/mL ESCHERICHIA COLI  Blood Culture (routine x 2)     Status: None (Preliminary result)   Collection Time: 09/23/20  5:41 PM   Specimen: BLOOD  Result Value Ref Range Status   Specimen Description   Final    BLOOD RIGHT ANTECUBITAL Performed at [facility] [address], [place], [place]  [address]    Special Requests   Final    BOTTLES DRAWN AEROBIC AND ANAEROBIC Blood Culture results may not be optimal due to an excessive volume of blood received in culture bottles Performed at [facility] [address] [place], [place] [address]    Culture   Final    NO GROWTH 2 DAYS Performed at [facility] [address], [place], [place] [address]    Report Status PENDING  Incomplete     Radiology Studies:  No results found.   Scheduled Meds:    apixaban  5 mg Oral BID   aspirin  81 mg Oral Daily   [START ON 09/26/2020] cefadroxil  1,000 mg Oral BID   ezetimibe  10 mg Oral Daily   insulin aspart  0-9 Units Subcutaneous TID WC   metoprolol tartrate  25 mg Oral QID   rosuvastatin  5 mg Oral Daily   tamsulosin  0.4 mg Oral QPC supper    Continuous Infusions:       LOS: 1 day     Vernell Leep, MD, FACP, [facility]. [facility]    To contact the attending provider between 7A-7P or the covering provider during after hours 7P-7A, please log into the web site [email or web address] and access using universal [place] password for that web site. If you do not have the password, please call the hospital operator.  09/25/2020, 4:01 PM

## 2020-09-25 NOTE — Progress Notes (Addendum)
Progress Note  Patient Name: Travis Howell Date of Encounter: 09/25/2020  [facility] Cardiologist: Larae Grooms, MD   Subjective   Felt some heart racing overnight but improved today. Also with some wheezing earlier which resolved with home prn albuterol inhaler. No complaints of chest pain or SOB this morning.   Inpatient Medications    Scheduled Meds:  apixaban  5 mg Oral BID   aspirin  81 mg Oral Daily   ezetimibe  10 mg Oral Daily   insulin aspart  0-9 Units Subcutaneous TID WC   metoprolol tartrate  12.5 mg Oral QID   rosuvastatin  5 mg Oral Daily   tamsulosin  0.4 mg Oral QPC supper   Continuous Infusions:  cefTRIAXone (ROCEPHIN)  IV 2 g (09/24/20 1514)   PRN Meds: acetaminophen **OR** acetaminophen, albuterol, senna-docusate   Vital Signs    Vitals:   09/24/20 1845 09/24/20 2046 09/25/20 0027 09/25/20 0553  BP: (!) 109/59 108/70 (!) 148/93 107/70  Pulse: (!) 109 (!) 108 (!) 128   Resp: 20 18 20    Temp: (!) 100.5 F (38.1 C) 98.1 F (36.7 C) 99.2 F (37.3 C) 99.5 F (37.5 C)  TempSrc: Oral Oral Oral Oral  SpO2: 94% 93% 92% 94%  Weight:      Height:        Intake/Output Summary (Last 24 hours) at 09/25/2020 0756 Last data filed at 09/25/2020 0024 Gross per 24 hour  Intake 1743.81 ml  Output 725 ml  Net 1018.81 ml   Last 3 Weights 09/24/2020 09/23/2020 09/16/2020  Weight (lbs) 220 lb 10.9 oz 212 lb 219 lb 3.2 oz  Weight (kg) 100.1 kg 96.163 kg 99.428 kg  Some encounter information is confidential and restricted. Go to Review Flowsheets activity to see all data.      Telemetry    Atrial fibrillation with rates predominately in the 110s-120s - Personally Reviewed  ECG    No new tracings - Personally Reviewed  Physical Exam   GEN: No acute distress.   Neck: No JVD Cardiac: IRIR, no murmurs, rubs, or gallops.  Respiratory: Clear to auscultation bilaterally. GI: Soft, obese, nontender, non-distended  MS: No edema; No  deformity. Neuro:  Nonfocal  Psych: Normal affect   Labs    High Sensitivity Troponin:  No results for input(s): TROPONINIHS in the last 720 hours.   Chemistry Recent Labs  Lab 09/23/20 1416 09/24/20 0432 09/25/20 0422  NA 138 137 136  K 4.4 4.2 4.1  CL 98 102 103  CO2 26 23 23   GLUCOSE 229* 160* 169*  BUN 27* 24* 23  CREATININE 1.44* 1.19 0.89  CALCIUM 9.3 8.3* 8.4*  PROT 7.8 6.4*  --   ALBUMIN 4.1 3.3*  --   AST 21 21  --   ALT 18 17  --   ALKPHOS 62 54  --   BILITOT 1.4* 1.0  --   GFRNONAA 53* >60 >60  ANIONGAP 14 12 10     Lipids No results for input(s): CHOL, TRIG, HDL, LABVLDL, LDLCALC, CHOLHDL in the last 168 hours.  Hematology Recent Labs  Lab 09/23/20 1416 09/24/20 0432 09/25/20 0422  WBC 9.3 9.7 7.1  RBC 3.92* 3.09* 3.07*  HGB 13.3 10.4* 10.5*  HCT 38.8* 30.9* 29.9*  MCV 99.0 100.0 97.4  MCH 33.9 33.7 34.2*  MCHC 34.3 33.7 35.1  RDW 12.8 12.8 12.6  PLT 165 126* 134*   Thyroid No results for input(s): TSH, FREET4 in the last 168 hours.  BNPNo results for input(s): BNP, PROBNP in the last 168 hours.  DDimer No results for input(s): DDIMER in the last 168 hours.   Radiology    DG Chest 2 View  Result Date: 09/23/2020 CLINICAL DATA:  hypotension, fever, EXAM: CHEST - 2 VIEW COMPARISON:  None. FINDINGS: Elevation of the right hemidiaphragm. The cardiomediastinal silhouette is within normal limits. No pleural effusion. No pneumothorax. No mass or consolidation. Bilateral shoulder arthroplasty. IMPRESSION: Elevation of the right hemidiaphragm without other acute focal process identified in the chest. Electronically Signed   By: Albin Felling M.D.   On: 09/23/2020 15:07    Cardiac Studies   Echocardiogram 08/2020: 1. Left ventricular ejection fraction, by estimation, is 60 to 65%. The  left ventricle has normal function. The left ventricle has no regional  wall motion abnormalities. Left ventricular diastolic parameters were  normal.   2. Right  ventricular systolic function is normal. The right ventricular  size is normal. There is normal pulmonary artery systolic pressure.   3. Left atrial size was moderately dilated.   4. The mitral valve is abnormal. No evidence of mitral valve  regurgitation. No evidence of mitral stenosis. Moderate mitral annular  calcification.   5. The aortic valve is abnormal. There is mild calcification of the  aortic valve. There is mild thickening of the aortic valve. Aortic valve  regurgitation is trivial. Mild to moderate aortic valve  sclerosis/calcification is present, without any evidence   of aortic stenosis.   6. The inferior vena cava is normal in size with greater than 50%  respiratory variability, suggesting right atrial pressure of 3 mmHg.   Patient Profile     70 y.o. male with a PMH of s/p PCI/DES to LAD in 2012, persistent atrial fibrillation, HTN, HLD, DM type II, osteoarthritis, who is being followed by cardiology for the evaluation of atrial fibrillation with RVR   Assessment & Plan    1.  Atrial fibrillation with RVR: Patient has likely had persistent atrial fibrillation since outpatient visit with cardiology 09/16/2020 after recent successful cardioversion 08/28/2020.  He is scheduled to see Dr. Curt Bears with EP 10/28/2020 to discuss possible ablation.  He is currently admitted for urosepsis, likely driving RVR.  He has been intermittently hypotensive limiting rate control strategies.  Heart rates predominantly in the 110s to 120s but up to 170s overnight. BP has been stable. - Will increase metoprolol tartrate to 25mg  4 times daily with hold parameters.  Anticipate consolidation back to home metoprolol succinate as BP improves and adequate rate control is achieved. - Low threshold to add amiodarone if rates remain elevated and BP remains soft. - Suspect rates will continue to improve as he improves from a sepsis standpoint - Continue Eliquis 5 mg twice daily for stroke ppx   2. CAD s/p  PCI/DES to LAD in 2012: No anginal complaints.  No ischemic changes on EKG.   - Continue aspirin - Continue statin and Zetia - Continue beta-blocker   3. HTN: BP intermittently soft in the setting of sepsis.  Home amlodipine-valsartan on hold - Continue metoprolol for rate control strategies as above   4. HLD: LDL 64 01/2020 - Continue crestor and zetia   5. DM type 2: A1C 6.2 this admission on metformin at home. - Continue management per primary team   6. Urosepsis: patient presented with hematuria, urinary hesitancy, nausea, and vomiting. Ucx with E.coli and Bcx with NGTD. He is on IV antibiotics. Still having intermittent fevers - Continue IV antibiotics  per primary team.      For questions or updates, please contact [place] Please consult [email or web address] for contact info under        Signed, Abigail Butts, PA-C  09/25/2020, 7:56 AM    Patient seen and examined.  Agree with above documentation.  On exam, patient is alert and oriented, irregular, tachycardic, no murmurs, lungs CTAB, no LE edema.  Review of telemetry shows A. fib with rates 100s to 120s.  Suspect RVR being driven by sepsis.  Expect improvement in rates as his infection is treated.  Hypotension has resolved.  Given room on BP, will increase metoprolol to 25 mg 4 times daily.  Donato Heinz, MD

## 2020-09-26 ENCOUNTER — Inpatient Hospital Stay (HOSPITAL_COMMUNITY): Payer: Medicare Other

## 2020-09-26 DIAGNOSIS — N390 Urinary tract infection, site not specified: Secondary | ICD-10-CM | POA: Diagnosis not present

## 2020-09-26 DIAGNOSIS — A419 Sepsis, unspecified organism: Secondary | ICD-10-CM | POA: Diagnosis not present

## 2020-09-26 DIAGNOSIS — I4891 Unspecified atrial fibrillation: Secondary | ICD-10-CM | POA: Diagnosis not present

## 2020-09-26 DIAGNOSIS — A4150 Gram-negative sepsis, unspecified: Secondary | ICD-10-CM | POA: Diagnosis not present

## 2020-09-26 DIAGNOSIS — N179 Acute kidney failure, unspecified: Secondary | ICD-10-CM

## 2020-09-26 DIAGNOSIS — R509 Fever, unspecified: Secondary | ICD-10-CM | POA: Diagnosis not present

## 2020-09-26 DIAGNOSIS — R6520 Severe sepsis without septic shock: Secondary | ICD-10-CM | POA: Diagnosis not present

## 2020-09-26 DIAGNOSIS — N401 Enlarged prostate with lower urinary tract symptoms: Secondary | ICD-10-CM

## 2020-09-26 LAB — CBC
HCT: 31.4 % — ABNORMAL LOW (ref 39.0–52.0)
Hemoglobin: 11 g/dL — ABNORMAL LOW (ref 13.0–17.0)
MCH: 34 pg (ref 26.0–34.0)
MCHC: 35 g/dL (ref 30.0–36.0)
MCV: 96.9 fL (ref 80.0–100.0)
Platelets: 143 10*3/uL — ABNORMAL LOW (ref 150–400)
RBC: 3.24 MIL/uL — ABNORMAL LOW (ref 4.22–5.81)
RDW: 12.5 % (ref 11.5–15.5)
WBC: 8.6 10*3/uL (ref 4.0–10.5)
nRBC: 0 % (ref 0.0–0.2)

## 2020-09-26 LAB — GLUCOSE, CAPILLARY
Glucose-Capillary: 129 mg/dL — ABNORMAL HIGH (ref 70–99)
Glucose-Capillary: 150 mg/dL — ABNORMAL HIGH (ref 70–99)
Glucose-Capillary: 151 mg/dL — ABNORMAL HIGH (ref 70–99)
Glucose-Capillary: 153 mg/dL — ABNORMAL HIGH (ref 70–99)
Glucose-Capillary: 154 mg/dL — ABNORMAL HIGH (ref 70–99)

## 2020-09-26 LAB — MAGNESIUM: Magnesium: 1.5 mg/dL — ABNORMAL LOW (ref 1.7–2.4)

## 2020-09-26 MED ORDER — IOHEXOL 350 MG/ML SOLN
100.0000 mL | Freq: Once | INTRAVENOUS | Status: AC | PRN
  Administered 2020-09-26: 80 mL via INTRAVENOUS

## 2020-09-26 MED ORDER — IOHEXOL 9 MG/ML PO SOLN
ORAL | Status: AC
  Administered 2020-09-26: 1000 mL
  Filled 2020-09-26: qty 1000

## 2020-09-26 MED ORDER — CEFAZOLIN SODIUM-DEXTROSE 2-4 GM/100ML-% IV SOLN
2.0000 g | Freq: Three times a day (TID) | INTRAVENOUS | Status: DC
  Administered 2020-09-26 – 2020-09-28 (×5): 2 g via INTRAVENOUS
  Filled 2020-09-26 (×6): qty 100

## 2020-09-26 MED ORDER — DILTIAZEM HCL 30 MG PO TABS
30.0000 mg | ORAL_TABLET | Freq: Three times a day (TID) | ORAL | Status: DC
  Administered 2020-09-26 – 2020-09-27 (×4): 30 mg via ORAL
  Filled 2020-09-26 (×4): qty 1

## 2020-09-26 MED ORDER — MAGNESIUM SULFATE 4 GM/100ML IV SOLN
4.0000 g | Freq: Once | INTRAVENOUS | Status: AC
  Administered 2020-09-26: 2 g via INTRAVENOUS
  Filled 2020-09-26: qty 100

## 2020-09-26 MED ORDER — PIPERACILLIN-TAZOBACTAM 3.375 G IVPB
3.3750 g | Freq: Three times a day (TID) | INTRAVENOUS | Status: DC
  Administered 2020-09-26: 3.375 g via INTRAVENOUS
  Filled 2020-09-26: qty 50

## 2020-09-26 MED ORDER — IOHEXOL 9 MG/ML PO SOLN
500.0000 mL | ORAL | Status: AC
  Administered 2020-09-26 (×2): 500 mL via ORAL

## 2020-09-26 NOTE — Telephone Encounter (Signed)
Lvm asking pt to call back.  Need to know if he still gets med at [facility] or through [facility] now.  Mailing a letter.
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
## 2020-09-26 NOTE — Progress Notes (Signed)
Pt has completed drinking contrast medium in preparation for CT scan.
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
## 2020-09-26 NOTE — Progress Notes (Addendum)
Progress Note  Patient Name: Travis Howell Date of Encounter: 09/26/2020  Primary Cardiologist: Larae Grooms, MD  Subjective   Denies any CP or SOB. No awareness of HR today. Has some wheezing on exam. 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  Inpatient Medications    Scheduled Meds:  apixaban  5 mg Oral BID   aspirin  81 mg Oral Daily   cefadroxil  1,000 mg Oral BID   ezetimibe  10 mg Oral Daily   insulin aspart  0-9 Units Subcutaneous TID WC   metoprolol tartrate  25 mg Oral QID   rosuvastatin  5 mg Oral Daily   tamsulosin  0.4 mg Oral QPC supper   Continuous Infusions:  PRN Meds: acetaminophen **OR** acetaminophen, albuterol, senna-docusate   Vital Signs    Vitals:   09/25/20 2015 09/26/20 0023 09/26/20 0114 09/26/20 0445  BP: 113/83 139/87  117/82  Pulse: (!) 115 (!) 115  (!) 109  Resp: 18 20  18   Temp: 98.6 F (37 C) (!) 100.5 F (38.1 C) (!) 102.6 F (39.2 C) 98.1 F (36.7 C)  TempSrc: Oral Oral Oral Oral  SpO2: 94% 94%  93%  Weight:      Height:        Intake/Output Summary (Last 24 hours) at 09/26/2020 0933 Last data filed at 09/26/2020 0859 Gross per 24 hour  Intake 720 ml  Output 800 ml  Net -80 ml   Last 3 Weights 09/24/2020 09/23/2020 09/16/2020  Weight (lbs) 220 lb 10.9 oz 212 lb 219 lb 3.2 oz  Weight (kg) 100.1 kg 96.163 kg 99.428 kg  Some encounter information is confidential and restricted. Go to Review Flowsheets activity to see all data.     Telemetry    Atrial fib rates low 100s-1teens - Personally Reviewed  Physical Exam   GEN: No acute distress. Obese. Comfortable, smiling. HEENT: Normocephalic, atraumatic, sclera non-icteric. Neck: No JVD or bruits. Cardiac: Irreg irreg, tachycardic, no murmurs, rubs, or gallops.  Respiratory: good air movement but scattered expiratory wheezing. Breathing is unlabored. GI: Soft, nontender, non-distended, BS +x  4. MS: no deformity. Extremities: No clubbing or cyanosis. No edema. Distal pedal pulses are 2+ and equal bilaterally. Neuro:  AAOx3. Follows commands. Psych:  Responds to questions appropriately with a normal affect.  Labs    High Sensitivity Troponin:  No results for input(s): TROPONINIHS in the last 720 hours.    Cardiac EnzymesNo results for input(s): TROPONINI in the last 168 hours. No results for input(s): TROPIPOC in the last 168 hours.   Chemistry Recent Labs  Lab 09/23/20 1416 09/24/20 0432 09/25/20 0422  NA 138 137 136  K 4.4 4.2 4.1  CL 98 102 103  CO2 26 23 23   GLUCOSE 229* 160* 169*  BUN 27* 24* 23  CREATININE 1.44* 1.19 0.89  CALCIUM 9.3 8.3* 8.4*  PROT 7.8 6.4*  --   ALBUMIN 4.1 3.3*  --   AST 21 21  --   ALT 18 17  --   ALKPHOS 62 54  --   BILITOT 1.4* 1.0  --   GFRNONAA 53* >60 >60  ANIONGAP 14 12 10      Hematology Recent Labs  Lab 09/24/20 0432 09/25/20 0422 09/26/20 0445  WBC 9.7 7.1 8.6  RBC 3.09* 3.07* 3.24*  HGB 10.4* 10.5* 11.0*  HCT 30.9* 29.9* 31.4*  MCV 100.0 97.4 96.9  MCH 33.7 34.2* 34.0  MCHC 33.7 35.1 35.0  RDW 12.8 12.6 12.5  PLT 126* 134* 143*    BNPNo results for input(s): BNP, PROBNP in the last 168 hours.   DDimer No results for input(s): DDIMER in the last 168 hours.   Radiology    No results found.  Cardiac Studies   2D Echo 08/2020   1. Left ventricular ejection fraction, by estimation, is 60 to 65%. The  left ventricle has normal function. The left ventricle has no regional  wall motion abnormalities. Left ventricular diastolic parameters were  normal.   2. Right ventricular systolic function is normal. The right ventricular  size is normal. There is normal pulmonary artery systolic pressure.   3. Left atrial size was moderately dilated.   4. The mitral valve is abnormal. No evidence of mitral valve  regurgitation. No evidence of mitral stenosis. Moderate mitral annular  calcification.   5. The aortic  valve is abnormal. There is mild calcification of the  aortic valve. There is mild thickening of the aortic valve. Aortic valve  regurgitation is trivial. Mild to moderate aortic valve  sclerosis/calcification is present, without any evidence   of aortic stenosis.   6. The inferior vena cava is normal in size with greater than 50%  respiratory variability, suggesting right atrial pressure of 3 mmHg.   Patient Profile     70 y.o. male with a PMH of s/p PCI/DES to LAD in 2012, persistent atrial fibrillation, HTN, HLD, DM type II, osteoarthritis, who is being followed by cardiology for the evaluation of atrial fibrillation with RVR   Assessment & Plan    1. Persistent atrial fib with RVR - recently had cardioversion 08/28/20 with reversion back to AF by OV 09/16/20 - was pending OP appt to EP to review ablation - he is admitted for sepsis due to UTI likely driving heart rates (hypotension has intermittently limited rate control strategy) - HR remains 109-1teens with stable BP - I will add trial of diltiazem 30mg  q8hr today - suspect rates will continue to improve as he improves from a sepsis standpoint - would not pursue repeat DCCV while acutely ill - continue Eliquis 5 mg twice daily for stroke ppx - recheck thyroid in AM and add baseline Mg to labs - Addendum: Mg low at 1.5 - will supplement with 4g given normal renal function and f/u level in AM - consider OP sleep study   2. CAD s/p PCI/DES to LAD in 2012 - no anginal complaints or ischemic changes on EKG.   - has been maintained on baby ASA in addition to Eliquis by primary cardiologist due to prior PCI - continue statin, Zetia, BB management as above - HLD management: LDL 64 in 01/2020  3. Sepsis due to UTI - patient presented with hematuria, urinary hesitancy, nausea, and vomiting. Ucx with E.coli and Bcx with NGTD - still with intermittent fevers up to 102.6 last night -> IM is consulting ID today  4. 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 Essential HTN - with hypotension this admission, managed in context of above  Other issues noted include initial lactic acidosis and AKI on admission (improved) and anemia/thrombocytopenia by labs. Will order f/u BMET/CBC in AM  For questions or updates, please  contact [place] Please consult [email or web address] for contact info under Cardiology/STEMI.  Signed, Charlie Pitter, PA-C 09/26/2020, 9:33 AM     Patient seen and examined.  Agree with above documentation.  On exam, patient is alert and oriented, irregular rhythm, tachycardic, no murmurs, lungs CTAB, no LE edema or JVD.  Telemetry shows heart rates 100s to 120s in atrial fibrillation.  Continues to have fevers, which suspect is driving his elevated heart rates.  Diltiazem added.  Donato Heinz, MD

## 2020-09-26 NOTE — Progress Notes (Signed)
PHARMACY NOTE -  [facility] has been assisting with dosing of Zosyn for complicated UTI. Dosage remains stable at 3.375 g IV q8 hr and further renal adjustments per institutional Pharmacy antibiotic protocol  Pharmacy will sign off, following peripherally for culture results or dose adjustments. Please reconsult if a change in clinical status warrants re-evaluation of dosage.  Reuel Boom, PharmD, BCPS [phone] 09/26/2020, 5:29 PM
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
## 2020-09-26 NOTE — Progress Notes (Signed)
PROGRESS NOTE   Travis Howell  JYN:[id number]    DOB: Oct 21, 1950    DOA: 09/23/2020  PCP: [name], MD   I have briefly reviewed patients previous medical records in [facility].  Chief Complaint  Patient presents with   Dysuria   Emesis    Brief Narrative:  70 year old male with medical history significant for BPH (follows with Dr. [name], urology), CAD s/p prior MI and stent, hypertension, type II DM, hyperlipidemia, A. fib on Eliquis, UTIs, presented with complaints of nausea, vomiting and feeling weak.  He also reported 4 days history of hematuria, difficulty urinating and some dysuria.  He was seen by Dr. [name] in the office, was feeling poorly, about to pass out, hypotensive, nausea and vomiting and hence was directed to the ED.  Admitted for sepsis due to UTI, A. fib with RVR and acute kidney injury.  Course complicated by hypotension.  Cardiology consulted.  Due to persisting fevers despite appropriate IV antibiotics, consulted ID.   Assessment & Plan:  Active Problems:   Atrial fibrillation with RVR (HCC)   Sepsis (HCC)   Sepsis due to gram-negative UTI (HCC)   AKI (acute kidney injury) ([place])   Hypotension   Severe sepsis (POA) due to E. coli UTI/recurrent UTI-patient met criteria for sepsis with intermittent hypotension, tachycardia, tachypnea and a source.  Urine microscopy consistent with UTI.  Urine culture: Pansensitive E. coli.  Blood cultures x2: Negative to date.  E. coli UTI in June 2021, pansensitive except to levofloxacin. -Spiked temperature of 102.7 on 9/20 afternoon.  Since then has defervesced.  Completed 3 days of IV ceftriaxone.  Monitor overnight to oral antibiotics and complete 5-7 days course.  Although sepsis physiology had resolved, patient again spiked 102+ degrees Fahrenheit last night.  ID consulted.  Checking CTA chest, abdomen and pelvis for FUO work-up.   Acute kidney injury-in the setting of sepsis, intermittent obstructive  symptoms at home.  Continue fluids, bladder scan as above, monitor renal function.  Hold valsartan.  Resolved after IV fluids.  A. fib with RVR-driven by #1, continue fluids, antibiotics.  Ongoing A. fib with RVR mostly in the 110s-120s, asymptomatic however.  Per chart review, complicated A. fib, failed cardioversion, not a candidate for flecainide due to CAD, amiodarone with long-term side effects.  Has upcoming EP cardiology appointment to consider ablation.  Unable to titrate meds i.e. beta-blockers or start diltiazem due to hypotension.  Cardiology thereby consulted, adjusted metoprolol 12.5 Mg 4 times daily.  Metoprolol increased to 25 Mg 4 times daily.  Ongoing RVR, likely driven by fever, diltiazem added.  Hypotension: Likely multifactorial, dehydration, sepsis, A. fib with RVR.  Resolved after IV fluids.  Lactic acidosis: Secondary to sepsis and dehydration.  Resolved.  Hyperlipidemia-continue home medications   Essential hypertension-hold amlodipine/valsartan due to hypotension.  Beta-blockers also to be used with holding parameters.  BPH-continue home medications.  Hematuria could be due to cystitis.  Seems to be clearing up and mild.  Denies any recent procedures or catheterization.  Outpatient follow-up with Dr. [name].   Type 2 diabetes mellitus-hold metformin, placed on sliding scale.  Labile and mildly uncontrolled.  Acute anemia: Hemoglobin dropped from his baseline of 12-13 g to 10.4.  Do not think that this is due to the hematuria.  Suspect dilutional and acute illness.  Stable.  Thrombocytopenia: Secondary to sepsis.  Follow CBC.  Body mass index is 33.55 kg/m.   DVT prophylaxis:   On full dose apixaban   Code  Status: Full Code Family Communication: Discussed with patient's son at bedside today. Disposition:  Status is: Inpatient  Remains inpatient appropriate because:Hemodynamically unstable, IV treatments appropriate due to intensity of illness or inability to  take PO, and Inpatient level of care appropriate due to severity of illness  Dispo: The patient is from: Home              Anticipated d/c is to: Home possibly 9/22              Patient currently is not medically stable to d/c.   Difficult to place patient No        Consultants:   Cardiology  Procedures:   None  Antimicrobials:    Anti-infectives (From admission, onward)    Start     Dose/Rate Route Frequency Ordered Stop   09/26/20 1000  cefadroxil (DURICEF) capsule 1,000 mg        1,000 mg Oral 2 times daily 09/25/20 1530     09/24/20 1500  cefTRIAXone (ROCEPHIN) 2 g in sodium chloride 0.9 % 100 mL IVPB        2 g 200 mL/hr over 30 Minutes Intravenous Every 24 hours 09/23/20 1652 09/25/20 1539   09/23/20 1530  cefTRIAXone (ROCEPHIN) 2 g in sodium chloride 0.9 % 100 mL IVPB        2 g 200 mL/hr over 30 Minutes Intravenous  Once 09/23/20 1523 09/23/20 1614         Subjective:  Overnight high fevers.  Reports feeling bad during fever episodes.  Did detailed review of systems without any clear source identified.  Objective:   Vitals:   09/26/20 0023 09/26/20 0114 09/26/20 0445 09/26/20 1145  BP: 139/87  117/82 117/70  Pulse: (!) 115  (!) 109 (!) 108  Resp: '20  18 19  ' Temp: (!) 100.5 F (38.1 C) (!) 102.6 F (39.2 C) 98.1 F (36.7 C) 98.3 F (36.8 C)  TempSrc: Oral Oral Oral Oral  SpO2: 94%  93% 95%  Weight:      Height:        General exam: Middle-age male, lying comfortably propped up in bed without distress.  Does not appear septic or toxic. Respiratory system: Clear to auscultation.  No increased work of breathing. Cardiovascular system: S1 and S2 heard, irregularly irregular and tachycardic.  No JVD or pedal edema.  Telemetry personally reviewed: A. fib with RVR in the 110s-120s. Gastrointestinal system: Abdomen is nondistended, soft and nontender. No organomegaly or masses felt. Normal bowel sounds heard. GU: Slightly blood-tinged urine in urinal at  bedside. Central nervous system: Alert and oriented. No focal neurological deficits. Extremities: Symmetric 5 x 5 power. Skin: No rashes, lesions or ulcers Psychiatry: Judgement and insight appear normal. Mood & affect appropriate.     Data Reviewed:   I have personally reviewed following labs and imaging studies   CBC: Recent Labs  Lab 09/23/20 1416 09/24/20 0432 09/25/20 0422 09/26/20 0445  WBC 9.3 9.7 7.1 8.6  NEUTROABS 8.1*  --   --   --   HGB 13.3 10.4* 10.5* 11.0*  HCT 38.8* 30.9* 29.9* 31.4*  MCV 99.0 100.0 97.4 96.9  PLT 165 126* 134* 143*    Basic Metabolic Panel: Recent Labs  Lab 09/23/20 1416 09/24/20 0432 09/25/20 0422 09/26/20 0445  NA 138 137 136  --   K 4.4 4.2 4.1  --   CL 98 102 103  --   CO2 '26 23 23  ' --   GLUCOSE  229* 160* 169*  --   BUN 27* 24* 23  --   CREATININE 1.44* 1.19 0.89  --   CALCIUM 9.3 8.3* 8.4*  --   MG  --   --   --  1.5*    Liver Function Tests: Recent Labs  Lab 09/23/20 1416 09/24/20 0432  AST 21 21  ALT 18 17  ALKPHOS 62 54  BILITOT 1.4* 1.0  PROT 7.8 6.4*  ALBUMIN 4.1 3.3*    CBG: Recent Labs  Lab 09/26/20 0025 09/26/20 0734 09/26/20 1142  GLUCAP 129* 154* 151*    Microbiology Studies:   Recent Results (from the past 240 hour(s))  Resp Panel by RT-PCR (Flu A&B, Covid) Nasopharyngeal Swab     Status: None   Collection Time: 09/23/20  3:41 PM   Specimen: Nasopharyngeal Swab; Nasopharyngeal(NP) swabs in vial transport medium  Result Value Ref Range Status   SARS Coronavirus 2 by RT PCR NEGATIVE NEGATIVE Final    Comment: (NOTE) SARS-CoV-2 target nucleic acids are NOT DETECTED.  The SARS-CoV-2 RNA is generally detectable in upper respiratory specimens during the acute phase of infection. The lowest concentration of SARS-CoV-2 viral copies this assay can detect is 138 copies/mL. A negative result does not preclude SARS-Cov-2 infection and should not be used as the sole basis for treatment or other  patient management decisions. A negative result may occur with  improper specimen collection/handling, submission of specimen other than nasopharyngeal swab, presence of viral mutation(s) within the areas targeted by this assay, and inadequate number of viral copies(<138 copies/mL). A negative result must be combined with clinical observations, patient history, and epidemiological information. The expected result is Negative.  Fact Sheet for Patients:  EntrepreneurPulse.com.au  Fact Sheet for Healthcare Providers:  IncredibleEmployment.be  This test is no t yet approved or cleared by the [place] FDA and  has been authorized for detection and/or diagnosis of SARS-CoV-2 by FDA under an Emergency Use Authorization (EUA). This EUA will remain  in effect (meaning this test can be used) for the duration of the COVID-19 declaration under Section 564(b)(1) of the Act, 21 U.S.C.section 360bbb-3(b)(1), unless the authorization is terminated  or revoked sooner.       Influenza A by PCR NEGATIVE NEGATIVE Final   Influenza B by PCR NEGATIVE NEGATIVE Final    Comment: (NOTE) The Xpert Xpress SARS-CoV-2/FLU/RSV plus assay is intended as an aid in the diagnosis of influenza from Nasopharyngeal swab specimens and should not be used as a sole basis for treatment. Nasal washings and aspirates are unacceptable for Xpert Xpress SARS-CoV-2/FLU/RSV testing.  Fact Sheet for Patients: EntrepreneurPulse.com.au  Fact Sheet for Healthcare Providers: IncredibleEmployment.be  This test is not yet approved or cleared by the [place] FDA and has been authorized for detection and/or diagnosis of SARS-CoV-2 by FDA under an Emergency Use Authorization (EUA). This EUA will remain in effect (meaning this test can be used) for the duration of the COVID-19 declaration under Section 564(b)(1) of the Act, 21 U.S.C. section  360bbb-3(b)(1), unless the authorization is terminated or revoked.  Performed at [facility], [place] [address] [place], [place] [address]   Blood Culture (routine x 2)     Status: None (Preliminary result)   Collection Time: 09/23/20  3:41 PM   Specimen: BLOOD  Result Value Ref Range Status   Specimen Description   Final    BLOOD LEFT ANTECUBITAL Performed at [place] [address], [place], [place] [address]  Special Requests   Final    BOTTLES DRAWN AEROBIC AND ANAEROBIC Blood Culture results may not be optimal due to an excessive volume of blood received in culture bottles Performed at [place] [address] [place], [place] [address]    Culture   Final    NO GROWTH 3 DAYS Performed at [facility], [place] [address] [place], [place] [address]    Report Status PENDING  Incomplete  Urine Culture     Status: Abnormal   Collection Time: 09/23/20  3:59 PM   Specimen: Urine, Clean Catch  Result Value Ref Range Status   Specimen Description   Final    URINE, CLEAN CATCH Performed at [facility], [place] [address] [place], [place] [address]    Special Requests   Final    NONE Performed at [facility], [place] [address] [place], [place] [address]    Culture >=100,000 COLONIES/mL ESCHERICHIA COLI (A)  Final   Report Status 09/25/2020 FINAL  Final   Organism ID, Bacteria ESCHERICHIA COLI (A)  Final      Susceptibility   Escherichia coli - MIC*    AMPICILLIN 8 SENSITIVE Sensitive     CEFAZOLIN <=4 SENSITIVE Sensitive     CEFEPIME <=0.12 SENSITIVE Sensitive     CEFTRIAXONE <=0.25 SENSITIVE Sensitive     CIPROFLOXACIN 0.5 SENSITIVE Sensitive     GENTAMICIN <=1 SENSITIVE Sensitive     IMIPENEM <=0.25 SENSITIVE Sensitive     NITROFURANTOIN <=16 SENSITIVE Sensitive     TRIMETH/SULFA <=20 SENSITIVE Sensitive     AMPICILLIN/SULBACTAM 4 SENSITIVE Sensitive     PIP/TAZO <=4  SENSITIVE Sensitive     * >=100,000 COLONIES/mL ESCHERICHIA COLI  Blood Culture (routine x 2)     Status: None (Preliminary result)   Collection Time: 09/23/20  5:41 PM   Specimen: BLOOD  Result Value Ref Range Status   Specimen Description   Final    BLOOD RIGHT ANTECUBITAL Performed at [place] [address], [place], [place] [address]    Special Requests   Final    BOTTLES DRAWN AEROBIC AND ANAEROBIC Blood Culture results may not be optimal due to an excessive volume of blood received in culture bottles Performed at [place] [address], [place], [place] [address]    Culture   Final    NO GROWTH 3 DAYS Performed at [facility], [place] [address] [place], [place] [address]    Report Status PENDING  Incomplete     Radiology Studies:  No results found.   Scheduled Meds:    apixaban  5 mg Oral BID   aspirin  81 mg Oral Daily   cefadroxil  1,000 mg Oral BID   diltiazem  30 mg Oral Q8H   ezetimibe  10 mg Oral Daily   insulin aspart  0-9 Units Subcutaneous TID WC   metoprolol tartrate  25 mg Oral QID   rosuvastatin  5 mg Oral Daily   tamsulosin  0.4 mg Oral QPC supper    Continuous Infusions:       LOS: 2 days     Vernell Leep, MD, FACP, [facility]. [facility]    To contact the attending provider between 7A-7P or the covering provider during after hours 7P-7A, please log into the web site [email or web address] and access using universal [place] password for that web site. If you do not have the password, please call the hospital  operator.  09/26/2020, 4:51 PM

## 2020-09-26 NOTE — Progress Notes (Addendum)
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  Made patient n.p.o.  Started IV Zosyn.  Consulted urology - Dr. Milford Cage.    CT also showed some lung findings suggestive of infectious etiology but no clear consolidation.  IV Zosyn should cover  Vernell Leep, MD, [place], [facility]. [facility]  To contact the attending provider between 7A-7P or the covering provider during after hours 7P-7A, please log into the web site [email or web address] and access using universal [place] password for that web site. If you do not have the password, please call the hospital operator.

## 2020-09-27 ENCOUNTER — Inpatient Hospital Stay (HOSPITAL_COMMUNITY): Payer: Medicare Other

## 2020-09-27 ENCOUNTER — Inpatient Hospital Stay (HOSPITAL_COMMUNITY): Payer: Medicare Other | Admitting: Anesthesiology

## 2020-09-27 ENCOUNTER — Other Ambulatory Visit: Payer: Self-pay | Admitting: Family Medicine

## 2020-09-27 ENCOUNTER — Encounter (HOSPITAL_COMMUNITY)
Admission: EM | Disposition: A | Discharge status: Home / Self Care | Payer: Self-pay | Source: Home / Self Care | Attending: Internal Medicine

## 2020-09-27 ENCOUNTER — Encounter (HOSPITAL_COMMUNITY): Payer: Self-pay | Admitting: Internal Medicine

## 2020-09-27 DIAGNOSIS — N390 Urinary tract infection, site not specified: Secondary | ICD-10-CM | POA: Diagnosis not present

## 2020-09-27 DIAGNOSIS — R509 Fever, unspecified: Secondary | ICD-10-CM | POA: Diagnosis not present

## 2020-09-27 DIAGNOSIS — N179 Acute kidney failure, unspecified: Secondary | ICD-10-CM | POA: Diagnosis not present

## 2020-09-27 DIAGNOSIS — I4891 Unspecified atrial fibrillation: Secondary | ICD-10-CM | POA: Diagnosis not present

## 2020-09-27 DIAGNOSIS — N12 Tubulo-interstitial nephritis, not specified as acute or chronic: Secondary | ICD-10-CM | POA: Diagnosis not present

## 2020-09-27 DIAGNOSIS — A4150 Gram-negative sepsis, unspecified: Secondary | ICD-10-CM | POA: Diagnosis not present

## 2020-09-27 DIAGNOSIS — A419 Sepsis, unspecified organism: Secondary | ICD-10-CM | POA: Diagnosis not present

## 2020-09-27 DIAGNOSIS — R6520 Severe sepsis without septic shock: Secondary | ICD-10-CM | POA: Diagnosis not present

## 2020-09-27 HISTORY — DX: Tubulo-interstitial nephritis, not specified as acute or chronic: N12

## 2020-09-27 HISTORY — PX: CYSTOSCOPY WITH STENT PLACEMENT: SHX5790

## 2020-09-27 LAB — GLUCOSE, CAPILLARY
Glucose-Capillary: 143 mg/dL — ABNORMAL HIGH (ref 70–99)
Glucose-Capillary: 158 mg/dL — ABNORMAL HIGH (ref 70–99)
Glucose-Capillary: 301 mg/dL — ABNORMAL HIGH (ref 70–99)

## 2020-09-27 LAB — BASIC METABOLIC PANEL
Anion gap: 12 (ref 5–15)
BUN: 14 mg/dL (ref 8–23)
CO2: 26 mmol/L (ref 22–32)
Calcium: 8.6 mg/dL — ABNORMAL LOW (ref 8.9–10.3)
Chloride: 102 mmol/L (ref 98–111)
Creatinine, Ser: 0.87 mg/dL (ref 0.61–1.24)
GFR, Estimated: >60 mL/min (ref >60)
Glucose, Bld: 139 mg/dL — ABNORMAL HIGH (ref 70–99)
Potassium: 3.7 mmol/L (ref 3.5–5.1)
Sodium: 140 mmol/L (ref 135–145)

## 2020-09-27 LAB — TSH: TSH: 1.782 u[IU]/mL (ref 0.350–4.500)

## 2020-09-27 LAB — ECHOCARDIOGRAM COMPLETE
Area-P 1/2: 2.41 cm2
Calc EF: 49.4 %
Height: 68 in
S' Lateral: 3 cm
Single Plane A2C EF: 49.5 %
Single Plane A4C EF: 48.3 %
Weight: 3530.89 oz

## 2020-09-27 LAB — CBC
HCT: 29.9 % — ABNORMAL LOW (ref 39.0–52.0)
Hemoglobin: 10.6 g/dL — ABNORMAL LOW (ref 13.0–17.0)
MCH: 33.5 pg (ref 26.0–34.0)
MCHC: 35.5 g/dL (ref 30.0–36.0)
MCV: 94.6 fL (ref 80.0–100.0)
Platelets: 181 10*3/uL (ref 150–400)
RBC: 3.16 MIL/uL — ABNORMAL LOW (ref 4.22–5.81)
RDW: 12.3 % (ref 11.5–15.5)
WBC: 6.2 10*3/uL (ref 4.0–10.5)
nRBC: 0 % (ref 0.0–0.2)

## 2020-09-27 LAB — SURGICAL PCR SCREEN
MRSA, PCR: NEGATIVE
Staphylococcus aureus: NEGATIVE

## 2020-09-27 LAB — MAGNESIUM: Magnesium: 1.7 mg/dL (ref 1.7–2.4)

## 2020-09-27 SURGERY — CYSTOSCOPY, WITH STENT INSERTION
Anesthesia: General | Site: Ureter | Laterality: Right

## 2020-09-27 MED ORDER — IOHEXOL 300 MG/ML  SOLN
INTRAMUSCULAR | Status: DC | PRN
  Administered 2020-09-27: 20 mL

## 2020-09-27 MED ORDER — PROPOFOL 10 MG/ML IV BOLUS
INTRAVENOUS | Status: DC | PRN
  Administered 2020-09-27: 170 mg via INTRAVENOUS

## 2020-09-27 MED ORDER — LIDOCAINE 2% (20 MG/ML) 5 ML SYRINGE
INTRAMUSCULAR | Status: DC | PRN
  Administered 2020-09-27: 60 mg via INTRAVENOUS

## 2020-09-27 MED ORDER — PHENYLEPHRINE 40 MCG/ML (10ML) SYRINGE FOR IV PUSH (FOR BLOOD PRESSURE SUPPORT)
PREFILLED_SYRINGE | INTRAVENOUS | Status: AC
  Filled 2020-09-27: qty 10

## 2020-09-27 MED ORDER — OXYCODONE HCL 5 MG/5ML PO SOLN: 5.0000 mg | Freq: Once | ORAL | Status: DC | PRN

## 2020-09-27 MED ORDER — LACTATED RINGERS IV SOLN: INTRAVENOUS | Status: DC | PRN

## 2020-09-27 MED ORDER — METOPROLOL SUCCINATE ER 50 MG PO TB24
50.0000 mg | ORAL_TABLET | Freq: Two times a day (BID) | ORAL | Status: DC
  Administered 2020-09-27: 50 mg via ORAL
  Filled 2020-09-27: qty 1

## 2020-09-27 MED ORDER — PHENYLEPHRINE 40 MCG/ML (10ML) SYRINGE FOR IV PUSH (FOR BLOOD PRESSURE SUPPORT)
PREFILLED_SYRINGE | INTRAVENOUS | Status: DC | PRN
  Administered 2020-09-27: 80 ug via INTRAVENOUS

## 2020-09-27 MED ORDER — COLCHICINE 0.6 MG PO TABS
0.6000 mg | ORAL_TABLET | Freq: Every day | ORAL | Status: DC
  Administered 2020-09-27: 0.6 mg via ORAL
  Filled 2020-09-27 (×2): qty 1

## 2020-09-27 MED ORDER — FENTANYL CITRATE (PF) 100 MCG/2ML IJ SOLN
INTRAMUSCULAR | Status: DC | PRN
  Administered 2020-09-27 (×2): 25 ug via INTRAVENOUS
  Administered 2020-09-27: 50 ug via INTRAVENOUS

## 2020-09-27 MED ORDER — PROPOFOL 10 MG/ML IV BOLUS
INTRAVENOUS | Status: AC
  Filled 2020-09-27: qty 20

## 2020-09-27 MED ORDER — OXYCODONE HCL 5 MG PO TABS: 5.0000 mg | ORAL_TABLET | Freq: Once | ORAL | Status: DC | PRN

## 2020-09-27 MED ORDER — ONDANSETRON HCL 4 MG/2ML IJ SOLN
INTRAMUSCULAR | Status: DC | PRN
  Administered 2020-09-27: 4 mg via INTRAVENOUS

## 2020-09-27 MED ORDER — DEXAMETHASONE SODIUM PHOSPHATE 10 MG/ML IJ SOLN
INTRAMUSCULAR | Status: DC | PRN
  Administered 2020-09-27: 10 mg via INTRAVENOUS

## 2020-09-27 MED ORDER — MIDAZOLAM HCL 5 MG/5ML IJ SOLN
INTRAMUSCULAR | Status: DC | PRN
  Administered 2020-09-27: 2 mg via INTRAVENOUS

## 2020-09-27 MED ORDER — MIDAZOLAM HCL 2 MG/2ML IJ SOLN
INTRAMUSCULAR | Status: AC
  Filled 2020-09-27: qty 2

## 2020-09-27 MED ORDER — FENTANYL CITRATE (PF) 100 MCG/2ML IJ SOLN
INTRAMUSCULAR | Status: AC
  Filled 2020-09-27: qty 2

## 2020-09-27 MED ORDER — ONDANSETRON HCL 4 MG/2ML IJ SOLN: 4.0000 mg | Freq: Four times a day (QID) | INTRAMUSCULAR | Status: DC | PRN

## 2020-09-27 MED ORDER — DEXAMETHASONE SODIUM PHOSPHATE 10 MG/ML IJ SOLN
INTRAMUSCULAR | Status: AC
  Filled 2020-09-27: qty 1

## 2020-09-27 MED ORDER — FENTANYL CITRATE PF 50 MCG/ML IJ SOSY: 25.0000 ug | PREFILLED_SYRINGE | INTRAMUSCULAR | Status: DC | PRN

## 2020-09-27 MED ORDER — LIDOCAINE HCL (PF) 2 % IJ SOLN
INTRAMUSCULAR | Status: AC
  Filled 2020-09-27: qty 5

## 2020-09-27 MED ORDER — STERILE WATER FOR IRRIGATION IR SOLN
Status: DC | PRN
  Administered 2020-09-27: 3000 mL

## 2020-09-27 MED ORDER — ONDANSETRON HCL 4 MG/2ML IJ SOLN
INTRAMUSCULAR | Status: AC
  Filled 2020-09-27: qty 2

## 2020-09-27 SURGICAL SUPPLY — 14 items
BAG URO CATCHER STRL LF (MISCELLANEOUS) ×2 IMPLANT
BULB IRRIG PATHFIND (MISCELLANEOUS) IMPLANT
CATH INTERMIT  6FR 70CM (CATHETERS) ×2 IMPLANT
CLOTH BEACON ORANGE TIMEOUT ST (SAFETY) ×2 IMPLANT
GLOVE SURG ENC TEXT LTX SZ7.5 (GLOVE) ×2 IMPLANT
GOWN STRL REUS W/TWL LRG LVL3 (GOWN DISPOSABLE) ×4 IMPLANT
GUIDEWIRE STR DUAL SENSOR (WIRE) ×2 IMPLANT
KIT TURNOVER KIT A (KITS) ×2 IMPLANT
MANIFOLD NEPTUNE II (INSTRUMENTS) ×2 IMPLANT
PACK CYSTO (CUSTOM PROCEDURE TRAY) ×2 IMPLANT
STENT URET 6FRX26 CONTOUR (STENTS) ×1 IMPLANT
SYR 20ML LL LF (SYRINGE) ×2 IMPLANT
TUBING CONNECTING 10 (TUBING) ×2 IMPLANT
TUBING UROLOGY SET (TUBING) IMPLANT

## 2020-09-27 NOTE — Op Note (Signed)
Preoperative diagnosis:  1.  1.  Right ureteral calculus with obstruction and emphysematous pyelonephritis  Postoperative diagnosis: 1.  Same  Procedure(s): 1.  Cystoscopy, right retrograde pyelogram with intraoperative interpretation, insertion right JJ stent  Surgeon: Dr. Harold Barban  Anesthesia: General  Complications: None  EBL: Minimal  Specimens: None  Disposition of specimens: Not applicable  Intraoperative findings: 1 cm right proximal ureteral calculus with significant hydronephrosis and obstruction.  Able to manipulate stent back to the renal pelvis and 6 French by 26 cm Percuflex plus soft contour stent placed.  Urine culture sent from renal pelvis via open tip catheter  Indication: 70 year old white male with 1 cm right proximal ureteral calculus and what appears to be emphysematous pyelonephritis.  Presents this time undergo cystoscopy insertion of right JJ stent  Description of procedure:  After obtaining form consent the patient was taken the major cystoscopy suite placed under general anesthesia.  Placed in the dorsolithotomy position genitalia prepped draped usual sterile fashion.  Proper pause and timeout was performed for site of procedure.  3 French cystoscope was advanced in the bladder.  The bladder had several erythematous areas within the urinary bladder consistent with cystitis but no other mucosal lesions noted.  The right ureteral orifice was identified and cannulated with a 5 French open tip catheter.  Gentle retrograde pyelogram confirmed filling defect and no significant retrograde flow of contrast passed stone in the proximal ureter.  I manipulated the operative catheter up to the level of the stone and subsequently was able to manipulate a sensor wire to the renal pelvis.  The open tip catheter was then advanced over the guidewire to the renal pelvis and in doing so the calculus migrated retrograde into the renal pelvis.  There was hydronephrotic flow  of clear urine.  A sample was collected from the renal pelvis and sent for culture.  A sensor wire was then passed back to the renal pelvis and coiled in the open tip catheter was removed leaving the guidewire in place.  A 6 French by 26 cm Percuflex plus soft Contour stent was placed leaving a proximal coil in the renal pelvis and a distal coil in the bladder.  There was brisk flow of urine through and around the stent noted.  Bladder was emptied procedure terminated.  He was awakened from anesthesia and taken back to the recovery room in stable condition.  No immediate complication from the procedure.

## 2020-09-27 NOTE — Care Management Important Message (Signed)
Important Message  Patient Details IM Letter given to the Patient. Name: Travis Howell MRN: [id number] Date of Birth: 05-03-1950   Medicare Important Message Given:  Yes     Kerin Salen 09/27/2020, 10:58 AM

## 2020-09-27 NOTE — Anesthesia Procedure Notes (Signed)
Procedure Name: LMA Insertion Date/Time: 09/27/2020 7:34 AM Performed by: Aubreana Cornacchia D, CRNA Pre-anesthesia Checklist: Patient identified, Emergency Drugs available, Suction available and Patient being monitored Patient Re-evaluated:Patient Re-evaluated prior to induction Oxygen Delivery Method: Circle system utilized Preoxygenation: Pre-oxygenation with 100% oxygen Induction Type: IV induction Ventilation: Mask ventilation without difficulty LMA: LMA inserted LMA Size: 5.0 Tube type: Oral Number of attempts: 1 Placement Confirmation: positive ETCO2 and breath sounds checked- equal and bilateral Tube secured with: Tape Dental Injury: Teeth and Oropharynx as per pre-operative assessment

## 2020-09-27 NOTE — Anesthesia Preprocedure Evaluation (Signed)
Anesthesia Evaluation  Patient identified by MRN, date of birth, ID band Patient awake    Reviewed: Allergy & Precautions, H&P , NPO status , Patient's Chart, lab work & pertinent test results  Airway Mallampati: II   Neck ROM: full    Dental   Pulmonary former smoker,    breath sounds clear to auscultation       Cardiovascular hypertension, + CAD, + Past MI and + Cardiac Stents   Rhythm:regular Rate:Normal     Neuro/Psych PSYCHIATRIC DISORDERS Anxiety Depression  Neuromuscular disease    GI/Hepatic   Endo/Other  diabetes, Type 2  Renal/GU stones     Musculoskeletal  (+) Arthritis ,   Abdominal   Peds  Hematology   Anesthesia Other Findings   Reproductive/Obstetrics                             Anesthesia Physical Anesthesia Plan  ASA: 3  Anesthesia Plan: General   Post-op Pain Management:    Induction: Intravenous  PONV Risk Score and Plan: 2 and Ondansetron, Dexamethasone, Midazolam and Treatment may vary due to age or medical condition  Airway Management Planned: LMA  Additional Equipment:   Intra-op Plan:   Post-operative Plan: Extubation in OR  Informed Consent: I have reviewed the patients History and Physical, chart, labs and discussed the procedure including the risks, benefits and alternatives for the proposed anesthesia with the patient or authorized representative who has indicated his/her understanding and acceptance.     Dental advisory given  Plan Discussed with: CRNA, Anesthesiologist and Surgeon  Anesthesia Plan Comments:         Anesthesia Quick Evaluation

## 2020-09-27 NOTE — Progress Notes (Addendum)
Progress Note  Patient Name: Travis Howell Date of Encounter: 09/27/2020  Primary Cardiologist: Larae Grooms, MD  Subjective   It is Travis Howell's birthday today!  He went to OR this AM for cystoscopy and insertion of R JJ stent due to right ureteral calculus with obstruction and emphysematous pyelonephritis. He feels well this AM without any CP, SOB, palpitations and is in good spirits. Son at bedside.  Inpatient Medications    Scheduled Meds:  apixaban  5 mg Oral BID   aspirin  81 mg Oral Daily   colchicine  0.6 mg Oral Daily   diltiazem  30 mg Oral Q8H   ezetimibe  10 mg Oral Daily   insulin aspart  0-9 Units Subcutaneous TID WC   metoprolol tartrate  25 mg Oral QID   rosuvastatin  5 mg Oral Daily   tamsulosin  0.4 mg Oral QPC supper   Continuous Infusions:   ceFAZolin (ANCEF) IV 2 g (09/27/20 0504)   PRN Meds: acetaminophen **OR** acetaminophen, albuterol, senna-docusate   Vital Signs    Vitals:   09/27/20 0815 09/27/20 0830 09/27/20 0845 09/27/20 0907  BP: 100/73 105/64 104/78 110/74  Pulse: 100 91 95 78  Resp: 16 19 20 18   Temp:   98.3 F (36.8 C) 97.9 F (36.6 C)  TempSrc:    Oral  SpO2: 99% 94% 96% 99%  Weight:      Height:        Intake/Output Summary (Last 24 hours) at 09/27/2020 1047 Last data filed at 09/27/2020 1019 Gross per 24 hour  Intake 1180 ml  Output 2025 ml  Net -845 ml   Last 3 Weights 09/24/2020 09/23/2020 09/16/2020  Weight (lbs) 220 lb 10.9 oz 212 lb 219 lb 3.2 oz  Weight (kg) 100.1 kg 96.163 kg 99.428 kg  Some encounter information is confidential and restricted. Go to Review Flowsheets activity to see all data.     Telemetry    Atrial fib rates 100-120s earlier but presently 90s - Personally Reviewed   Physical Exam   GEN: No acute distress, obese WM  HEENT: Normocephalic, atraumatic, sclera non-icteric. Neck: No JVD or bruits. Cardiac: Irregularly irregular, no murmurs, rubs, or gallops.  Respiratory: No further  wheezing. No rhonchi or rales. Mildly decreased BS right lung base. Breathing is unlabored. GI: Soft, nontender, non-distended, BS +x 4. MS: no deformity. Extremities: No clubbing or cyanosis. No edema. Distal pedal pulses are 2+ and equal bilaterally. Neuro:  AAOx3. Follows commands. Psych:  Responds to questions appropriately with a normal affect.  Labs    High Sensitivity Troponin:  No results for input(s): TROPONINIHS in the last 720 hours.    Cardiac EnzymesNo results for input(s): TROPONINI in the last 168 hours. No results for input(s): TROPIPOC in the last 168 hours.   Chemistry Recent Labs  Lab 09/23/20 1416 09/24/20 0432 09/25/20 0422 09/27/20 0434  NA 138 137 136 140  K 4.4 4.2 4.1 3.7  CL 98 102 103 102  CO2 26 23 23 26   GLUCOSE 229* 160* 169* 139*  BUN 27* 24* 23 14  CREATININE 1.44* 1.19 0.89 0.87  CALCIUM 9.3 8.3* 8.4* 8.6*  PROT 7.8 6.4*  --   --   ALBUMIN 4.1 3.3*  --   --   AST 21 21  --   --   ALT 18 17  --   --   ALKPHOS 62 54  --   --   BILITOT 1.4* 1.0  --   --  GFRNONAA 53* >60 >60 >60  ANIONGAP 14 12 10 12      Hematology Recent Labs  Lab 09/25/20 0422 09/26/20 0445 09/27/20 0434  WBC 7.1 8.6 6.2  RBC 3.07* 3.24* 3.16*  HGB 10.5* 11.0* 10.6*  HCT 29.9* 31.4* 29.9*  MCV 97.4 96.9 94.6  MCH 34.2* 34.0 33.5  MCHC 35.1 35.0 35.5  RDW 12.6 12.5 12.3  PLT 134* 143* 181    BNPNo results for input(s): BNP, PROBNP in the last 168 hours.   DDimer No results for input(s): DDIMER in the last 168 hours.   Radiology    CT CHEST W CONTRAST  Result Date: 09/26/2020 CLINICAL DATA:  Fever of unknown origin. EXAM: CT CHEST, ABDOMEN, AND PELVIS WITH CONTRAST TECHNIQUE: Multidetector CT imaging of the chest, abdomen and pelvis was performed following the standard protocol during bolus administration of intravenous contrast. CONTRAST:  43mL OMNIPAQUE IOHEXOL 350 MG/ML SOLN COMPARISON:  Two views chest 09/26/2015. FINDINGS: CT CHEST FINDINGS  Cardiovascular: Aorta is normal in size. There are atherosclerotic calcifications of the aorta and coronary arteries. Normal heart size. No pericardial effusion. Mediastinum/Nodes: There are scattered nonenlarged mediastinal and hilar lymph nodes. The esophagus is visualized thyroid gland are within normal limits. Lungs/Pleura: There is no pleural effusion or pneumothorax. There is linear atelectasis in the lung bases. There are multifocal tree-in-bud, ground-glass and ill-defined nodular densities in the bilateral upper lobes and left lower lobe compatible with infectious/inflammatory process. There also some minimal peripheral reticular opacities in both lung bases, nonspecific. 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 No acute fractures are seen. There is a prominent Schmorl's node along the inferior endplate of O35. right-sided diaphragmatic hernia is noted. This is unchanged from prior. CT ABDOMEN PELVIS FINDINGS Hepatobiliary: There is diffuse fatty infiltration of the liver. Gallbladder and bile ducts appear within normal limits. Pancreas: There is a punctate calculus in the pancreatic head which can be seen with chronic pancreatitis. No pancreatic ductal dilatation or surrounding inflammatory changes. Spleen: Normal in size without focal abnormality. Adrenals/Urinary Tract: The bilateral adrenal glands, left kidney, and bladder are within normal limits. There is a 9 x 6 x 10 mm calculus at the ureterovesicular junction. There is moderate right-sided hydronephrosis. Air is seen within the right renal pelvis and collecting system worrisome for infection. There is some patchy areas of cortical hypodensity in the right kidney suspicious for pyelonephritis. There is right perinephric and Peri ureteral fat stranding. There is also enhancement and stranding surrounding the mid/distal ureter. No focal fluid collection to suggest abscess. Stomach/Bowel: Stomach is within normal limits. Appendix  appears normal. No evidence of bowel wall thickening, distention, or inflammatory changes. There is sigmoid colon diverticulosis without evidence for acute diverticulitis. Vascular/Lymphatic: Aortic atherosclerosis. No enlarged abdominal or pelvic lymph nodes. Reproductive: There are dense calcifications in the prostate gland. The prostate is nonenlarged. Other: No abdominal wall hernia or abnormality. No abdominopelvic ascites. 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 There is no listhesis. There are degenerative changes at L5-S1. IMPRESSION: 1. Right-sided emphysematous pyelonephritis. There is air in the renal collecting system. Infection also involves the right ureter. 2. 10 mm calculus at the right ureteropelvic junction with moderate obstructive uropathy. 3. Scattered tree-in-bud, ground-glass and ill-defined nodular densities in the left lower lobe and bilateral upper lobes compatible with infectious/inflammatory process. Follow-up imaging recommended to confirm complete resolution. 4. Sigmoid colon diverticulosis without evidence for diverticulitis. 5. Aortic Atherosclerosis (ICD10-I70.0). These results were called by telephone at the time of interpretation on  09/26/2020 at 5:15 pm to provider Dr. Vernell Leep, who verbally acknowledged these results. Electronically Signed   By: Ronney Asters M.D.   On: 09/26/2020 17:15   CT ABDOMEN PELVIS W CONTRAST  Result Date: 09/26/2020 CLINICAL DATA:  Fever of unknown origin. EXAM: CT CHEST, ABDOMEN, AND PELVIS WITH CONTRAST TECHNIQUE: Multidetector CT imaging of the chest, abdomen and pelvis was performed following the standard protocol during bolus administration of intravenous contrast. CONTRAST:  27mL OMNIPAQUE IOHEXOL 350 MG/ML SOLN COMPARISON:  Two views chest 09/26/2015. FINDINGS: CT CHEST FINDINGS Cardiovascular: Aorta is normal in size. There are atherosclerotic calcifications of the aorta and coronary arteries. Normal  heart size. No pericardial effusion. Mediastinum/Nodes: There are scattered nonenlarged mediastinal and hilar lymph nodes. The esophagus is visualized thyroid gland are within normal limits. Lungs/Pleura: There is no pleural effusion or pneumothorax. There is linear atelectasis in the lung bases. There are multifocal tree-in-bud, ground-glass and ill-defined nodular densities in the bilateral upper lobes and left lower lobe compatible with infectious/inflammatory process. There also some minimal peripheral reticular opacities in both lung bases, nonspecific. 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 No acute fractures are seen. There is a prominent Schmorl's node along the inferior endplate of V42. right-sided diaphragmatic hernia is noted. This is unchanged from prior. CT ABDOMEN PELVIS FINDINGS Hepatobiliary: There is diffuse fatty infiltration of the liver. Gallbladder and bile ducts appear within normal limits. Pancreas: There is a punctate calculus in the pancreatic head which can be seen with chronic pancreatitis. No pancreatic ductal dilatation or surrounding inflammatory changes. Spleen: Normal in size without focal abnormality. Adrenals/Urinary Tract: The bilateral adrenal glands, left kidney, and bladder are within normal limits. There is a 9 x 6 x 10 mm calculus at the ureterovesicular junction. There is moderate right-sided hydronephrosis. Air is seen within the right renal pelvis and collecting system worrisome for infection. There is some patchy areas of cortical hypodensity in the right kidney suspicious for pyelonephritis. There is right perinephric and Peri ureteral fat stranding. There is also enhancement and stranding surrounding the mid/distal ureter. No focal fluid collection to suggest abscess. Stomach/Bowel: Stomach is within normal limits. Appendix appears normal. No evidence of bowel wall thickening, distention, or inflammatory changes. There is sigmoid colon  diverticulosis without evidence for acute diverticulitis. Vascular/Lymphatic: Aortic atherosclerosis. No enlarged abdominal or pelvic lymph nodes. Reproductive: There are dense calcifications in the prostate gland. The prostate is nonenlarged. Other: No abdominal wall hernia or abnormality. No abdominopelvic ascites. 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 There is no listhesis. There are degenerative changes at L5-S1. IMPRESSION: 1. Right-sided emphysematous pyelonephritis. There is air in the renal collecting system. Infection also involves the right ureter. 2. 10 mm calculus at the right ureteropelvic junction with moderate obstructive uropathy. 3. Scattered tree-in-bud, ground-glass and ill-defined nodular densities in the left lower lobe and bilateral upper lobes compatible with infectious/inflammatory process. Follow-up imaging recommended to confirm complete resolution. 4. Sigmoid colon diverticulosis without evidence for diverticulitis. 5. Aortic Atherosclerosis (ICD10-I70.0). These results were called by telephone at the time of interpretation on 09/26/2020 at 5:15 pm to provider Dr. Vernell Leep, who verbally acknowledged these results. Electronically Signed   By: Ronney Asters M.D.   On: 09/26/2020 17:15   DG C-Arm 1-60 Min-No Report  Result Date: 09/27/2020 Fluoroscopy was utilized by the requesting physician.  No radiographic interpretation.    Cardiac Studies   2D Echo 08/2020   1. Left ventricular ejection fraction, by estimation, is 60 to 65%.  The  left ventricle has normal function. The left ventricle has no regional  wall motion abnormalities. Left ventricular diastolic parameters were  normal.   2. Right ventricular systolic function is normal. The right ventricular  size is normal. There is normal pulmonary artery systolic pressure.   3. Left atrial size was moderately dilated.   4. The mitral valve is abnormal. No evidence of mitral valve   regurgitation. No evidence of mitral stenosis. Moderate mitral annular  calcification.   5. The aortic valve is abnormal. There is mild calcification of the  aortic valve. There is mild thickening of the aortic valve. Aortic valve  regurgitation is trivial. Mild to moderate aortic valve  sclerosis/calcification is present, without any evidence   of aortic stenosis.   6. The inferior vena cava is normal in size with greater than 50%  respiratory variability, suggesting right atrial pressure of 3 mmHg.   Patient Profile     70 y.o. male with a PMH of s/p PCI/DES to LAD in 2012, persistent atrial fibrillation, HTN, HLD, DM type II, osteoarthritis who was admitted with sepsis due to UTI. He is being followed by cardiology for the evaluation of atrial fibrillation with RVR.  Assessment & Plan    1. Persistent atrial fib with RVR - recently had cardioversion 08/28/20 with reversion back to AF by OV 09/16/20 - was pending OP appt to EP to review ablation - he is admitted for sepsis due to UTI likely driving heart rates - on metoprolol 25mg  QID plus addition of diltiazem 30mg  q8hr - will discuss consolidation today with MD versus following HR as he clinically improves medically with consolidation tomorrow  - would keep plan for OP f/u with EP 10/28/20 - continue Eliquis 5 mg twice daily for stroke ppx if OK with primary teams - TSH wnl - consider OP sleep study - ID has also ordered echocardiogram given fevers, will defer to their team if this is still needed given CT findings   2. CAD s/p PCI/DES to LAD in 2012 - no anginal complaints or ischemic changes on EKG.   - has been maintained on baby ASA in addition to Eliquis by primary cardiologist due to prior PCI - continue statin, Zetia, BB management as above - HLD management: LDL 64 in 01/2020   3. Sepsis due to UTI and emphysematous pyelonephritis - patient presented with hematuria, urinary hesitancy, nausea, and vomiting. Ucx with E.coli  and Bcx with NGTD - ID has also ordered echocardiogram given fevers, will defer to their team if this is still needed given CT findings - s/p OR 09/27/20   4. 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 Algis Liming who will be following patient up in AM again to reassess   5. Essential HTN - with hypotension this admission, managed in context of above   Other issues noted include initial lactic acidosis and AKI on admission (improved) and anemia/thrombocytopenia by labs. Hopeful for continued clinical improvement. Will defer lyte management to primary team.  For questions or updates, please contact [facility] Please consult [email or web address] for contact info under  Cardiology/STEMI.  Signed, Charlie Pitter, PA-C 09/27/2020, 10:47 AM    Patient seen and examined.  Agree with above documentation.  On exam, patient is alert and oriented, irregular, tachycardic, no murmurs, lungs CTAB, no LE edema.  He was taken to the OR today by urology for cystoscopy and insertion of right JJ stent.  A. fib rates were up to 140s earlier today, currently improved and rates 100s to 110s.  Expect rates to continue to improve as he recovers.  Will consolidate regimen to toprol XL 50 mg BID.  Echocardiogram ordered today by ID, will follow up results.  Donato Heinz, MD

## 2020-09-27 NOTE — Transfer of Care (Signed)
Immediate Anesthesia Transfer of Care Note  Patient: Travis Howell  Procedure(s) Performed: CYSTOSCOPY WITH STENT PLACEMENT (Right: Ureter)  Patient Location: PACU  Anesthesia Type:General  Level of Consciousness: awake, alert  and oriented  Airway & Oxygen Therapy: Patient Spontanous Breathing and Patient connected to face mask oxygen  Post-op Assessment: Report given to RN and Post -op Vital signs reviewed and stable  Post vital signs: Reviewed and stable  Last Vitals:  Vitals Value Taken Time  BP 93/69 09/27/20 0809  Temp 37.1 C 09/27/20 0809  Pulse 103 09/27/20 0812  Resp 18 09/27/20 0812  SpO2 97 % 09/27/20 0812  Vitals shown include unvalidated device data.  Last Pain:  Vitals:   09/27/20 0809  TempSrc:   PainSc: 0-No pain         Complications: No notable events documented.

## 2020-09-27 NOTE — Interval H&P Note (Signed)
History and Physical Interval Note:  09/27/2020 7:13 AM  Travis Howell  has presented today for surgery, with the diagnosis of right ureteral stone.  The various methods of treatment have been discussed with the patient and family. After consideration of risks, benefits and other options for treatment, the patient has consented to  Procedure(s): CYSTOSCOPY WITH STENT PLACEMENT (Right) as a surgical intervention.  The patient's history has been reviewed, patient examined, no change in status, stable for surgery.  I have reviewed the patient's chart and labs.  Questions were answered to the patient's satisfaction.     [name]

## 2020-09-27 NOTE — Progress Notes (Signed)
PROGRESS NOTE   Travis Howell  ZGY:[id number]    DOB: February 20, 1950    DOA: 09/23/2020  PCP: [name], MD   I have briefly reviewed patients previous medical records in [facility].  Chief Complaint  Patient presents with   Dysuria   Emesis    Brief Narrative:  70 year old male with medical history significant for BPH (follows with Dr. [name], urology), CAD s/p prior MI and stent, hypertension, type II DM, hyperlipidemia, A. fib on Eliquis, UTIs, presented with complaints of nausea, vomiting and feeling weak.  He also reported 4 days history of hematuria, difficulty urinating and some dysuria.  He was seen by Dr. [name] in the office, was feeling poorly, about to pass out, hypotensive, nausea and vomiting and hence was directed to the ED.  Admitted for sepsis due to E. coli acute right emphysematous pyelonephritis due to obstructing ureteral calculi, A. fib with RVR and acute kidney injury.  Course complicated by hypotension.  Cardiology consulted.  Due to persisting fevers despite appropriate IV antibiotics, consulted ID.  Urology consulted, underwent cystoscopy with right ureteral stent placement 9/23.   Assessment & Plan:  Principal Problem:   Emphysematous pyelonephritis Active Problems:   Atrial fibrillation with RVR (HCC)   Sepsis (HCC)   Sepsis due to gram-negative UTI (HCC)   AKI (acute kidney injury) ([place])   Hypotension   Severe sepsis (POA) due to E. coli acute right emphysematous pyelonephritis due to obstructing ureteral calculi/recurrent UTI-patient met criteria for sepsis with intermittent hypotension, tachycardia, tachypnea and a source.  Urine microscopy consistent with UTI.  Urine culture: Pansensitive E. coli.  Blood cultures x2: Negative to date.  E. coli UTI in June 2021, pansensitive except to levofloxacin.  Despite IV ceftriaxone/appropriate sensitivity, had ongoing high fevers.  ID consulted.  Obtain CT chest abdomen and pelvis which revealed  emphysematous right pyelonephritis with obstructing stone.  Urology was consulted.  9/23, underwent cystoscopy, retrograde pyelogram and placement of JJ stent.  New cultures were sent at procedure.  Briefly on IV Zosyn, ID has switched to cefazolin.  As he improves, ID recommend changing back to cefadroxil for total 2 weeks therapy.  Right-sided emphysematous pyelonephritis with obstructive uropathy due to right UPJ stone: S/p cystoscopy and stent placement by urology.  Management per urology.  Acute kidney injury-in the setting of sepsis, intermittent obstructive symptoms at home.  Continue fluids, bladder scan as above, monitor renal function.  Hold valsartan.  Resolved after IV fluids.  A. fib with RVR-driven by #1, continue fluids, antibiotics.  Ongoing A. fib with RVR mostly in the 110s-120s, asymptomatic however.  Per chart review, complicated A. fib, failed cardioversion, not a candidate for flecainide due to CAD, amiodarone with long-term side effects.  Has upcoming EP cardiology appointment to consider ablation.  Due to difficulty in getting his A. fib under control, cardiology was consulted and have been titrating meds.  Currently on metoprolol 25 Mg 4 times daily and diltiazem 30 mg 3 times daily.  RVR was likely driven by sepsis.  Await cardiology input regarding consolidating meds with plans for possible discharge tomorrow.  Continue Eliquis. TSH normal.  Follow 2D echo ordered by ID.  Hypotension: Likely multifactorial, dehydration, sepsis, A. fib with RVR.  Resolved after IV fluids.  Lactic acidosis: Secondary to sepsis and dehydration.  Resolved.  Hyperlipidemia-continue home medications   Essential hypertension-currently controlled on metoprolol and diltiazem.  BPH-continue home medications.  Hematuria could be due to cystitis.  Seems to be clearing up  and mild.  Denies any recent procedures or catheterization.  Outpatient follow-up with Dr. [name].   Type 2 diabetes  mellitus-hold metformin, placed on sliding scale.  Labile and mildly uncontrolled.  Acute anemia: Hemoglobin dropped from his baseline of 12-13 g to 10.4.  Do not think that this is due to the hematuria.  Suspect dilutional and acute illness.  Stable.  Thrombocytopenia: Secondary to sepsis.  Resolved  Body mass index is 33.55 kg/m.   DVT prophylaxis:   On full dose apixaban   Code Status: Full Code Family Communication: Discussed with patient's son at bedside today. Disposition:  Status is: Inpatient  Remains inpatient appropriate because:Hemodynamically unstable, IV treatments appropriate due to intensity of illness or inability to take PO, and Inpatient level of care appropriate due to severity of illness  Dispo: The patient is from: Home              Anticipated d/c is to: Home possibly 9/24              Patient currently is not medically stable to d/c.   Difficult to place patient No        Consultants:   Cardiology  Procedures:   None  Antimicrobials:    Anti-infectives (From admission, onward)    Start     Dose/Rate Route Frequency Ordered Stop   09/26/20 2315  ceFAZolin (ANCEF) IVPB 2g/100 mL premix        2 g 200 mL/hr over 30 Minutes Intravenous Every 8 hours 09/26/20 2216     09/26/20 1815  piperacillin-tazobactam (ZOSYN) IVPB 3.375 g  Status:  Discontinued        3.375 g 12.5 mL/hr over 240 Minutes Intravenous Every 8 hours 09/26/20 1728 09/26/20 2215   09/26/20 1000  cefadroxil (DURICEF) capsule 1,000 mg  Status:  Discontinued        1,000 mg Oral 2 times daily 09/25/20 1530 09/26/20 1721   09/24/20 1500  cefTRIAXone (ROCEPHIN) 2 g in sodium chloride 0.9 % 100 mL IVPB        2 g 200 mL/hr over 30 Minutes Intravenous Every 24 hours 09/23/20 1652 09/25/20 1539   09/23/20 1530  cefTRIAXone (ROCEPHIN) 2 g in sodium chloride 0.9 % 100 mL IVPB        2 g 200 mL/hr over 30 Minutes Intravenous  Once 09/23/20 1523 09/23/20 1614         Subjective:   Patient seen postprocedure.  Denied complaints.  Specifically denied dyspnea or pain.  Urine dark tea colored in urinal noted, likely immediate postprocedure.  Objective:   Vitals:   09/27/20 0845 09/27/20 0907 09/27/20 1303 09/27/20 1319  BP: 104/78 110/74 115/76 102/74  Pulse: 95 78 76 (!) 106  Resp: _0 Temp: 98.3 F (36.8 C) 97.9 F (36.6 C)  (!) 97.5 F (36.4 C)  TempSrc:  Oral  Oral  SpO2: 96% 99%  94%  Weight:      Height:        General exam: Middle-age male, lying comfortably propped up in bed without distress.  Does not appear septic or toxic. Respiratory system: Clear to auscultation.  No increased work of breathing. Cardiovascular system: S1 and S2 heard, RRR.  No JVD or murmurs.  Telemetry personally reviewed: A. fib with ventricular rate in the 90s-100s. Gastrointestinal system: Abdomen is nondistended, soft and nontender. No organomegaly or masses felt. Normal bowel sounds heard. GU: Slightly blood-tinged urine in urinal at bedside. Central nervous  system: Alert and oriented. No focal neurological deficits. Extremities: Symmetric 5 x 5 power. Skin: No rashes, lesions or ulcers Psychiatry: Judgement and insight appear normal. Mood & affect appropriate.     Data Reviewed:   I have personally reviewed following labs and imaging studies   CBC: Recent Labs  Lab 09/23/20 1416 09/24/20 0432 09/25/20 0422 09/26/20 0445 09/27/20 0434  WBC 9.3   < > 7.1 8.6 6.2  NEUTROABS 8.1*  --   --   --   --   HGB 13.3   < > 10.5* 11.0* 10.6*  HCT 38.8*   < > 29.9* 31.4* 29.9*  MCV 99.0   < > 97.4 96.9 94.6  PLT 165   < > 134* 143* 181   < > = values in this interval not displayed.    Basic Metabolic Panel: Recent Labs  Lab 09/24/20 0432 09/25/20 0422 09/26/20 0445 09/27/20 0434  NA 137 136  --  140  K 4.2 4.1  --  3.7  CL 102 103  --  102  CO2 23 23  --  26  GLUCOSE 160* 169*  --  139*  BUN 24* 23  --  14  CREATININE 1.19 0.89  --  0.87  CALCIUM  8.3* 8.4*  --  8.6*  MG  --   --  1.5* 1.7    Liver Function Tests: Recent Labs  Lab 09/23/20 1416 09/24/20 0432  AST 21 21  ALT 18 17  ALKPHOS 62 54  BILITOT 1.4* 1.0  PROT 7.8 6.4*  ALBUMIN 4.1 3.3*    CBG: Recent Labs  Lab 09/26/20 2205 09/27/20 0816 09/27/20 1116  GLUCAP 150* 143* 158*    Microbiology Studies:   Recent Results (from the past 240 hour(s))  Resp Panel by RT-PCR (Flu A&B, Covid) Nasopharyngeal Swab     Status: None   Collection Time: 09/23/20  3:41 PM   Specimen: Nasopharyngeal Swab; Nasopharyngeal(NP) swabs in vial transport medium  Result Value Ref Range Status   SARS Coronavirus 2 by RT PCR NEGATIVE NEGATIVE Final    Comment: (NOTE) SARS-CoV-2 target nucleic acids are NOT DETECTED.  The SARS-CoV-2 RNA is generally detectable in upper respiratory specimens during the acute phase of infection. The lowest concentration of SARS-CoV-2 viral copies this assay can detect is 138 copies/mL. A negative result does not preclude SARS-Cov-2 infection and should not be used as the sole basis for treatment or other patient management decisions. A negative result may occur with  improper specimen collection/handling, submission of specimen other than nasopharyngeal swab, presence of viral mutation(s) within the areas targeted by this assay, and inadequate number of viral copies(<138 copies/mL). A negative result must be combined with clinical observations, patient history, and epidemiological information. The expected result is Negative.  Fact Sheet for Patients:  EntrepreneurPulse.com.au  Fact Sheet for Healthcare Providers:  IncredibleEmployment.be  This test is no t yet approved or cleared by the [place] FDA and  has been authorized for detection and/or diagnosis of SARS-CoV-2 by FDA under an Emergency Use Authorization (EUA). This EUA will remain  in effect (meaning this test can be used) for the duration of  the COVID-19 declaration under Section 564(b)(1) of the Act, 21 U.S.C.section 360bbb-3(b)(1), unless the authorization is terminated  or revoked sooner.       Influenza A by PCR NEGATIVE NEGATIVE Final   Influenza B by PCR NEGATIVE NEGATIVE Final    Comment: (NOTE) The Xpert Xpress SARS-CoV-2/FLU/RSV plus assay is intended  as an aid in the diagnosis of influenza from Nasopharyngeal swab specimens and should not be used as a sole basis for treatment. Nasal washings and aspirates are unacceptable for Xpert Xpress SARS-CoV-2/FLU/RSV testing.  Fact Sheet for Patients: EntrepreneurPulse.com.au  Fact Sheet for Healthcare Providers: IncredibleEmployment.be  This test is not yet approved or cleared by the [place] FDA and has been authorized for detection and/or diagnosis of SARS-CoV-2 by FDA under an Emergency Use Authorization (EUA). This EUA will remain in effect (meaning this test can be used) for the duration of the COVID-19 declaration under Section 564(b)(1) of the Act, 21 U.S.C. section 360bbb-3(b)(1), unless the authorization is terminated or revoked.  Performed at [facility], [place] [address] [place], [place] [address]   Blood Culture (routine x 2)     Status: None (Preliminary result)   Collection Time: 09/23/20  3:41 PM   Specimen: BLOOD  Result Value Ref Range Status   Specimen Description   Final    BLOOD LEFT ANTECUBITAL Performed at [facility] [address] [place], [place] [address]    Special Requests   Final    BOTTLES DRAWN AEROBIC AND ANAEROBIC Blood Culture results may not be optimal due to an excessive volume of blood received in culture bottles Performed at [place] [address], [place], [place] [address]    Culture   Final    NO GROWTH 3 DAYS Performed at [facility], [place] [address], [place], [place] [address]    Report Status PENDING   Incomplete  Urine Culture     Status: Abnormal   Collection Time: 09/23/20  3:59 PM   Specimen: Urine, Clean Catch  Result Value Ref Range Status   Specimen Description   Final    URINE, CLEAN CATCH Performed at [facility], [place] [address], [place], [place] [address]    Special Requests   Final    NONE Performed at [facility], [place] [address], [place], [place] [address]    Culture >=100,000 COLONIES/mL ESCHERICHIA COLI (A)  Final   Report Status 09/25/2020 FINAL  Final   Organism ID, Bacteria ESCHERICHIA COLI (A)  Final      Susceptibility   Escherichia coli - MIC*    AMPICILLIN 8 SENSITIVE Sensitive     CEFAZOLIN <=4 SENSITIVE Sensitive     CEFEPIME <=0.12 SENSITIVE Sensitive     CEFTRIAXONE <=0.25 SENSITIVE Sensitive     CIPROFLOXACIN 0.5 SENSITIVE Sensitive     GENTAMICIN <=1 SENSITIVE Sensitive     IMIPENEM <=0.25 SENSITIVE Sensitive     NITROFURANTOIN <=16 SENSITIVE Sensitive     TRIMETH/SULFA <=20 SENSITIVE Sensitive     AMPICILLIN/SULBACTAM 4 SENSITIVE Sensitive     PIP/TAZO <=4 SENSITIVE Sensitive     * >=100,000 COLONIES/mL ESCHERICHIA COLI  Blood Culture (routine x 2)     Status: None (Preliminary result)   Collection Time: 09/23/20  5:41 PM   Specimen: BLOOD  Result Value Ref Range Status   Specimen Description   Final    BLOOD RIGHT ANTECUBITAL Performed at [facility] [address], [place], [place] [address]    Special Requests   Final    BOTTLES DRAWN AEROBIC AND ANAEROBIC Blood Culture results may not be optimal due to an excessive volume of blood received in culture bottles Performed at [place] [address], [place], [place] [address]    Culture   Final  NO GROWTH 3 DAYS Performed at [facility], [place] [address], [place], [place] [address]    Report Status PENDING  Incomplete  Surgical pcr screen     Status: None   Collection Time: 09/27/20  5:35 AM    Specimen: Nasal Mucosa; Nasal Swab  Result Value Ref Range Status   MRSA, PCR NEGATIVE NEGATIVE Final   Staphylococcus aureus NEGATIVE NEGATIVE Final    Comment: (NOTE) The Xpert SA Assay (FDA approved for NASAL specimens in patients 35 years of age and older), is one component of a comprehensive surveillance program. It is not intended to diagnose infection nor to guide or monitor treatment. Performed at [facility], [place] [address], [place], [place] [address]   Aerobic/Anaerobic Culture w Gram Stain (surgical/deep wound)     Status: None (Preliminary result)   Collection Time: 09/27/20  7:51 AM   Specimen: PATH Other; Tissue  Result Value Ref Range Status   Specimen Description   Final    URINE, RANDOM RENAL PELVIS Performed at [facility] [address] [place], [place] [address]    Special Requests   Final    RIGHT URETERAL STONE Performed at [facility] [address], [place], [place] [address]    Gram Stain   Final    NO WBC SEEN NO ORGANISMS SEEN Performed at [facility], [place] [address] [place],  [address]    Culture PENDING  Incomplete   Report Status PENDING  Incomplete     Radiology Studies:  CT CHEST W CONTRAST  Result Date: 09/26/2020 CLINICAL DATA:  Fever of unknown origin. EXAM: CT CHEST, ABDOMEN, AND PELVIS WITH CONTRAST TECHNIQUE: Multidetector CT imaging of the chest, abdomen and pelvis was performed following the standard protocol during bolus administration of intravenous contrast. CONTRAST:  31m OMNIPAQUE IOHEXOL 350 MG/ML SOLN COMPARISON:  Two views chest 09/26/2015. FINDINGS: CT CHEST FINDINGS Cardiovascular: Aorta is normal in size. There are atherosclerotic calcifications of the aorta and coronary arteries. Normal heart size. No pericardial effusion. Mediastinum/Nodes: There are scattered nonenlarged mediastinal and hilar lymph nodes. The esophagus is visualized thyroid gland  are within normal limits. Lungs/Pleura: There is no pleural effusion or pneumothorax. There is linear atelectasis in the lung bases. There are multifocal tree-in-bud, ground-glass and ill-defined nodular densities in the bilateral upper lobes and left lower lobe compatible with infectious/inflammatory process. There also some minimal peripheral reticular opacities in both lung bases, nonspecific. 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 No acute fractures are seen. There is a prominent Schmorl's node along the inferior endplate of TO96 right-sided diaphragmatic hernia is noted. This is unchanged from prior. CT ABDOMEN PELVIS FINDINGS Hepatobiliary: There is diffuse fatty infiltration of the liver. Gallbladder and bile ducts appear within normal limits. Pancreas: There is a punctate calculus in the pancreatic head which can be seen with chronic pancreatitis. No pancreatic ductal dilatation or surrounding inflammatory changes. Spleen: Normal in size without focal abnormality. Adrenals/Urinary Tract: The bilateral adrenal glands, left kidney, and bladder are within normal limits. There is a 9 x 6 x 10 mm calculus at the ureterovesicular junction. There is moderate right-sided hydronephrosis. Air is seen within the right renal pelvis and collecting system worrisome for infection. There is some patchy areas of cortical hypodensity in the right kidney suspicious for pyelonephritis. There is right perinephric and Peri ureteral fat stranding. There is also enhancement and stranding surrounding the mid/distal ureter. No focal fluid collection to suggest abscess. Stomach/Bowel:  Stomach is within normal limits. Appendix appears normal. No evidence of bowel wall thickening, distention, or inflammatory changes. There is sigmoid colon diverticulosis without evidence for acute diverticulitis. Vascular/Lymphatic: Aortic atherosclerosis. No enlarged abdominal or pelvic lymph nodes. Reproductive: There are  dense calcifications in the prostate gland. The prostate is nonenlarged. Other: No abdominal wall hernia or abnormality. No abdominopelvic ascites. 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 There is no listhesis. There are degenerative changes at L5-S1. IMPRESSION: 1. Right-sided emphysematous pyelonephritis. There is air in the renal collecting system. Infection also involves the right ureter. 2. 10 mm calculus at the right ureteropelvic junction with moderate obstructive uropathy. 3. Scattered tree-in-bud, ground-glass and ill-defined nodular densities in the left lower lobe and bilateral upper lobes compatible with infectious/inflammatory process. Follow-up imaging recommended to confirm complete resolution. 4. Sigmoid colon diverticulosis without evidence for diverticulitis. 5. Aortic Atherosclerosis (ICD10-I70.0). These results were called by telephone at the time of interpretation on 09/26/2020 at 5:15 pm to provider Dr. Vernell Leep, who verbally acknowledged these results. Electronically Signed   By: Ronney Asters M.D.   On: 09/26/2020 17:15   CT ABDOMEN PELVIS W CONTRAST  Result Date: 09/26/2020 CLINICAL DATA:  Fever of unknown origin. EXAM: CT CHEST, ABDOMEN, AND PELVIS WITH CONTRAST TECHNIQUE: Multidetector CT imaging of the chest, abdomen and pelvis was performed following the standard protocol during bolus administration of intravenous contrast. CONTRAST:  81m OMNIPAQUE IOHEXOL 350 MG/ML SOLN COMPARISON:  Two views chest 09/26/2015. FINDINGS: CT CHEST FINDINGS Cardiovascular: Aorta is normal in size. There are atherosclerotic calcifications of the aorta and coronary arteries. Normal heart size. No pericardial effusion. Mediastinum/Nodes: There are scattered nonenlarged mediastinal and hilar lymph nodes. The esophagus is visualized thyroid gland are within normal limits. Lungs/Pleura: There is no pleural effusion or pneumothorax. There is linear atelectasis in the  lung bases. There are multifocal tree-in-bud, ground-glass and ill-defined nodular densities in the bilateral upper lobes and left lower lobe compatible with infectious/inflammatory process. There also some minimal peripheral reticular opacities in both lung bases, nonspecific. 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 No acute fractures are seen. There is a prominent Schmorl's node along the inferior endplate of TZ61 right-sided diaphragmatic hernia is noted. This is unchanged from prior. CT ABDOMEN PELVIS FINDINGS Hepatobiliary: There is diffuse fatty infiltration of the liver. Gallbladder and bile ducts appear within normal limits. Pancreas: There is a punctate calculus in the pancreatic head which can be seen with chronic pancreatitis. No pancreatic ductal dilatation or surrounding inflammatory changes. Spleen: Normal in size without focal abnormality. Adrenals/Urinary Tract: The bilateral adrenal glands, left kidney, and bladder are within normal limits. There is a 9 x 6 x 10 mm calculus at the ureterovesicular junction. There is moderate right-sided hydronephrosis. Air is seen within the right renal pelvis and collecting system worrisome for infection. There is some patchy areas of cortical hypodensity in the right kidney suspicious for pyelonephritis. There is right perinephric and Peri ureteral fat stranding. There is also enhancement and stranding surrounding the mid/distal ureter. No focal fluid collection to suggest abscess. Stomach/Bowel: Stomach is within normal limits. Appendix appears normal. No evidence of bowel wall thickening, distention, or inflammatory changes. There is sigmoid colon diverticulosis without evidence for acute diverticulitis. Vascular/Lymphatic: Aortic atherosclerosis. No enlarged abdominal or pelvic lymph nodes. Reproductive: There are dense calcifications in the prostate gland. The prostate is nonenlarged. Other: No abdominal wall hernia or abnormality. No  abdominopelvic ascites. 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 There is  no listhesis. There are degenerative changes at L5-S1. IMPRESSION: 1. Right-sided emphysematous pyelonephritis. There is air in the renal collecting system. Infection also involves the right ureter. 2. 10 mm calculus at the right ureteropelvic junction with moderate obstructive uropathy. 3. Scattered tree-in-bud, ground-glass and ill-defined nodular densities in the left lower lobe and bilateral upper lobes compatible with infectious/inflammatory process. Follow-up imaging recommended to confirm complete resolution. 4. Sigmoid colon diverticulosis without evidence for diverticulitis. 5. Aortic Atherosclerosis (ICD10-I70.0). These results were called by telephone at the time of interpretation on 09/26/2020 at 5:15 pm to provider Dr. Vernell Leep, who verbally acknowledged these results. Electronically Signed   By: Ronney Asters M.D.   On: 09/26/2020 17:15   DG C-Arm 1-60 Min-No Report  Result Date: 09/27/2020 Fluoroscopy was utilized by the requesting physician.  No radiographic interpretation.     Scheduled Meds:    apixaban  5 mg Oral BID   aspirin  81 mg Oral Daily   colchicine  0.6 mg Oral Daily   diltiazem  30 mg Oral Q8H   ezetimibe  10 mg Oral Daily   insulin aspart  0-9 Units Subcutaneous TID WC   metoprolol tartrate  25 mg Oral QID   rosuvastatin  5 mg Oral Daily   tamsulosin  0.4 mg Oral QPC supper    Continuous Infusions:     ceFAZolin (ANCEF) IV 2 g (09/27/20 1307)      LOS: 3 days     Vernell Leep, MD, [place], [facility]    To contact the attending provider between 7A-7P or the covering provider during after hours 7P-7A, please log into the web site [email or web address] and access using universal [place] password for that web site. If you do not have the password, please call the hospital operator.  09/27/2020, 2:38 PM

## 2020-09-27 NOTE — Progress Notes (Signed)
Subjective: No new complaints   Antibiotics:  Anti-infectives (From admission, onward)    Start     Dose/Rate Route Frequency Ordered Stop   09/26/20 2315  ceFAZolin (ANCEF) IVPB 2g/100 mL premix        2 g 200 mL/hr over 30 Minutes Intravenous Every 8 hours 09/26/20 2216     09/26/20 1815  piperacillin-tazobactam (ZOSYN) IVPB 3.375 g  Status:  Discontinued        3.375 g 12.5 mL/hr over 240 Minutes Intravenous Every 8 hours 09/26/20 1728 09/26/20 2215   09/26/20 1000  cefadroxil (DURICEF) capsule 1,000 mg  Status:  Discontinued        1,000 mg Oral 2 times daily 09/25/20 1530 09/26/20 1721   09/24/20 1500  cefTRIAXone (ROCEPHIN) 2 g in sodium chloride 0.9 % 100 mL IVPB        2 g 200 mL/hr over 30 Minutes Intravenous Every 24 hours 09/23/20 1652 09/25/20 1539   09/23/20 1530  cefTRIAXone (ROCEPHIN) 2 g in sodium chloride 0.9 % 100 mL IVPB        2 g 200 mL/hr over 30 Minutes Intravenous  Once 09/23/20 1523 09/23/20 1614       Medications: Scheduled Meds:  apixaban  5 mg Oral BID   aspirin  81 mg Oral Daily   colchicine  0.6 mg Oral Daily   diltiazem  30 mg Oral Q8H   ezetimibe  10 mg Oral Daily   insulin aspart  0-9 Units Subcutaneous TID WC   metoprolol tartrate  25 mg Oral QID   rosuvastatin  5 mg Oral Daily   tamsulosin  0.4 mg Oral QPC supper   Continuous Infusions:   ceFAZolin (ANCEF) IV 2 g (09/27/20 1307)   PRN Meds:.acetaminophen **OR** acetaminophen, albuterol, senna-docusate    Objective: Weight change:   Intake/Output Summary (Last 24 hours) at 09/27/2020 1407 Last data filed at 09/27/2020 1300 Gross per 24 hour  Intake 1060 ml  Output 2025 ml  Net -965 ml   Blood pressure 102/74, pulse (!) 106, temperature (!) 97.5 F (36.4 C), temperature source Oral, resp. rate 16, height 5\' 8"  (1.727 m), weight 100.1 kg, SpO2 94 %. Temp:  [97.5 F (36.4 C)-100.2 F (37.9 C)] 97.5 F (36.4 C) (09/23 1319) Pulse Rate:  [76-112] 106 (09/23  1319) Resp:  [16-22] 16 (09/23 1319) BP: (93-144)/(64-93) 102/74 (09/23 1319) SpO2:  [91 %-99 %] 94 % (09/23 1319)  Physical Exam: Physical Exam Constitutional:      Appearance: He is well-developed.  HENT:     Head: Normocephalic and atraumatic.  Eyes:     Conjunctiva/sclera: Conjunctivae normal.  Cardiovascular:     Rate and Rhythm: Normal rate and regular rhythm.  Pulmonary:     Effort: Pulmonary effort is normal. No respiratory distress.     Breath sounds: Normal breath sounds. No stridor. No wheezing.  Abdominal:     General: There is no distension.     Palpations: Abdomen is soft.  Musculoskeletal:        General: Normal range of motion.     Cervical back: Normal range of motion and neck supple.  Skin:    General: Skin is warm and dry.     Findings: No erythema or rash.  Neurological:     General: No focal deficit present.     Mental Status: He is alert and oriented to person, place, and time.  Psychiatric:  Mood and Affect: Mood normal.        Behavior: Behavior normal.        Thought Content: Thought content normal.        Judgment: Judgment normal.     CBC:    BMET Recent Labs    09/25/20 0422 09/27/20 0434  NA 136 140  K 4.1 3.7  CL 103 102  CO2 23 26  GLUCOSE 169* 139*  BUN 23 14  CREATININE 0.89 0.87  CALCIUM 8.4* 8.6*     Liver Panel  No results for input(s): PROT, ALBUMIN, AST, ALT, ALKPHOS, BILITOT, BILIDIR, IBILI in the last 72 hours.     Sedimentation Rate No results for input(s): ESRSEDRATE in the last 72 hours. C-Reactive Protein No results for input(s): CRP in the last 72 hours.  Micro Results: Recent Results (from the past 720 hour(s))  Resp Panel by RT-PCR (Flu A&B, Covid) Nasopharyngeal Swab     Status: None   Collection Time: 09/23/20  3:41 PM   Specimen: Nasopharyngeal Swab; Nasopharyngeal(NP) swabs in vial transport medium  Result Value Ref Range Status   SARS Coronavirus 2 by RT PCR NEGATIVE NEGATIVE Final     Comment: (NOTE) SARS-CoV-2 target nucleic acids are NOT DETECTED.  The SARS-CoV-2 RNA is generally detectable in upper respiratory specimens during the acute phase of infection. The lowest concentration of SARS-CoV-2 viral copies this assay can detect is 138 copies/mL. A negative result does not preclude SARS-Cov-2 infection and should not be used as the sole basis for treatment or other patient management decisions. A negative result may occur with  improper specimen collection/handling, submission of specimen other than nasopharyngeal swab, presence of viral mutation(s) within the areas targeted by this assay, and inadequate number of viral copies(<138 copies/mL). A negative result must be combined with clinical observations, patient history, and epidemiological information. The expected result is Negative.  Fact Sheet for Patients:  EntrepreneurPulse.com.au  Fact Sheet for Healthcare Providers:  IncredibleEmployment.be  This test is no t yet approved or cleared by the [place] FDA and  has been authorized for detection and/or diagnosis of SARS-CoV-2 by FDA under an Emergency Use Authorization (EUA). This EUA will remain  in effect (meaning this test can be used) for the duration of the COVID-19 declaration under Section 564(b)(1) of the Act, 21 U.S.C.section 360bbb-3(b)(1), unless the authorization is terminated  or revoked sooner.       Influenza A by PCR NEGATIVE NEGATIVE Final   Influenza B by PCR NEGATIVE NEGATIVE Final    Comment: (NOTE) The Xpert Xpress SARS-CoV-2/FLU/RSV plus assay is intended as an aid in the diagnosis of influenza from Nasopharyngeal swab specimens and should not be used as a sole basis for treatment. Nasal washings and aspirates are unacceptable for Xpert Xpress SARS-CoV-2/FLU/RSV testing.  Fact Sheet for Patients: EntrepreneurPulse.com.au  Fact Sheet for Healthcare  Providers: IncredibleEmployment.be  This test is not yet approved or cleared by the [place] FDA and has been authorized for detection and/or diagnosis of SARS-CoV-2 by FDA under an Emergency Use Authorization (EUA). This EUA will remain in effect (meaning this test can be used) for the duration of the COVID-19 declaration under Section 564(b)(1) of the Act, 21 U.S.C. section 360bbb-3(b)(1), unless the authorization is terminated or revoked.  Performed at [facility], [place] [address], [place], [place] [address]   Blood Culture (routine x 2)     Status: None (Preliminary result)   Collection Time: 09/23/20  3:41 PM  Specimen: BLOOD  Result Value Ref Range Status   Specimen Description   Final    BLOOD LEFT ANTECUBITAL Performed at [facility] [address], [place], [place] [address]    Special Requests   Final    BOTTLES DRAWN AEROBIC AND ANAEROBIC Blood Culture results may not be optimal due to an excessive volume of blood received in culture bottles Performed at [facility] [address] [place], [place] [address]    Culture   Final    NO GROWTH 3 DAYS Performed at [facility], [place] [address], [place], [place] [address]    Report Status PENDING  Incomplete  Urine Culture     Status: Abnormal   Collection Time: 09/23/20  3:59 PM   Specimen: Urine, Clean Catch  Result Value Ref Range Status   Specimen Description   Final    URINE, CLEAN CATCH Performed at [facility], [place] [address], [place], [place] [address]    Special Requests   Final    NONE Performed at [facility], [place] [address] [place], [place] [address]    Culture >=100,000 COLONIES/mL ESCHERICHIA COLI (A)  Final   Report Status 09/25/2020 FINAL  Final   Organism ID, Bacteria ESCHERICHIA COLI (A)  Final      Susceptibility   Escherichia coli - MIC*    AMPICILLIN 8 SENSITIVE  Sensitive     CEFAZOLIN <=4 SENSITIVE Sensitive     CEFEPIME <=0.12 SENSITIVE Sensitive     CEFTRIAXONE <=0.25 SENSITIVE Sensitive     CIPROFLOXACIN 0.5 SENSITIVE Sensitive     GENTAMICIN <=1 SENSITIVE Sensitive     IMIPENEM <=0.25 SENSITIVE Sensitive     NITROFURANTOIN <=16 SENSITIVE Sensitive     TRIMETH/SULFA <=20 SENSITIVE Sensitive     AMPICILLIN/SULBACTAM 4 SENSITIVE Sensitive     PIP/TAZO <=4 SENSITIVE Sensitive     * >=100,000 COLONIES/mL ESCHERICHIA COLI  Blood Culture (routine x 2)     Status: None (Preliminary result)   Collection Time: 09/23/20  5:41 PM   Specimen: BLOOD  Result Value Ref Range Status   Specimen Description   Final    BLOOD RIGHT ANTECUBITAL Performed at [facility] [address], [place], [place] [address]    Special Requests   Final    BOTTLES DRAWN AEROBIC AND ANAEROBIC Blood Culture results may not be optimal due to an excessive volume of blood received in culture bottles Performed at [facility] [address] [place], [place] [address]    Culture   Final    NO GROWTH 3 DAYS Performed at [facility], [place] [address], [place], [place] [address]    Report Status PENDING  Incomplete  Surgical pcr screen     Status: None   Collection Time: 09/27/20  5:35 AM   Specimen: Nasal Mucosa; Nasal Swab  Result Value Ref Range Status   MRSA, PCR NEGATIVE NEGATIVE Final   Staphylococcus aureus NEGATIVE NEGATIVE Final    Comment: (NOTE) The Xpert SA Assay (FDA approved for NASAL specimens in patients 36 years of age and older), is one component of a comprehensive surveillance program. It is not intended to diagnose infection nor to guide or monitor treatment. Performed at [facility], [place] [address], [place], [place] [address]   Aerobic/Anaerobic Culture w Gram Stain (surgical/deep wound)     Status: None (Preliminary result)   Collection Time: 09/27/20  7:51 AM  Specimen: PATH Other;  Tissue  Result Value Ref Range Status   Specimen Description   Final    URINE, RANDOM RENAL PELVIS Performed at [facility] [address], [place], [place] [address]    Special Requests   Final    RIGHT URETERAL STONE Performed at [facility] [address], [place], [place] [address]    Gram Stain   Final    NO WBC SEEN NO ORGANISMS SEEN Performed at [facility], [place] [address], [place], [place] [address]    Culture PENDING  Incomplete   Report Status PENDING  Incomplete    Studies/Results: CT CHEST W CONTRAST  Result Date: 09/26/2020 CLINICAL DATA:  Fever of unknown origin. EXAM: CT CHEST, ABDOMEN, AND PELVIS WITH CONTRAST TECHNIQUE: Multidetector CT imaging of the chest, abdomen and pelvis was performed following the standard protocol during bolus administration of intravenous contrast. CONTRAST:  30mL OMNIPAQUE IOHEXOL 350 MG/ML SOLN COMPARISON:  Two views chest 09/26/2015. FINDINGS: CT CHEST FINDINGS Cardiovascular: Aorta is normal in size. There are atherosclerotic calcifications of the aorta and coronary arteries. Normal heart size. No pericardial effusion. Mediastinum/Nodes: There are scattered nonenlarged mediastinal and hilar lymph nodes. The esophagus is visualized thyroid gland are within normal limits. Lungs/Pleura: There is no pleural effusion or pneumothorax. There is linear atelectasis in the lung bases. There are multifocal tree-in-bud, ground-glass and ill-defined nodular densities in the bilateral upper lobes and left lower lobe compatible with infectious/inflammatory process. There also some minimal peripheral reticular opacities in both lung bases, nonspecific. 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 No acute fractures are seen. There is a prominent Schmorl's node along the inferior endplate of G31. right-sided diaphragmatic hernia is noted. This is unchanged from prior. CT ABDOMEN PELVIS FINDINGS  Hepatobiliary: There is diffuse fatty infiltration of the liver. Gallbladder and bile ducts appear within normal limits. Pancreas: There is a punctate calculus in the pancreatic head which can be seen with chronic pancreatitis. No pancreatic ductal dilatation or surrounding inflammatory changes. Spleen: Normal in size without focal abnormality. Adrenals/Urinary Tract: The bilateral adrenal glands, left kidney, and bladder are within normal limits. There is a 9 x 6 x 10 mm calculus at the ureterovesicular junction. There is moderate right-sided hydronephrosis. Air is seen within the right renal pelvis and collecting system worrisome for infection. There is some patchy areas of cortical hypodensity in the right kidney suspicious for pyelonephritis. There is right perinephric and Peri ureteral fat stranding. There is also enhancement and stranding surrounding the mid/distal ureter. No focal fluid collection to suggest abscess. Stomach/Bowel: Stomach is within normal limits. Appendix appears normal. No evidence of bowel wall thickening, distention, or inflammatory changes. There is sigmoid colon diverticulosis without evidence for acute diverticulitis. Vascular/Lymphatic: Aortic atherosclerosis. No enlarged abdominal or pelvic lymph nodes. Reproductive: There are dense calcifications in the prostate gland. The prostate is nonenlarged. Other: No abdominal wall hernia or abnormality. No abdominopelvic ascites. 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 There is no listhesis. There are degenerative changes at L5-S1. IMPRESSION: 1. Right-sided emphysematous pyelonephritis. There is air in the renal collecting system. Infection also involves the right ureter. 2. 10 mm calculus at the right ureteropelvic junction with moderate obstructive uropathy. 3. Scattered tree-in-bud, ground-glass and ill-defined nodular densities in the left lower lobe and bilateral upper lobes compatible with  infectious/inflammatory process. Follow-up imaging recommended to confirm complete resolution. 4. Sigmoid colon diverticulosis without evidence for diverticulitis. 5. Aortic Atherosclerosis (ICD10-I70.0). These results were  called by telephone at the time of interpretation on 09/26/2020 at 5:15 pm to provider Dr. Vernell Leep, who verbally acknowledged these results. Electronically Signed   By: Ronney Asters M.D.   On: 09/26/2020 17:15   CT ABDOMEN PELVIS W CONTRAST  Result Date: 09/26/2020 CLINICAL DATA:  Fever of unknown origin. EXAM: CT CHEST, ABDOMEN, AND PELVIS WITH CONTRAST TECHNIQUE: Multidetector CT imaging of the chest, abdomen and pelvis was performed following the standard protocol during bolus administration of intravenous contrast. CONTRAST:  44mL OMNIPAQUE IOHEXOL 350 MG/ML SOLN COMPARISON:  Two views chest 09/26/2015. FINDINGS: CT CHEST FINDINGS Cardiovascular: Aorta is normal in size. There are atherosclerotic calcifications of the aorta and coronary arteries. Normal heart size. No pericardial effusion. Mediastinum/Nodes: There are scattered nonenlarged mediastinal and hilar lymph nodes. The esophagus is visualized thyroid gland are within normal limits. Lungs/Pleura: There is no pleural effusion or pneumothorax. There is linear atelectasis in the lung bases. There are multifocal tree-in-bud, ground-glass and ill-defined nodular densities in the bilateral upper lobes and left lower lobe compatible with infectious/inflammatory process. There also some minimal peripheral reticular opacities in both lung bases, nonspecific. 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 No acute fractures are seen. There is a prominent Schmorl's node along the inferior endplate of T55. right-sided diaphragmatic hernia is noted. This is unchanged from prior. CT ABDOMEN PELVIS FINDINGS Hepatobiliary: There is diffuse fatty infiltration of the liver. Gallbladder and bile ducts appear within normal  limits. Pancreas: There is a punctate calculus in the pancreatic head which can be seen with chronic pancreatitis. No pancreatic ductal dilatation or surrounding inflammatory changes. Spleen: Normal in size without focal abnormality. Adrenals/Urinary Tract: The bilateral adrenal glands, left kidney, and bladder are within normal limits. There is a 9 x 6 x 10 mm calculus at the ureterovesicular junction. There is moderate right-sided hydronephrosis. Air is seen within the right renal pelvis and collecting system worrisome for infection. There is some patchy areas of cortical hypodensity in the right kidney suspicious for pyelonephritis. There is right perinephric and Peri ureteral fat stranding. There is also enhancement and stranding surrounding the mid/distal ureter. No focal fluid collection to suggest abscess. Stomach/Bowel: Stomach is within normal limits. Appendix appears normal. No evidence of bowel wall thickening, distention, or inflammatory changes. There is sigmoid colon diverticulosis without evidence for acute diverticulitis. Vascular/Lymphatic: Aortic atherosclerosis. No enlarged abdominal or pelvic lymph nodes. Reproductive: There are dense calcifications in the prostate gland. The prostate is nonenlarged. Other: No abdominal wall hernia or abnormality. No abdominopelvic ascites. 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 There is no listhesis. There are degenerative changes at L5-S1. IMPRESSION: 1. Right-sided emphysematous pyelonephritis. There is air in the renal collecting system. Infection also involves the right ureter. 2. 10 mm calculus at the right ureteropelvic junction with moderate obstructive uropathy. 3. Scattered tree-in-bud, ground-glass and ill-defined nodular densities in the left lower lobe and bilateral upper lobes compatible with infectious/inflammatory process. Follow-up imaging recommended to confirm complete resolution. 4. Sigmoid colon  diverticulosis without evidence for diverticulitis. 5. Aortic Atherosclerosis (ICD10-I70.0). These results were called by telephone at the time of interpretation on 09/26/2020 at 5:15 pm to provider Dr. Vernell Leep, who verbally acknowledged these results. Electronically Signed   By: Ronney Asters M.D.   On: 09/26/2020 17:15   DG C-Arm 1-60 Min-No Report  Result Date: 09/27/2020 Fluoroscopy was utilized by the requesting physician.  No radiographic interpretation.      Assessment/Plan:  INTERVAL HISTORY: Patient was found to have  emphysematous pyelonephritis on the right with a ureteral calculus causing obstructive pathology and now is status post cystoscopy pyelogram and right JJ stent   Principal Problem:   Emphysematous pyelonephritis Active Problems:   Atrial fibrillation with RVR ([facility])   Sepsis ([place])   Sepsis due to gram-negative UTI ([place])   AKI (acute kidney injury) ([place])   Hypotension    Travis Howell is a 70 y.o. male with admission with urinary tract infection with septic physiology who was having ongoing fevers despite appropriate antibiotic therapy.  We obtained a CTA of the abdomen pelvis yesterday which showed that he had right-sided emphysematous pyelonephritis with obstructive stone.  He has now undergone cystoscopy with retrograde pyelogram and placement of JJ stent.  New culture seem to have been obtained in the operating room.  I have switched him back to cefazolin  #1 RIght sided thematous pyelonephritis with obstructive uropathy status post cystoscopy and stent placement:  Continue cefazolin for now.  As he improves with changing back to cefadroxil to complete 2 weeks total therapy.  I spent 37  minutes with the patient including face to face counseling of the patient re his emphysematous pyelonephritis  personally reviewing CT abdomen and pelvis along with updated blood urine cutlures, CBC CMP and along review of medical records before and during the  visit and in coordination of his care with Dr. Algis Liming.  Dr Gale Journey is available for questions this weekend.   LOS: 3 days   Alcide Evener 09/27/2020, 2:07 PM

## 2020-09-27 NOTE — Progress Notes (Signed)
  Echocardiogram 2D Echocardiogram has been performed.  Fidel Levy 09/27/2020, 2:29 PM

## 2020-09-27 NOTE — Anesthesia Postprocedure Evaluation (Signed)
Anesthesia Post Note  Patient: PHARRELL LEDFORD  Procedure(s) Performed: CYSTOSCOPY WITH STENT PLACEMENT (Right: Ureter)     Patient location during evaluation: PACU Anesthesia Type: General Level of consciousness: awake and alert Pain management: pain level controlled Vital Signs Assessment: post-procedure vital signs reviewed and stable Respiratory status: spontaneous breathing, nonlabored ventilation, respiratory function stable and patient connected to nasal cannula oxygen Cardiovascular status: blood pressure returned to baseline and stable Postop Assessment: no apparent nausea or vomiting Anesthetic complications: no   No notable events documented.  Last Vitals:  Vitals:   09/27/20 0845 09/27/20 0907  BP: 104/78 110/74  Pulse: 95 78  Resp: 20 18  Temp: 36.8 C 36.6 C  SpO2: 96% 99%    Last Pain:  Vitals:   09/27/20 0907  TempSrc: Oral  PainSc:                  [place]

## 2020-09-28 ENCOUNTER — Encounter (HOSPITAL_COMMUNITY): Payer: Self-pay | Admitting: Pharmacist

## 2020-09-28 ENCOUNTER — Encounter (HOSPITAL_COMMUNITY): Payer: Self-pay | Admitting: Urology

## 2020-09-28 DIAGNOSIS — I4891 Unspecified atrial fibrillation: Secondary | ICD-10-CM | POA: Diagnosis not present

## 2020-09-28 DIAGNOSIS — R6520 Severe sepsis without septic shock: Secondary | ICD-10-CM | POA: Diagnosis not present

## 2020-09-28 DIAGNOSIS — A419 Sepsis, unspecified organism: Secondary | ICD-10-CM | POA: Diagnosis not present

## 2020-09-28 DIAGNOSIS — N12 Tubulo-interstitial nephritis, not specified as acute or chronic: Secondary | ICD-10-CM | POA: Diagnosis not present

## 2020-09-28 LAB — CULTURE, BLOOD (ROUTINE X 2)
Culture: NO GROWTH
Culture: NO GROWTH

## 2020-09-28 LAB — BASIC METABOLIC PANEL
Anion gap: 9 (ref 5–15)
BUN: 18 mg/dL (ref 8–23)
CO2: 26 mmol/L (ref 22–32)
Calcium: 8.9 mg/dL (ref 8.9–10.3)
Chloride: 102 mmol/L (ref 98–111)
Creatinine, Ser: 0.77 mg/dL (ref 0.61–1.24)
GFR, Estimated: >60 mL/min (ref >60)
Glucose, Bld: 273 mg/dL — ABNORMAL HIGH (ref 70–99)
Potassium: 4.3 mmol/L (ref 3.5–5.1)
Sodium: 137 mmol/L (ref 135–145)

## 2020-09-28 LAB — GLUCOSE, CAPILLARY
Glucose-Capillary: 263 mg/dL — ABNORMAL HIGH (ref 70–99)
Glucose-Capillary: 252 mg/dL — ABNORMAL HIGH (ref 70–99)

## 2020-09-28 LAB — CBC
HCT: 31.4 % — ABNORMAL LOW (ref 39.0–52.0)
Hemoglobin: 10.9 g/dL — ABNORMAL LOW (ref 13.0–17.0)
MCH: 33.7 pg (ref 26.0–34.0)
MCHC: 34.7 g/dL (ref 30.0–36.0)
MCV: 97.2 fL (ref 80.0–100.0)
Platelets: 215 10*3/uL (ref 150–400)
RBC: 3.23 MIL/uL — ABNORMAL LOW (ref 4.22–5.81)
RDW: 12.5 % (ref 11.5–15.5)
WBC: 8.3 10*3/uL (ref 4.0–10.5)
nRBC: 0 % (ref 0.0–0.2)

## 2020-09-28 MED ORDER — CEFADROXIL 1 G PO TABS: 1.0000 g | ORAL_TABLET | Freq: Two times a day (BID) | ORAL | 0 refills | Status: AC

## 2020-09-28 MED ORDER — METOPROLOL SUCCINATE ER 25 MG PO TB24
75.0000 mg | ORAL_TABLET | Freq: Two times a day (BID) | ORAL | Status: DC
  Administered 2020-09-28: 75 mg via ORAL
  Filled 2020-09-28: qty 3

## 2020-09-28 MED ORDER — METOPROLOL SUCCINATE ER 50 MG PO TB24: 75.0000 mg | ORAL_TABLET | Freq: Two times a day (BID) | ORAL | 1 refills | Status: DC

## 2020-09-28 NOTE — Discharge Instructions (Signed)

## 2020-09-28 NOTE — Progress Notes (Addendum)
Progress Note  Patient Name: Travis Howell Date of Encounter: 09/28/2020  [facility] Cardiologist: Larae Grooms, MD   Subjective   No complaints  Inpatient Medications    Scheduled Meds:  apixaban  5 mg Oral BID   aspirin  81 mg Oral Daily   colchicine  0.6 mg Oral Daily   ezetimibe  10 mg Oral Daily   insulin aspart  0-9 Units Subcutaneous TID WC   metoprolol succinate  50 mg Oral BID   rosuvastatin  5 mg Oral Daily   tamsulosin  0.4 mg Oral QPC supper   Continuous Infusions:   ceFAZolin (ANCEF) IV 2 g (09/28/20 0650)   PRN Meds: acetaminophen **OR** acetaminophen, albuterol, senna-docusate   Vital Signs    Vitals:   09/27/20 1319 09/27/20 1420 09/27/20 2111 09/28/20 0450  BP: 102/74 108/78 114/85 (!) 142/103  Pulse: (!) 106 88 (!) 104 (!) 57  Resp: 16 16 17 20   Temp: (!) 97.5 F (36.4 C) 98.2 F (36.8 C) 97.6 F (36.4 C) (!) 97.5 F (36.4 C)  TempSrc: Oral Oral Oral Oral  SpO2: 94% 96% 93% 98%  Weight:      Height:        Intake/Output Summary (Last 24 hours) at 09/28/2020 0756 Last data filed at 09/28/2020 0700 Gross per 24 hour  Intake 920 ml  Output 1850 ml  Net -930 ml   Last 3 Weights 09/24/2020 09/23/2020 09/16/2020  Weight (lbs) 220 lb 10.9 oz 212 lb 219 lb 3.2 oz  Weight (kg) 100.1 kg 96.163 kg 99.428 kg  Some encounter information is confidential and restricted. Go to Review Flowsheets activity to see all data.      Telemetry    Afib variable rates - Personally Reviewed  ECG    N/a - Personally Reviewed  Physical Exam   GEN: No acute distress.   Neck: No JVD Cardiac: irreg, no murmurs, rubs, or gallops.  Respiratory: Clear to auscultation bilaterally. GI: Soft, nontender, non-distended  MS: No edema; No deformity. Neuro:  Nonfocal  Psych: Normal affect   Labs    High Sensitivity Troponin:  No results for input(s): TROPONINIHS in the last 720 hours.   Chemistry Recent Labs  Lab 09/23/20 1416 09/24/20 0432  09/25/20 0422 09/26/20 0445 09/27/20 0434 09/28/20 0442  NA 138 137 136  --  140 137  K 4.4 4.2 4.1  --  3.7 4.3  CL 98 102 103  --  102 102  CO2 26 23 23   --  26 26  GLUCOSE 229* 160* 169*  --  139* 273*  BUN 27* 24* 23  --  14 18  CREATININE 1.44* 1.19 0.89  --  0.87 0.77  CALCIUM 9.3 8.3* 8.4*  --  8.6* 8.9  MG  --   --   --  1.5* 1.7  --   PROT 7.8 6.4*  --   --   --   --   ALBUMIN 4.1 3.3*  --   --   --   --   AST 21 21  --   --   --   --   ALT 18 17  --   --   --   --   ALKPHOS 62 54  --   --   --   --   BILITOT 1.4* 1.0  --   --   --   --   GFRNONAA 53* >60 >60  --  >60 >60  ANIONGAP  14 12 10   --  12 9    Lipids No results for input(s): CHOL, TRIG, HDL, LABVLDL, LDLCALC, CHOLHDL in the last 168 hours.  Hematology Recent Labs  Lab 09/26/20 0445 09/27/20 0434 09/28/20 0442  WBC 8.6 6.2 8.3  RBC 3.24* 3.16* 3.23*  HGB 11.0* 10.6* 10.9*  HCT 31.4* 29.9* 31.4*  MCV 96.9 94.6 97.2  MCH 34.0 33.5 33.7  MCHC 35.0 35.5 34.7  RDW 12.5 12.3 12.5  PLT 143* 181 215   Thyroid  Recent Labs  Lab 09/27/20 0434  TSH 1.782    BNPNo results for input(s): BNP, PROBNP in the last 168 hours.  DDimer No results for input(s): DDIMER in the last 168 hours.   Radiology    CT CHEST W CONTRAST  Result Date: 09/26/2020 CLINICAL DATA:  Fever of unknown origin. EXAM: CT CHEST, ABDOMEN, AND PELVIS WITH CONTRAST TECHNIQUE: Multidetector CT imaging of the chest, abdomen and pelvis was performed following the standard protocol during bolus administration of intravenous contrast. CONTRAST:  41mL OMNIPAQUE IOHEXOL 350 MG/ML SOLN COMPARISON:  Two views chest 09/26/2015. FINDINGS: CT CHEST FINDINGS Cardiovascular: Aorta is normal in size. There are atherosclerotic calcifications of the aorta and coronary arteries. Normal heart size. No pericardial effusion. Mediastinum/Nodes: There are scattered nonenlarged mediastinal and hilar lymph nodes. The esophagus is visualized thyroid gland are within  normal limits. Lungs/Pleura: There is no pleural effusion or pneumothorax. There is linear atelectasis in the lung bases. There are multifocal tree-in-bud, ground-glass and ill-defined nodular densities in the bilateral upper lobes and left lower lobe compatible with infectious/inflammatory process. There also some minimal peripheral reticular opacities in both lung bases, nonspecific. 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 No acute fractures are seen. There is a prominent Schmorl's node along the inferior endplate of H63. right-sided diaphragmatic hernia is noted. This is unchanged from prior. CT ABDOMEN PELVIS FINDINGS Hepatobiliary: There is diffuse fatty infiltration of the liver. Gallbladder and bile ducts appear within normal limits. Pancreas: There is a punctate calculus in the pancreatic head which can be seen with chronic pancreatitis. No pancreatic ductal dilatation or surrounding inflammatory changes. Spleen: Normal in size without focal abnormality. Adrenals/Urinary Tract: The bilateral adrenal glands, left kidney, and bladder are within normal limits. There is a 9 x 6 x 10 mm calculus at the ureterovesicular junction. There is moderate right-sided hydronephrosis. Air is seen within the right renal pelvis and collecting system worrisome for infection. There is some patchy areas of cortical hypodensity in the right kidney suspicious for pyelonephritis. There is right perinephric and Peri ureteral fat stranding. There is also enhancement and stranding surrounding the mid/distal ureter. No focal fluid collection to suggest abscess. Stomach/Bowel: Stomach is within normal limits. Appendix appears normal. No evidence of bowel wall thickening, distention, or inflammatory changes. There is sigmoid colon diverticulosis without evidence for acute diverticulitis. Vascular/Lymphatic: Aortic atherosclerosis. No enlarged abdominal or pelvic lymph nodes. Reproductive: There are dense  calcifications in the prostate gland. The prostate is nonenlarged. Other: No abdominal wall hernia or abnormality. No abdominopelvic ascites. 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 There is no listhesis. There are degenerative changes at L5-S1. IMPRESSION: 1. Right-sided emphysematous pyelonephritis. There is air in the renal collecting system. Infection also involves the right ureter. 2. 10 mm calculus at the right ureteropelvic junction with moderate obstructive uropathy. 3. Scattered tree-in-bud, ground-glass and ill-defined nodular densities in the left lower lobe and bilateral upper lobes compatible with infectious/inflammatory process. Follow-up imaging recommended to confirm complete  resolution. 4. Sigmoid colon diverticulosis without evidence for diverticulitis. 5. Aortic Atherosclerosis (ICD10-I70.0). These results were called by telephone at the time of interpretation on 09/26/2020 at 5:15 pm to provider Dr. Vernell Leep, who verbally acknowledged these results. Electronically Signed   By: Ronney Asters M.D.   On: 09/26/2020 17:15   CT ABDOMEN PELVIS W CONTRAST  Result Date: 09/26/2020 CLINICAL DATA:  Fever of unknown origin. EXAM: CT CHEST, ABDOMEN, AND PELVIS WITH CONTRAST TECHNIQUE: Multidetector CT imaging of the chest, abdomen and pelvis was performed following the standard protocol during bolus administration of intravenous contrast. CONTRAST:  8mL OMNIPAQUE IOHEXOL 350 MG/ML SOLN COMPARISON:  Two views chest 09/26/2015. FINDINGS: CT CHEST FINDINGS Cardiovascular: Aorta is normal in size. There are atherosclerotic calcifications of the aorta and coronary arteries. Normal heart size. No pericardial effusion. Mediastinum/Nodes: There are scattered nonenlarged mediastinal and hilar lymph nodes. The esophagus is visualized thyroid gland are within normal limits. Lungs/Pleura: There is no pleural effusion or pneumothorax. There is linear atelectasis in the lung  bases. There are multifocal tree-in-bud, ground-glass and ill-defined nodular densities in the bilateral upper lobes and left lower lobe compatible with infectious/inflammatory process. There also some minimal peripheral reticular opacities in both lung bases, nonspecific. 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 No acute fractures are seen. There is a prominent Schmorl's node along the inferior endplate of W09. right-sided diaphragmatic hernia is noted. This is unchanged from prior. CT ABDOMEN PELVIS FINDINGS Hepatobiliary: There is diffuse fatty infiltration of the liver. Gallbladder and bile ducts appear within normal limits. Pancreas: There is a punctate calculus in the pancreatic head which can be seen with chronic pancreatitis. No pancreatic ductal dilatation or surrounding inflammatory changes. Spleen: Normal in size without focal abnormality. Adrenals/Urinary Tract: The bilateral adrenal glands, left kidney, and bladder are within normal limits. There is a 9 x 6 x 10 mm calculus at the ureterovesicular junction. There is moderate right-sided hydronephrosis. Air is seen within the right renal pelvis and collecting system worrisome for infection. There is some patchy areas of cortical hypodensity in the right kidney suspicious for pyelonephritis. There is right perinephric and Peri ureteral fat stranding. There is also enhancement and stranding surrounding the mid/distal ureter. No focal fluid collection to suggest abscess. Stomach/Bowel: Stomach is within normal limits. Appendix appears normal. No evidence of bowel wall thickening, distention, or inflammatory changes. There is sigmoid colon diverticulosis without evidence for acute diverticulitis. Vascular/Lymphatic: Aortic atherosclerosis. No enlarged abdominal or pelvic lymph nodes. Reproductive: There are dense calcifications in the prostate gland. The prostate is nonenlarged. Other: No abdominal wall hernia or abnormality. No  abdominopelvic ascites. 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 There is no listhesis. There are degenerative changes at L5-S1. IMPRESSION: 1. Right-sided emphysematous pyelonephritis. There is air in the renal collecting system. Infection also involves the right ureter. 2. 10 mm calculus at the right ureteropelvic junction with moderate obstructive uropathy. 3. Scattered tree-in-bud, ground-glass and ill-defined nodular densities in the left lower lobe and bilateral upper lobes compatible with infectious/inflammatory process. Follow-up imaging recommended to confirm complete resolution. 4. Sigmoid colon diverticulosis without evidence for diverticulitis. 5. Aortic Atherosclerosis (ICD10-I70.0). These results were called by telephone at the time of interpretation on 09/26/2020 at 5:15 pm to provider Dr. Vernell Leep, who verbally acknowledged these results. Electronically Signed   By: Ronney Asters M.D.   On: 09/26/2020 17:15   DG C-Arm 1-60 Min-No Report  Result Date: 09/27/2020 Fluoroscopy was utilized by the requesting physician.  No  radiographic interpretation.   ECHOCARDIOGRAM COMPLETE  Result Date: 09/27/2020    ECHOCARDIOGRAM REPORT   Patient Name:   Travis Howell [facility] Date of Exam: 09/27/2020 Medical Rec #:  [id number]        Height:       68.0 in Accession #:    [id number]       Weight:       220.7 lb Date of Birth:  1950/08/19        BSA:          2.131 m Patient Age:    17 years         BP:           133/87 mmHg Patient Gender: M                HR:           108 bpm. Exam Location:  Inpatient Procedure: 2D Echo, Cardiac Doppler and Color Doppler Indications:    Fever R50.9  History:        Patient has prior history of Echocardiogram examinations, most                 recent 08/07/2020. CAD and Previous Myocardial Infarction,                 Signs/Symptoms:Shortness of Breath; Risk Factors:Diabetes,                 Hypertension and Dyslipidemia.  Sonographer:    Bernadene Person RDCS Referring Phys: [place]  1. Left ventricular ejection fraction, by estimation, is 50 to 55%. The left ventricle has low normal function. The left ventricle has no regional wall motion abnormalities. Left ventricular diastolic function could not be evaluated.  2. Right ventricular systolic function is normal. The right ventricular size is normal. There is mildly elevated pulmonary artery systolic pressure.  3. Left atrial size was moderately dilated.  4. Right atrial size was moderately dilated.  5. The mitral valve is normal in structure. Trivial mitral valve regurgitation. No evidence of mitral stenosis.  6. The aortic valve is grossly normal. Aortic valve regurgitation is not visualized. No aortic stenosis is present.  7. The inferior vena cava is dilated in size with <50% respiratory variability, suggesting right atrial pressure of 15 mmHg. Comparison(s): Prior images reviewed side by side. Conclusion(s)/Recommendation(s): Otherwise normal echocardiogram, with minor abnormalities described in the report. FINDINGS  Left Ventricle: Left ventricular ejection fraction, by estimation, is 50 to 55%. The left ventricle has low normal function. The left ventricle has no regional wall motion abnormalities. The left ventricular internal cavity size was normal in size. There is no left ventricular hypertrophy. Left ventricular diastolic function could not be evaluated due to atrial fibrillation. Left ventricular diastolic function could not be evaluated. Right Ventricle: The right ventricular size is normal. Right vetricular wall thickness was not well visualized. Right ventricular systolic function is normal. There is mildly elevated pulmonary artery systolic pressure. The tricuspid regurgitant velocity  is 2.52 m/s, and with an assumed right atrial pressure of 15 mmHg, the estimated right ventricular systolic pressure is 46.5 mmHg. Left Atrium: Left atrial size was moderately  dilated. Right Atrium: Right atrial size was moderately dilated. Pericardium: There is no evidence of pericardial effusion. Presence of pericardial fat pad. Mitral Valve: The mitral valve is normal in structure. Trivial mitral valve regurgitation. No evidence of mitral valve stenosis. Tricuspid Valve: The tricuspid valve is normal in structure. Tricuspid  valve regurgitation is trivial. No evidence of tricuspid stenosis. Aortic Valve: The aortic valve is grossly normal. Aortic valve regurgitation is not visualized. No aortic stenosis is present. Pulmonic Valve: The pulmonic valve was not well visualized. Pulmonic valve regurgitation is trivial. No evidence of pulmonic stenosis. Aorta: The aortic root, ascending aorta, aortic arch and descending aorta are all structurally normal, with no evidence of dilitation or obstruction. Venous: The inferior vena cava is dilated in size with less than 50% respiratory variability, suggesting right atrial pressure of 15 mmHg. IAS/Shunts: The atrial septum is grossly normal.  LEFT VENTRICLE PLAX 2D LVIDd:         4.20 cm LVIDs:         3.00 cm LV PW:         1.00 cm LV IVS:        1.10 cm LVOT diam:     2.00 cm LV SV:         48 LV SV Index:   23 LVOT Area:     3.14 cm  LV Volumes (MOD) LV vol d, MOD A2C: 87.7 ml LV vol d, MOD A4C: 99.4 ml LV vol s, MOD A2C: 44.3 ml LV vol s, MOD A4C: 51.4 ml LV SV MOD A2C:     43.4 ml LV SV MOD A4C:     99.4 ml LV SV MOD BP:      47.1 ml RIGHT VENTRICLE RV S prime:     12.10 cm/s TAPSE (M-mode): 1.7 cm LEFT ATRIUM             Index       RIGHT ATRIUM           Index LA diam:        4.90 cm 2.30 cm/m  RA Area:     20.00 cm LA Vol (A2C):   58.8 ml 27.59 ml/m RA Volume:   63.10 ml  29.61 ml/m LA Vol (A4C):   67.6 ml 31.72 ml/m LA Biplane Vol: 64.9 ml 30.45 ml/m  AORTIC VALVE LVOT Vmax:   87.40 cm/s LVOT Vmean:  60.700 cm/s LVOT VTI:    0.153 m  AORTA Ao Root diam: 3.30 cm Ao Asc diam:  3.00 cm MITRAL VALVE               TRICUSPID VALVE MV Area  (PHT): 2.41 cm    TR Peak grad:   25.4 mmHg MV Decel Time: 315 msec    TR Vmax:        252.00 cm/s MV E velocity: 69.40 cm/s MV A velocity: 71.60 cm/s  SHUNTS MV E/A ratio:  0.97        Systemic VTI:  0.15 m                            Systemic Diam: 2.00 cm [name] MD Electronically signed by [name] MD Signature Date/Time: 09/27/2020/4:49:48 PM    Final     Cardiac Studies   09/2020 echo IMPRESSIONS     1. Left ventricular ejection fraction, by estimation, is 50 to 55%. The  left ventricle has low normal function. The left ventricle has no regional  wall motion abnormalities. Left ventricular diastolic function could not  be evaluated.   2. Right ventricular systolic function is normal. The right ventricular  size is normal. There is mildly elevated pulmonary artery systolic  pressure.   3. Left atrial size was moderately dilated.  4. Right atrial size was moderately dilated.   5. The mitral valve is normal in structure. Trivial mitral valve  regurgitation. No evidence of mitral stenosis.   6. The aortic valve is grossly normal. Aortic valve regurgitation is not  visualized. No aortic stenosis is present.   7. The inferior vena cava is dilated in size with <50% respiratory  variability, suggesting right atrial pressure of 15 mmHg.   Patient Profile     70 y.o. male with a PMH of s/p PCI/DES to LAD in 2012, persistent atrial fibrillation, HTN, HLD, DM type II, osteoarthritis who was admitted with sepsis due to UTI. He is being followed by cardiology for the evaluation of atrial fibrillation with RVR.  Assessment & Plan    Persistent afib with RVR -recent DCCV 08/28/20, reverted back to afib shortly after with plans for outpatient EP evaluation to consider ablation - admitted with sepsis, in this setting issues with afib with RVR - he is on eliquis - currently on torpol 50mg  bid, bp's are fine, HRs are occasionally elevated. Increase toprol to 75mg  bid.  Rates should impove as sepsis/systemic illness improves  - spoke with urology, potential need for additional procedures in the next few weeks and would need to come off eliquis. From cardiac standpoint he is over 1 month out from recent cardioversion, if needed can hold eliquis 48 hrs prior and resume day after procedure. We can postpone any potential ablation until after urology procedures have concluded as he would need to be on anticoag following ablation uninterrupted.   2.CAD -history of DES to LAD in 2012 - from notes has been on eliquis with ASA per his primary cardiologist, continue at this time  3. Sepsis with UTI/pyelonephritis - per primary team  For questions or updates, please contact [place] Please consult [email or web address] for contact info under        Signed, Carlyle Dolly, MD  09/28/2020, 7:56 AM

## 2020-09-28 NOTE — Progress Notes (Signed)
Urology Inpatient Progress Report  Sepsis ([place]) [A41.9] Severe sepsis ([place]) [A41.9, R65.20] Sepsis due to gram-negative UTI ([place]) [A41.50, N39.0]  Procedure(s): CYSTOSCOPY WITH STENT PLACEMENT  1 Day Post-Op   Intv/Subj: No acute events overnight. Patient is without complaint.  Principal Problem:   Emphysematous pyelonephritis Active Problems:   Atrial fibrillation with RVR (HCC)   Sepsis ([place])   Sepsis due to gram-negative UTI (HCC)   AKI (acute kidney injury) ([place])   Hypotension  Current Facility-Administered Medications  Medication Dose Route Frequency Provider Last Rate Last Admin   acetaminophen (TYLENOL) tablet 650 mg  650 mg Oral Q6H PRN [name], MD   650 mg at 09/26/20 1714   Or   acetaminophen (TYLENOL) suppository 650 mg  650 mg Rectal Q6H PRN [name], MD       albuterol (VENTOLIN HFA) 108 (90 Base) MCG/ACT inhaler 1-2 puff  1-2 puff Inhalation Q4H PRN [name], MD   2 puff at 09/27/20 1606   apixaban (ELIQUIS) tablet 5 mg  5 mg Oral BID [name], MD   5 mg at 09/27/20 2134   aspirin chewable tablet 81 mg  81 mg Oral Daily Harold Barban B, MD   81 mg at 09/27/20 1000   ceFAZolin (ANCEF) IVPB 2g/100 mL premix  2 g Intravenous Q8H Harold Barban B, MD 200 mL/hr at 09/28/20 0650 2 g at 09/28/20 0650   colchicine tablet 0.6 mg  0.6 mg Oral Daily Harold Barban B, MD   0.6 mg at 09/27/20 0959   ezetimibe (ZETIA) tablet 10 mg  10 mg Oral Daily Harold Barban B, MD   10 mg at 09/27/20 1000   insulin aspart (novoLOG) injection 0-9 Units  0-9 Units Subcutaneous TID WC [name], MD   7 Units at 09/27/20 1617   metoprolol succinate (TOPROL-XL) 24 hr tablet 75 mg  75 mg Oral BID Arnoldo Lenis, MD       rosuvastatin (CRESTOR) tablet 5 mg  5 mg Oral Daily Harold Barban B, MD   5 mg at 09/27/20 1000   senna-docusate (Senokot-S) tablet 1 tablet  1 tablet Oral QHS PRN [name], MD       tamsulosin (FLOMAX) capsule  0.4 mg  0.4 mg Oral QPC supper Harold Barban B, MD   0.4 mg at 09/27/20 1700     Objective: Vital: Vitals:   09/27/20 1319 09/27/20 1420 09/27/20 2111 09/28/20 0450  BP: 102/74 108/78 114/85 (!) 142/103  Pulse: (!) 106 88 (!) 104 (!) 57  Resp: 16 16 17 20   Temp: (!) 97.5 F (36.4 C) 98.2 F (36.8 C) 97.6 F (36.4 C) (!) 97.5 F (36.4 C)  TempSrc: Oral Oral Oral Oral  SpO2: 94% 96% 93% 98%  Weight:      Height:       I/Os: I/O last 3 completed shifts: In: 1020 [P.O.:120; I.V.:600; IV Piggyback:300] Out: 6160 [Urine:3325]  Physical Exam:  General: Patient is in no apparent distress Lungs: Normal respiratory effort, chest expands symmetrically. GI: the abdomen is soft and nontender without mass. Ext: lower extremities symmetric  Lab Results: Recent Labs    09/26/20 0445 09/27/20 0434 09/28/20 0442  WBC 8.6 6.2 8.3  HGB 11.0* 10.6* 10.9*  HCT 31.4* 29.9* 31.4*   Recent Labs    09/27/20 0434 09/28/20 0442  NA 140 137  K 3.7 4.3  CL 102 102  CO2 26 26  GLUCOSE 139* 273*  BUN 14  18  CREATININE 0.87 0.77  CALCIUM 8.6* 8.9   No results for input(s): LABPT, INR in the last 72 hours. No results for input(s): LABURIN in the last 72 hours. Results for orders placed or performed during the hospital encounter of 09/23/20  Resp Panel by RT-PCR (Flu A&B, Covid) Nasopharyngeal Swab     Status: None   Collection Time: 09/23/20  3:41 PM   Specimen: Nasopharyngeal Swab; Nasopharyngeal(NP) swabs in vial transport medium  Result Value Ref Range Status   SARS Coronavirus 2 by RT PCR NEGATIVE NEGATIVE Final    Comment: (NOTE) SARS-CoV-2 target nucleic acids are NOT DETECTED.  The SARS-CoV-2 RNA is generally detectable in upper respiratory specimens during the acute phase of infection. The lowest concentration of SARS-CoV-2 viral copies this assay can detect is 138 copies/mL. A negative result does not preclude SARS-Cov-2 infection and should not be used as the sole  basis for treatment or other patient management decisions. A negative result may occur with  improper specimen collection/handling, submission of specimen other than nasopharyngeal swab, presence of viral mutation(s) within the areas targeted by this assay, and inadequate number of viral copies(<138 copies/mL). A negative result must be combined with clinical observations, patient history, and epidemiological information. The expected result is Negative.  Fact Sheet for Patients:  EntrepreneurPulse.com.au  Fact Sheet for Healthcare Providers:  IncredibleEmployment.be  This test is no t yet approved or cleared by the [place] FDA and  has been authorized for detection and/or diagnosis of SARS-CoV-2 by FDA under an Emergency Use Authorization (EUA). This EUA will remain  in effect (meaning this test can be used) for the duration of the COVID-19 declaration under Section 564(b)(1) of the Act, 21 U.S.C.section 360bbb-3(b)(1), unless the authorization is terminated  or revoked sooner.       Influenza A by PCR NEGATIVE NEGATIVE Final   Influenza B by PCR NEGATIVE NEGATIVE Final    Comment: (NOTE) The Xpert Xpress SARS-CoV-2/FLU/RSV plus assay is intended as an aid in the diagnosis of influenza from Nasopharyngeal swab specimens and should not be used as a sole basis for treatment. Nasal washings and aspirates are unacceptable for Xpert Xpress SARS-CoV-2/FLU/RSV testing.  Fact Sheet for Patients: EntrepreneurPulse.com.au  Fact Sheet for Healthcare Providers: IncredibleEmployment.be  This test is not yet approved or cleared by the [place] FDA and has been authorized for detection and/or diagnosis of SARS-CoV-2 by FDA under an Emergency Use Authorization (EUA). This EUA will remain in effect (meaning this test can be used) for the duration of the COVID-19 declaration under Section 564(b)(1) of the Act,  21 U.S.C. section 360bbb-3(b)(1), unless the authorization is terminated or revoked.  Performed at [facility], [place] [address] [place], [place] [address]   Blood Culture (routine x 2)     Status: None   Collection Time: 09/23/20  3:41 PM   Specimen: BLOOD  Result Value Ref Range Status   Specimen Description   Final    BLOOD LEFT ANTECUBITAL Performed at [facility] [address] [place] [address] [place], [place] [address]    Special Requests   Final    BOTTLES DRAWN AEROBIC AND ANAEROBIC Blood Culture results may not be optimal due to an excessive volume of blood received in culture bottles Performed at [facility] [address], [place], [place] [address]    Culture   Final    NO GROWTH 5 DAYS Performed at [facility], [place] [address] [place], [place]  [address]    Report Status 09/28/2020 FINAL  Final  Urine Culture     Status: Abnormal   Collection Time: 09/23/20  3:59 PM   Specimen: Urine, Clean Catch  Result Value Ref Range Status   Specimen Description   Final    URINE, CLEAN CATCH Performed at [facility], [place] [address], [place], [place] [address]    Special Requests   Final    NONE Performed at [facility], [place] [address], [place], [place] [address]    Culture >=100,000 COLONIES/mL ESCHERICHIA COLI (A)  Final   Report Status 09/25/2020 FINAL  Final   Organism ID, Bacteria ESCHERICHIA COLI (A)  Final      Susceptibility   Escherichia coli - MIC*    AMPICILLIN 8 SENSITIVE Sensitive     CEFAZOLIN <=4 SENSITIVE Sensitive     CEFEPIME <=0.12 SENSITIVE Sensitive     CEFTRIAXONE <=0.25 SENSITIVE Sensitive     CIPROFLOXACIN 0.5 SENSITIVE Sensitive     GENTAMICIN <=1 SENSITIVE Sensitive     IMIPENEM <=0.25 SENSITIVE Sensitive     NITROFURANTOIN <=16 SENSITIVE Sensitive     TRIMETH/SULFA <=20 SENSITIVE Sensitive     AMPICILLIN/SULBACTAM 4 SENSITIVE Sensitive     PIP/TAZO  <=4 SENSITIVE Sensitive     * >=100,000 COLONIES/mL ESCHERICHIA COLI  Blood Culture (routine x 2)     Status: None   Collection Time: 09/23/20  5:41 PM   Specimen: BLOOD  Result Value Ref Range Status   Specimen Description   Final    BLOOD RIGHT ANTECUBITAL Performed at [facility] [address], [place], [place] [address]    Special Requests   Final    BOTTLES DRAWN AEROBIC AND ANAEROBIC Blood Culture results may not be optimal due to an excessive volume of blood received in culture bottles Performed at [facility] [address], [place], [place] [address]    Culture   Final    NO GROWTH 5 DAYS Performed at [facility], [place] [address], [place], [place] [address]    Report Status 09/28/2020 FINAL  Final  Surgical pcr screen     Status: None   Collection Time: 09/27/20  5:35 AM   Specimen: Nasal Mucosa; Nasal Swab  Result Value Ref Range Status   MRSA, PCR NEGATIVE NEGATIVE Final   Staphylococcus aureus NEGATIVE NEGATIVE Final    Comment: (NOTE) The Xpert SA Assay (FDA approved for NASAL specimens in patients 74 years of age and older), is one component of a comprehensive surveillance program. It is not intended to diagnose infection nor to guide or monitor treatment. Performed at [facility], [place] [address], [place], [place] [address]   Aerobic/Anaerobic Culture w Gram Stain (surgical/deep wound)     Status: None (Preliminary result)   Collection Time: 09/27/20  7:51 AM   Specimen: PATH Other; Tissue  Result Value Ref Range Status   Specimen Description   Final    URINE, RANDOM RENAL PELVIS Performed at [facility] [address], [place], [place] [address]    Special Requests   Final    RIGHT URETERAL STONE Performed at [facility] [address], [place], [place] [address]    Gram Stain NO WBC SEEN NO ORGANISMS SEEN   Final   Culture   Final    NO GROWTH  < 24 HOURS Performed at [facility], [place] [address], [place], [place]  [address]    Report Status PENDING  Incomplete    Studies/Results: CT CHEST W CONTRAST  Result Date: 09/26/2020 CLINICAL DATA:  Fever of unknown origin. EXAM: CT CHEST, ABDOMEN, AND PELVIS WITH CONTRAST TECHNIQUE: Multidetector CT imaging of the chest, abdomen and pelvis was performed following the standard protocol during bolus administration of intravenous contrast. CONTRAST:  17mL OMNIPAQUE IOHEXOL 350 MG/ML SOLN COMPARISON:  Two views chest 09/26/2015. FINDINGS: CT CHEST FINDINGS Cardiovascular: Aorta is normal in size. There are atherosclerotic calcifications of the aorta and coronary arteries. Normal heart size. No pericardial effusion. Mediastinum/Nodes: There are scattered nonenlarged mediastinal and hilar lymph nodes. The esophagus is visualized thyroid gland are within normal limits. Lungs/Pleura: There is no pleural effusion or pneumothorax. There is linear atelectasis in the lung bases. There are multifocal tree-in-bud, ground-glass and ill-defined nodular densities in the bilateral upper lobes and left lower lobe compatible with infectious/inflammatory process. There also some minimal peripheral reticular opacities in both lung bases, nonspecific. 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 No acute fractures are seen. There is a prominent Schmorl's node along the inferior endplate of U04. right-sided diaphragmatic hernia is noted. This is unchanged from prior. CT ABDOMEN PELVIS FINDINGS Hepatobiliary: There is diffuse fatty infiltration of the liver. Gallbladder and bile ducts appear within normal limits. Pancreas: There is a punctate calculus in the pancreatic head which can be seen with chronic pancreatitis. No pancreatic ductal dilatation or surrounding inflammatory changes. Spleen: Normal in size without focal abnormality. Adrenals/Urinary Tract: The bilateral adrenal glands, left kidney, and  bladder are within normal limits. There is a 9 x 6 x 10 mm calculus at the ureterovesicular junction. There is moderate right-sided hydronephrosis. Air is seen within the right renal pelvis and collecting system worrisome for infection. There is some patchy areas of cortical hypodensity in the right kidney suspicious for pyelonephritis. There is right perinephric and Peri ureteral fat stranding. There is also enhancement and stranding surrounding the mid/distal ureter. No focal fluid collection to suggest abscess. Stomach/Bowel: Stomach is within normal limits. Appendix appears normal. No evidence of bowel wall thickening, distention, or inflammatory changes. There is sigmoid colon diverticulosis without evidence for acute diverticulitis. Vascular/Lymphatic: Aortic atherosclerosis. No enlarged abdominal or pelvic lymph nodes. Reproductive: There are dense calcifications in the prostate gland. The prostate is nonenlarged. Other: No abdominal wall hernia or abnormality. No abdominopelvic ascites. 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 There is no listhesis. There are degenerative changes at L5-S1. IMPRESSION: 1. Right-sided emphysematous pyelonephritis. There is air in the renal collecting system. Infection also involves the right ureter. 2. 10 mm calculus at the right ureteropelvic junction with moderate obstructive uropathy. 3. Scattered tree-in-bud, ground-glass and ill-defined nodular densities in the left lower lobe and bilateral upper lobes compatible with infectious/inflammatory process. Follow-up imaging recommended to confirm complete resolution. 4. Sigmoid colon diverticulosis without evidence for diverticulitis. 5. Aortic Atherosclerosis (ICD10-I70.0). These results were called by telephone at the time of interpretation on 09/26/2020 at 5:15 pm to provider Dr. Vernell Leep, who verbally acknowledged these results. Electronically Signed   By: Ronney Asters M.D.   On:  09/26/2020 17:15   CT ABDOMEN PELVIS W CONTRAST  Result Date: 09/26/2020 CLINICAL DATA:  Fever of unknown origin. EXAM: CT CHEST, ABDOMEN, AND PELVIS WITH CONTRAST TECHNIQUE: Multidetector CT imaging of the chest, abdomen and pelvis was performed following the standard protocol during bolus administration of intravenous contrast. CONTRAST:  30mL OMNIPAQUE IOHEXOL 350 MG/ML SOLN COMPARISON:  Two views chest 09/26/2015. FINDINGS:  CT CHEST FINDINGS Cardiovascular: Aorta is normal in size. There are atherosclerotic calcifications of the aorta and coronary arteries. Normal heart size. No pericardial effusion. Mediastinum/Nodes: There are scattered nonenlarged mediastinal and hilar lymph nodes. The esophagus is visualized thyroid gland are within normal limits. Lungs/Pleura: There is no pleural effusion or pneumothorax. There is linear atelectasis in the lung bases. There are multifocal tree-in-bud, ground-glass and ill-defined nodular densities in the bilateral upper lobes and left lower lobe compatible with infectious/inflammatory process. There also some minimal peripheral reticular opacities in both lung bases, nonspecific. 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 No acute fractures are seen. There is a prominent Schmorl's node along the inferior endplate of W25. right-sided diaphragmatic hernia is noted. This is unchanged from prior. CT ABDOMEN PELVIS FINDINGS Hepatobiliary: There is diffuse fatty infiltration of the liver. Gallbladder and bile ducts appear within normal limits. Pancreas: There is a punctate calculus in the pancreatic head which can be seen with chronic pancreatitis. No pancreatic ductal dilatation or surrounding inflammatory changes. Spleen: Normal in size without focal abnormality. Adrenals/Urinary Tract: The bilateral adrenal glands, left kidney, and bladder are within normal limits. There is a 9 x 6 x 10 mm calculus at the ureterovesicular junction. There is moderate  right-sided hydronephrosis. Air is seen within the right renal pelvis and collecting system worrisome for infection. There is some patchy areas of cortical hypodensity in the right kidney suspicious for pyelonephritis. There is right perinephric and Peri ureteral fat stranding. There is also enhancement and stranding surrounding the mid/distal ureter. No focal fluid collection to suggest abscess. Stomach/Bowel: Stomach is within normal limits. Appendix appears normal. No evidence of bowel wall thickening, distention, or inflammatory changes. There is sigmoid colon diverticulosis without evidence for acute diverticulitis. Vascular/Lymphatic: Aortic atherosclerosis. No enlarged abdominal or pelvic lymph nodes. Reproductive: There are dense calcifications in the prostate gland. The prostate is nonenlarged. Other: No abdominal wall hernia or abnormality. No abdominopelvic ascites. 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 There is no listhesis. There are degenerative changes at L5-S1. IMPRESSION: 1. Right-sided emphysematous pyelonephritis. There is air in the renal collecting system. Infection also involves the right ureter. 2. 10 mm calculus at the right ureteropelvic junction with moderate obstructive uropathy. 3. Scattered tree-in-bud, ground-glass and ill-defined nodular densities in the left lower lobe and bilateral upper lobes compatible with infectious/inflammatory process. Follow-up imaging recommended to confirm complete resolution. 4. Sigmoid colon diverticulosis without evidence for diverticulitis. 5. Aortic Atherosclerosis (ICD10-I70.0). These results were called by telephone at the time of interpretation on 09/26/2020 at 5:15 pm to provider Dr. Vernell Leep, who verbally acknowledged these results. Electronically Signed   By: Ronney Asters M.D.   On: 09/26/2020 17:15   DG C-Arm 1-60 Min-No Report  Result Date: 09/27/2020 Fluoroscopy was utilized by the requesting  physician.  No radiographic interpretation.   ECHOCARDIOGRAM COMPLETE  Result Date: 09/27/2020    ECHOCARDIOGRAM REPORT   Patient Name:   DEMARR KLUEVER [facility] Date of Exam: 09/27/2020 Medical Rec #:  [id number]        Height:       68.0 in Accession #:    [id number]       Weight:       220.7 lb Date of Birth:  [birth year]        BSA:          2.131 m Patient Age:    37 years         BP:  133/87 mmHg Patient Gender: M                HR:           108 bpm. Exam Location:  Inpatient Procedure: 2D Echo, Cardiac Doppler and Color Doppler Indications:    Fever R50.9  History:        Patient has prior history of Echocardiogram examinations, most                 recent 08/07/2020. CAD and Previous Myocardial Infarction,                 Signs/Symptoms:Shortness of Breath; Risk Factors:Diabetes,                 Hypertension and Dyslipidemia.  Sonographer:    Bernadene Person RDCS Referring Phys: [place]  1. Left ventricular ejection fraction, by estimation, is 50 to 55%. The left ventricle has low normal function. The left ventricle has no regional wall motion abnormalities. Left ventricular diastolic function could not be evaluated.  2. Right ventricular systolic function is normal. The right ventricular size is normal. There is mildly elevated pulmonary artery systolic pressure.  3. Left atrial size was moderately dilated.  4. Right atrial size was moderately dilated.  5. The mitral valve is normal in structure. Trivial mitral valve regurgitation. No evidence of mitral stenosis.  6. The aortic valve is grossly normal. Aortic valve regurgitation is not visualized. No aortic stenosis is present.  7. The inferior vena cava is dilated in size with <50% respiratory variability, suggesting right atrial pressure of 15 mmHg. Comparison(s): Prior images reviewed side by side. Conclusion(s)/Recommendation(s): Otherwise normal echocardiogram, with minor abnormalities described in the report. FINDINGS   Left Ventricle: Left ventricular ejection fraction, by estimation, is 50 to 55%. The left ventricle has low normal function. The left ventricle has no regional wall motion abnormalities. The left ventricular internal cavity size was normal in size. There is no left ventricular hypertrophy. Left ventricular diastolic function could not be evaluated due to atrial fibrillation. Left ventricular diastolic function could not be evaluated. Right Ventricle: The right ventricular size is normal. Right vetricular wall thickness was not well visualized. Right ventricular systolic function is normal. There is mildly elevated pulmonary artery systolic pressure. The tricuspid regurgitant velocity  is 2.52 m/s, and with an assumed right atrial pressure of 15 mmHg, the estimated right ventricular systolic pressure is 77.9 mmHg. Left Atrium: Left atrial size was moderately dilated. Right Atrium: Right atrial size was moderately dilated. Pericardium: There is no evidence of pericardial effusion. Presence of pericardial fat pad. Mitral Valve: The mitral valve is normal in structure. Trivial mitral valve regurgitation. No evidence of mitral valve stenosis. Tricuspid Valve: The tricuspid valve is normal in structure. Tricuspid valve regurgitation is trivial. No evidence of tricuspid stenosis. Aortic Valve: The aortic valve is grossly normal. Aortic valve regurgitation is not visualized. No aortic stenosis is present. Pulmonic Valve: The pulmonic valve was not well visualized. Pulmonic valve regurgitation is trivial. No evidence of pulmonic stenosis. Aorta: The aortic root, ascending aorta, aortic arch and descending aorta are all structurally normal, with no evidence of dilitation or obstruction. Venous: The inferior vena cava is dilated in size with less than 50% respiratory variability, suggesting right atrial pressure of 15 mmHg. IAS/Shunts: The atrial septum is grossly normal.  LEFT VENTRICLE PLAX 2D LVIDd:         4.20 cm LVIDs:  3.00 cm LV PW:         1.00 cm LV IVS:        1.10 cm LVOT diam:     2.00 cm LV SV:         48 LV SV Index:   23 LVOT Area:     3.14 cm  LV Volumes (MOD) LV vol d, MOD A2C: 87.7 ml LV vol d, MOD A4C: 99.4 ml LV vol s, MOD A2C: 44.3 ml LV vol s, MOD A4C: 51.4 ml LV SV MOD A2C:     43.4 ml LV SV MOD A4C:     99.4 ml LV SV MOD BP:      47.1 ml RIGHT VENTRICLE RV S prime:     12.10 cm/s TAPSE (M-mode): 1.7 cm LEFT ATRIUM             Index       RIGHT ATRIUM           Index LA diam:        4.90 cm 2.30 cm/m  RA Area:     20.00 cm LA Vol (A2C):   58.8 ml 27.59 ml/m RA Volume:   63.10 ml  29.61 ml/m LA Vol (A4C):   67.6 ml 31.72 ml/m LA Biplane Vol: 64.9 ml 30.45 ml/m  AORTIC VALVE LVOT Vmax:   87.40 cm/s LVOT Vmean:  60.700 cm/s LVOT VTI:    0.153 m  AORTA Ao Root diam: 3.30 cm Ao Asc diam:  3.00 cm MITRAL VALVE               TRICUSPID VALVE MV Area (PHT): 2.41 cm    TR Peak grad:   25.4 mmHg MV Decel Time: 315 msec    TR Vmax:        252.00 cm/s MV E velocity: 69.40 cm/s MV A velocity: 71.60 cm/s  SHUNTS MV E/A ratio:  0.97        Systemic VTI:  0.15 m                            Systemic Diam: 2.00 cm [name] MD Electronically signed by [name] MD Signature Date/Time: 09/27/2020/4:49:48 PM    Final     Assessment: Procedure(s): CYSTOSCOPY WITH STENT PLACEMENT, 1 Day Post-Op  doing well.  Emphysematous e.coli pyelo due to obstructing ureteral stone.  Fortunately he is not bacteremic.  Chronic afib exacerbated by infection.  Hemodynamically stablizing, HR down this AM, afebrile.  Plan: Culture specific abx for 2 wks Will need ureteroscopy w/ Dahlstedt in 2-3 weeks, appreciate Cardiology's input into brief pause in anticoagulation for his procedure. Spoke with patient about importance of maintaining adequate fluid intake to minimize hematuria with stent while on eliquis.  Will see patient PRN at this point.   Travis Meckel, MD Urology 09/28/2020, 8:31 AM

## 2020-09-28 NOTE — Discharge Summary (Signed)
Physician Discharge Summary  JONDAVID SCHREIER PJK:[id number] DOB: [birth year]  PCP: [name], MD  Admitted from: Home Discharged to: Home  Admit date: 09/23/2020 Discharge date: 09/28/2020  Recommendations for Outpatient Follow-up:    Follow-up Information     [name], MD. Schedule an appointment as soon as possible for a visit in 1 week(s).   Specialty: Family Medicine Why: To be seen with repeat labs (CBC & BMP). Contact information: [place] [address] [phone]         Jettie Booze, MD .   Specialties: Cardiology, Radiology, Interventional Cardiology Contact information: [address] [place] [address] [phone]         [name], MD. Schedule an appointment as soon as possible for a visit in 2 week(s).   Specialty: Urology Contact information: [place] [address] [phone]         [name], MD Follow up on 10/28/2020.   Specialty: Cardiology Why: 10:15 AM. keep prior appointment. Contact information: [place] [address] [phone]                  Home Health: None    Equipment/Devices: None    Discharge Condition: Improved and stable   Code Status: Full Code Diet recommendation:  Discharge Diet Orders (From admission, onward)     Start     Ordered   09/28/20 0000  Diet - low sodium heart healthy        09/28/20 1114   09/28/20 0000  Diet Carb Modified        09/28/20 1114             Discharge Diagnoses:  Principal Problem:   Emphysematous pyelonephritis Active Problems:   Atrial fibrillation with RVR (HCC)   Sepsis ([facility])   Sepsis due to gram-negative UTI ([facility])   AKI (acute kidney injury) ([facility])   Hypotension   Brief Summary: 70 year old male with medical history significant for BPH (follows with Dr. [name], Urology), CAD s/p prior MI and stent, hypertension, type II  DM, hyperlipidemia, A. fib on Eliquis, UTIs, presented with complaints of nausea, vomiting and feeling weak.  He also reported 4 days history of hematuria, difficulty urinating and some dysuria.  He was seen by Dr. [name] in the office, was feeling poorly, about to pass out, hypotensive, nausea and vomiting and hence was directed to the ED.  Admitted for sepsis due to E. coli acute right emphysematous pyelonephritis due to obstructing ureteral calculi, A. fib with RVR and acute kidney injury.  Course complicated by hypotension.  Cardiology consulted.  Due to persisting fevers despite appropriate IV antibiotics, consulted ID.  Urology consulted, underwent cystoscopy with right ureteral stent placement 9/23.     Assessment & Plan:    Severe sepsis (POA) due to E. coli acute right emphysematous pyelonephritis due to obstructing ureteral calculi/recurrent UTI-patient met criteria for sepsis with intermittent hypotension, tachycardia, tachypnea and a source.  Urine microscopy consistent with UTI.  Urine culture: Pansensitive E. coli.  Blood cultures x2: Negative to date.  E. coli UTI in June 2021, pansensitive except to levofloxacin.  Despite IV ceftriaxone/appropriate sensitivity, had ongoing high fevers.  ID consulted.  Obtained CT chest abdomen and pelvis which revealed emphysematous right pyelonephritis with obstructing UPJ stone.  Urology was consulted.  9/23, underwent cystoscopy, retrograde pyelogram and placement of JJ stent.  New cultures were sent at procedure/urine culture  from renal pelvis negative to date.  Briefly on IV Zosyn, ID has switched to cefazolin.  Clinically improved.  Sepsis resolved.  As per ID signed off yesterday, changed back to cefadroxil to complete a total of 14 days treatment.   Right-sided emphysematous pyelonephritis with obstructive uropathy due to right UPJ stone: S/p cystoscopy and stent placement by urology.  Management per urology.  As per urology follow-up today, he will  need ureteroscopy in 2 to 3 weeks and Cardiology is okay with brief pause and anticoagulation for procedure.  Outpatient follow-up with neurology.   Acute kidney injury-in the setting of sepsis, intermittent obstructive symptoms at home.  Exforge was held in the hospital and continue to hold at discharge due to recent AKI, increasing dose of Toprol-XL and risk for hypotension.  Follow closely as outpatient and consider need to resume.  A. fib with RVR-driven by sepsis.  Per chart review, complicated A. fib, failed cardioversion, not a candidate for flecainide due to CAD, amiodarone with long-term side effects.  Has upcoming EP cardiology appointment to consider ablation.  Due to difficulty in getting his A. fib under control, Cardiology was consulted and have been titrating meds.  He was on metoprolol 25 Mg 4 times daily and diltiazem 30 mg 3 times daily.  Cardiology has consolidated medications to Toprol-XL dose of which was further increased today to 75 Mg twice daily.  I discussed with Dr. Harl Bowie he has cleared patient for discharge home.  Continue Eliquis. TSH normal.  Repeat 2D echo ordered by ID on 09/27/2020: LVEF 50-55% cardiologist commented that this is an otherwise normal echocardiogram.   Hypotension: Likely multifactorial, dehydration, sepsis, A. fib with RVR.  Resolved after IV fluids.   Lactic acidosis: Secondary to sepsis and dehydration.  Resolved.  Hyperlipidemia-continue home medications   Essential hypertension-now controlled on Toprol-XL alone.  Has mostly SBP's in the 100s.  Given recent AKI, risk for hypotension, holding Exforge at discharge and can be reassessed during close outpatient follow-up with  BPH-continue home medications.  Hematuria could be due to obstructing UPJ stone with emphysematous pyelonephritis and subsequent procedure for same.  Improving.  Outpatient follow-up with Dr. [name].   Type 2 diabetes mellitus-hold metformin, placed on sliding scale.  Labile  and mildly uncontrolled.  A1c 6.2 on 09/24/2020 suggests good outpatient control. Almost 48 hours since he received IV contrast in may resume tomorrow at breakfast.   Acute anemia: Hemoglobin dropped from his baseline of 12-13 g to 10.4.  Do not think that this is due to the hematuria.  Suspect dilutional and acute illness.  Stable in the 10 g range.  Thrombocytopenia: Secondary to sepsis.  Resolved  CT chest abnormality: Reported as infectious/inflammatory abnormality (see detailed report below).  However no clinical pneumonia.  Per radiology, follow-up imaging recommended to confirm complete resolution.  This can be done in about 4 weeks   Body mass index is 33.55 kg/m.      Consultants:   Cardiology Urology Infectious disease   Procedures:   Cystoscopy, right retrograde pyelogram with intraoperative interpretation, insertion right JJ stent by urology on 09/27/2020.     Discharge Instructions  Discharge Instructions     Call MD for:  difficulty breathing, headache or visual disturbances   Complete by: As directed    Call MD for:  extreme fatigue   Complete by: As directed    Call MD for:  persistant dizziness or light-headedness   Complete by: As directed    Call MD for:  persistant nausea and vomiting   Complete by: As directed    Call MD for:  severe uncontrolled pain   Complete by: As directed    Call MD for:  temperature >100.4   Complete by: As directed    Diet - low sodium heart healthy   Complete by: As directed    Diet Carb Modified   Complete by: As directed    Increase activity slowly   Complete by: As directed    No wound care   Complete by: As directed         Medication List     STOP taking these medications    amLODipine-valsartan 5-160 MG tablet Commonly known as: EXFORGE       TAKE these medications    albuterol 108 (90 Base) MCG/ACT inhaler Commonly known as: VENTOLIN HFA USE 2 INHALATIONS BY MOUTH  EVERY 6 HOURS AS NEEDED FOR  WHEEZING OR SHORTNESS OF  BREATH What changed: See the new instructions.   apixaban 5 MG Tabs tablet Commonly known as: ELIQUIS Take 1 tablet (5 mg total) by mouth 2 (two) times daily.   aspirin 81 MG chewable tablet Chew 81 mg by mouth daily.   cefadroxil 1 g tablet Commonly known as: DURICEF Take 1 tablet (1 g total) by mouth 2 (two) times daily for 9 days.   colchicine 0.6 MG tablet TAKE 1 TABLET BY MOUTH DAILY AS NEEDED (GOUT FLARE) What changed: See the new instructions.   ezetimibe 10 MG tablet Commonly known as: ZETIA Take 1 tablet (10 mg total) by mouth daily.   Fish Oil 1200 MG Caps Take 2,400 mg by mouth in the morning and at bedtime.   metFORMIN 500 MG tablet Commonly known as: GLUCOPHAGE TAKE 1 TABLET BY MOUTH EVERY DAY WITH BREAKFAST What changed: See the new instructions.   metoprolol succinate 50 MG 24 hr tablet Commonly known as: TOPROL-XL Take 1.5 tablets (75 mg total) by mouth 2 (two) times daily. Take with or immediately following a meal. What changed:  how much to take when to take this   Potassium 99 MG Tabs Take 1-2 tablets (99-198 mg total) by mouth daily. What changed: how much to take   rosuvastatin 5 MG tablet Commonly known as: CRESTOR Take 1 tablet (5 mg total) by mouth daily.   silodosin 8 MG Caps capsule Commonly known as: RAPAFLO Take 1 capsule (8 mg total) by mouth daily with breakfast. What changed: when to take this   VISINE-A OP Place 1 drop into both eyes daily as needed (dry eyes).       Allergies  Allergen Reactions   Atorvastatin Rash    Rash on his face, scalp, and chest with Lipitor 107m daily    Pravastatin Rash    Rash on his face, scalp, and chest with pravastatin 453mdaily       Procedures/Studies: DG Chest 2 View  Result Date: 09/23/2020 CLINICAL DATA:  hypotension, fever, EXAM: CHEST - 2 VIEW COMPARISON:  None. FINDINGS: Elevation of the right hemidiaphragm. The cardiomediastinal silhouette is  within normal limits. No pleural effusion. No pneumothorax. No mass or consolidation. Bilateral shoulder arthroplasty. IMPRESSION: Elevation of the right hemidiaphragm without other acute focal process identified in the chest. Electronically Signed   By: YaAlbin Felling.D.   On: 09/23/2020 15:07   CT CHEST W CONTRAST  Result Date: 09/26/2020 CLINICAL DATA:  Fever of unknown origin. EXAM: CT CHEST, ABDOMEN, AND PELVIS WITH CONTRAST TECHNIQUE: Multidetector CT imaging of  the chest, abdomen and pelvis was performed following the standard protocol during bolus administration of intravenous contrast. CONTRAST:  19m OMNIPAQUE IOHEXOL 350 MG/ML SOLN COMPARISON:  Two views chest 09/26/2015. FINDINGS: CT CHEST FINDINGS Cardiovascular: Aorta is normal in size. There are atherosclerotic calcifications of the aorta and coronary arteries. Normal heart size. No pericardial effusion. Mediastinum/Nodes: There are scattered nonenlarged mediastinal and hilar lymph nodes. The esophagus is visualized thyroid gland are within normal limits. Lungs/Pleura: There is no pleural effusion or pneumothorax. There is linear atelectasis in the lung bases. There are multifocal tree-in-bud, ground-glass and ill-defined nodular densities in the bilateral upper lobes and left lower lobe compatible with infectious/inflammatory process. There also some minimal peripheral reticular opacities in both lung bases, nonspecific. 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 No acute fractures are seen. There is a prominent Schmorl's node along the inferior endplate of TF57 right-sided diaphragmatic hernia is noted. This is unchanged from prior. CT ABDOMEN PELVIS FINDINGS Hepatobiliary: There is diffuse fatty infiltration of the liver. Gallbladder and bile ducts appear within normal limits. Pancreas: There is a punctate calculus in the pancreatic head which can be seen with chronic pancreatitis. No pancreatic ductal dilatation or  surrounding inflammatory changes. Spleen: Normal in size without focal abnormality. Adrenals/Urinary Tract: The bilateral adrenal glands, left kidney, and bladder are within normal limits. There is a 9 x 6 x 10 mm calculus at the ureterovesicular junction. There is moderate right-sided hydronephrosis. Air is seen within the right renal pelvis and collecting system worrisome for infection. There is some patchy areas of cortical hypodensity in the right kidney suspicious for pyelonephritis. There is right perinephric and Peri ureteral fat stranding. There is also enhancement and stranding surrounding the mid/distal ureter. No focal fluid collection to suggest abscess. Stomach/Bowel: Stomach is within normal limits. Appendix appears normal. No evidence of bowel wall thickening, distention, or inflammatory changes. There is sigmoid colon diverticulosis without evidence for acute diverticulitis. Vascular/Lymphatic: Aortic atherosclerosis. No enlarged abdominal or pelvic lymph nodes. Reproductive: There are dense calcifications in the prostate gland. The prostate is nonenlarged. Other: No abdominal wall hernia or abnormality. No abdominopelvic ascites. 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 There is no listhesis. There are degenerative changes at L5-S1. IMPRESSION: 1. Right-sided emphysematous pyelonephritis. There is air in the renal collecting system. Infection also involves the right ureter. 2. 10 mm calculus at the right ureteropelvic junction with moderate obstructive uropathy. 3. Scattered tree-in-bud, ground-glass and ill-defined nodular densities in the left lower lobe and bilateral upper lobes compatible with infectious/inflammatory process. Follow-up imaging recommended to confirm complete resolution. 4. Sigmoid colon diverticulosis without evidence for diverticulitis. 5. Aortic Atherosclerosis (ICD10-I70.0). These results were called by telephone at the time of interpretation  on 09/26/2020 at 5:15 pm to provider Dr. AVernell Leep who verbally acknowledged these results. Electronically Signed   By: ARonney AstersM.D.   On: 09/26/2020 17:15   CT ABDOMEN PELVIS W CONTRAST  Result Date: 09/26/2020 CLINICAL DATA:  Fever of unknown origin. EXAM: CT CHEST, ABDOMEN, AND PELVIS WITH CONTRAST TECHNIQUE: Multidetector CT imaging of the chest, abdomen and pelvis was performed following the standard protocol during bolus administration of intravenous contrast. CONTRAST:  871mOMNIPAQUE IOHEXOL 350 MG/ML SOLN COMPARISON:  Two views chest 09/26/2015. FINDINGS: CT CHEST FINDINGS Cardiovascular: Aorta is normal in size. There are atherosclerotic calcifications of the aorta and coronary arteries. Normal heart size. No pericardial effusion. Mediastinum/Nodes: There are scattered nonenlarged mediastinal and hilar lymph nodes. The esophagus is visualized thyroid gland  are within normal limits. Lungs/Pleura: There is no pleural effusion or pneumothorax. There is linear atelectasis in the lung bases. There are multifocal tree-in-bud, ground-glass and ill-defined nodular densities in the bilateral upper lobes and left lower lobe compatible with infectious/inflammatory process. There also some minimal peripheral reticular opacities in both lung bases, nonspecific. 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 No acute fractures are seen. There is a prominent Schmorl's node along the inferior endplate of Z00. right-sided diaphragmatic hernia is noted. This is unchanged from prior. CT ABDOMEN PELVIS FINDINGS Hepatobiliary: There is diffuse fatty infiltration of the liver. Gallbladder and bile ducts appear within normal limits. Pancreas: There is a punctate calculus in the pancreatic head which can be seen with chronic pancreatitis. No pancreatic ductal dilatation or surrounding inflammatory changes. Spleen: Normal in size without focal abnormality. Adrenals/Urinary Tract: The bilateral  adrenal glands, left kidney, and bladder are within normal limits. There is a 9 x 6 x 10 mm calculus at the ureterovesicular junction. There is moderate right-sided hydronephrosis. Air is seen within the right renal pelvis and collecting system worrisome for infection. There is some patchy areas of cortical hypodensity in the right kidney suspicious for pyelonephritis. There is right perinephric and Peri ureteral fat stranding. There is also enhancement and stranding surrounding the mid/distal ureter. No focal fluid collection to suggest abscess. Stomach/Bowel: Stomach is within normal limits. Appendix appears normal. No evidence of bowel wall thickening, distention, or inflammatory changes. There is sigmoid colon diverticulosis without evidence for acute diverticulitis. Vascular/Lymphatic: Aortic atherosclerosis. No enlarged abdominal or pelvic lymph nodes. Reproductive: There are dense calcifications in the prostate gland. The prostate is nonenlarged. Other: No abdominal wall hernia or abnormality. No abdominopelvic ascites. 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 There is no listhesis. There are degenerative changes at L5-S1. IMPRESSION: 1. Right-sided emphysematous pyelonephritis. There is air in the renal collecting system. Infection also involves the right ureter. 2. 10 mm calculus at the right ureteropelvic junction with moderate obstructive uropathy. 3. Scattered tree-in-bud, ground-glass and ill-defined nodular densities in the left lower lobe and bilateral upper lobes compatible with infectious/inflammatory process. Follow-up imaging recommended to confirm complete resolution. 4. Sigmoid colon diverticulosis without evidence for diverticulitis. 5. Aortic Atherosclerosis (ICD10-I70.0). These results were called by telephone at the time of interpretation on 09/26/2020 at 5:15 pm to provider Dr. Vernell Leep, who verbally acknowledged these results. Electronically Signed    By: Ronney Asters M.D.   On: 09/26/2020 17:15   DG C-Arm 1-60 Min-No Report  Result Date: 09/27/2020 Fluoroscopy was utilized by the requesting physician.  No radiographic interpretation.   ECHOCARDIOGRAM COMPLETE  Result Date: 09/27/2020    ECHOCARDIOGRAM REPORT   Patient Name:   QUANG THORPE [facility] Date of Exam: 09/27/2020 Medical Rec #:  [id number]        Height:       68.0 in Accession #:    [id number]       Weight:       220.7 lb Date of Birth:  Aug 25, 1950        BSA:          2.131 m Patient Age:    70 years         BP:           133/87 mmHg Patient Gender: M                HR:           108 bpm. Exam Location:  Inpatient  Procedure: 2D Echo, Cardiac Doppler and Color Doppler Indications:    Fever R50.9  History:        Patient has prior history of Echocardiogram examinations, most                 recent 08/07/2020. CAD and Previous Myocardial Infarction,                 Signs/Symptoms:Shortness of Breath; Risk Factors:Diabetes,                 Hypertension and Dyslipidemia.  Sonographer:    Bernadene Person RDCS Referring Phys: [place]  1. Left ventricular ejection fraction, by estimation, is 50 to 55%. The left ventricle has low normal function. The left ventricle has no regional wall motion abnormalities. Left ventricular diastolic function could not be evaluated.  2. Right ventricular systolic function is normal. The right ventricular size is normal. There is mildly elevated pulmonary artery systolic pressure.  3. Left atrial size was moderately dilated.  4. Right atrial size was moderately dilated.  5. The mitral valve is normal in structure. Trivial mitral valve regurgitation. No evidence of mitral stenosis.  6. The aortic valve is grossly normal. Aortic valve regurgitation is not visualized. No aortic stenosis is present.  7. The inferior vena cava is dilated in size with <50% respiratory variability, suggesting right atrial pressure of 15 mmHg. Comparison(s): Prior images  reviewed side by side. Conclusion(s)/Recommendation(s): Otherwise normal echocardiogram, with minor abnormalities described in the report. FINDINGS  Left Ventricle: Left ventricular ejection fraction, by estimation, is 50 to 55%. The left ventricle has low normal function. The left ventricle has no regional wall motion abnormalities. The left ventricular internal cavity size was normal in size. There is no left ventricular hypertrophy. Left ventricular diastolic function could not be evaluated due to atrial fibrillation. Left ventricular diastolic function could not be evaluated. Right Ventricle: The right ventricular size is normal. Right vetricular wall thickness was not well visualized. Right ventricular systolic function is normal. There is mildly elevated pulmonary artery systolic pressure. The tricuspid regurgitant velocity  is 2.52 m/s, and with an assumed right atrial pressure of 15 mmHg, the estimated right ventricular systolic pressure is 48.0 mmHg. Left Atrium: Left atrial size was moderately dilated. Right Atrium: Right atrial size was moderately dilated. Pericardium: There is no evidence of pericardial effusion. Presence of pericardial fat pad. Mitral Valve: The mitral valve is normal in structure. Trivial mitral valve regurgitation. No evidence of mitral valve stenosis. Tricuspid Valve: The tricuspid valve is normal in structure. Tricuspid valve regurgitation is trivial. No evidence of tricuspid stenosis. Aortic Valve: The aortic valve is grossly normal. Aortic valve regurgitation is not visualized. No aortic stenosis is present. Pulmonic Valve: The pulmonic valve was not well visualized. Pulmonic valve regurgitation is trivial. No evidence of pulmonic stenosis. Aorta: The aortic root, ascending aorta, aortic arch and descending aorta are all structurally normal, with no evidence of dilitation or obstruction. Venous: The inferior vena cava is dilated in size with less than 50% respiratory variability,  suggesting right atrial pressure of 15 mmHg. IAS/Shunts: The atrial septum is grossly normal.  LEFT VENTRICLE PLAX 2D LVIDd:         4.20 cm LVIDs:         3.00 cm LV PW:         1.00 cm LV IVS:        1.10 cm LVOT diam:     2.00 cm LV  SV:         48 LV SV Index:   23 LVOT Area:     3.14 cm  LV Volumes (MOD) LV vol d, MOD A2C: 87.7 ml LV vol d, MOD A4C: 99.4 ml LV vol s, MOD A2C: 44.3 ml LV vol s, MOD A4C: 51.4 ml LV SV MOD A2C:     43.4 ml LV SV MOD A4C:     99.4 ml LV SV MOD BP:      47.1 ml RIGHT VENTRICLE RV S prime:     12.10 cm/s TAPSE (M-mode): 1.7 cm LEFT ATRIUM             Index       RIGHT ATRIUM           Index LA diam:        4.90 cm 2.30 cm/m  RA Area:     20.00 cm LA Vol (A2C):   58.8 ml 27.59 ml/m RA Volume:   63.10 ml  29.61 ml/m LA Vol (A4C):   67.6 ml 31.72 ml/m LA Biplane Vol: 64.9 ml 30.45 ml/m  AORTIC VALVE LVOT Vmax:   87.40 cm/s LVOT Vmean:  60.700 cm/s LVOT VTI:    0.153 m  AORTA Ao Root diam: 3.30 cm Ao Asc diam:  3.00 cm MITRAL VALVE               TRICUSPID VALVE MV Area (PHT): 2.41 cm    TR Peak grad:   25.4 mmHg MV Decel Time: 315 msec    TR Vmax:        252.00 cm/s MV E velocity: 69.40 cm/s MV A velocity: 71.60 cm/s  SHUNTS MV E/A ratio:  0.97        Systemic VTI:  0.15 m                            Systemic Diam: 2.00 cm [name] MD Electronically signed by [name] MD Signature Date/Time: 09/27/2020/4:49:48 PM    Final       Subjective: Denies complaints.  No fever, chills, pain, dyspnea.  Has been ambulating comfortably in the room and to the bathroom.  Tolerating diet.  Discharge Exam:  Vitals:   09/27/20 1420 09/27/20 2111 09/28/20 0450 09/28/20 0836  BP: 108/78 114/85 (!) 142/103   Pulse: 88 (!) 104 (!) 57 (!) 103  Resp: _0 Temp: 98.2 F (36.8 C) 97.6 F (36.4 C) (!) 97.5 F (36.4 C)   TempSrc: Oral Oral Oral   SpO2: 96% 93% 98%   Weight:      Height:       General exam: Middle-age male, lying comfortably propped up in  bed without distress.  Does not appear septic or toxic. Respiratory system: Clear to auscultation.  No increased work of breathing. Cardiovascular system: S1 and S2 heard, RRR.  No JVD or murmurs.  Telemetry personally reviewed: A. fib with ventricular rate mostly in the 80s-90s.  Occasional RVR 110-120s. Gastrointestinal system: Abdomen is nondistended, soft and nontender. No organomegaly or masses felt. Normal bowel sounds heard. GU: Slightly blood-tinged urine in urinal at bedside. Central nervous system: Alert and oriented. No focal neurological deficits. Extremities: Symmetric 5 x 5 power. Skin: No rashes, lesions or ulcers Psychiatry: Judgement and insight appear normal. Mood & affect appropriate.     The results of significant diagnostics from this hospitalization (including imaging, microbiology, ancillary and laboratory) are  listed below for reference.     Microbiology: Recent Results (from the past 240 hour(s))  Resp Panel by RT-PCR (Flu A&B, Covid) Nasopharyngeal Swab     Status: None   Collection Time: 09/23/20  3:41 PM   Specimen: Nasopharyngeal Swab; Nasopharyngeal(NP) swabs in vial transport medium  Result Value Ref Range Status   SARS Coronavirus 2 by RT PCR NEGATIVE NEGATIVE Final    Comment: (NOTE) SARS-CoV-2 target nucleic acids are NOT DETECTED.  The SARS-CoV-2 RNA is generally detectable in upper respiratory specimens during the acute phase of infection. The lowest concentration of SARS-CoV-2 viral copies this assay can detect is 138 copies/mL. A negative result does not preclude SARS-Cov-2 infection and should not be used as the sole basis for treatment or other patient management decisions. A negative result may occur with  improper specimen collection/handling, submission of specimen other than nasopharyngeal swab, presence of viral mutation(s) within the areas targeted by this assay, and inadequate number of viral copies(<138 copies/mL). A negative result  must be combined with clinical observations, patient history, and epidemiological information. The expected result is Negative.  Fact Sheet for Patients:  EntrepreneurPulse.com.au  Fact Sheet for Healthcare Providers:  IncredibleEmployment.be  This test is no t yet approved or cleared by the [place] FDA and  has been authorized for detection and/or diagnosis of SARS-CoV-2 by FDA under an Emergency Use Authorization (EUA). This EUA will remain  in effect (meaning this test can be used) for the duration of the COVID-19 declaration under Section 564(b)(1) of the Act, 21 U.S.C.section 360bbb-3(b)(1), unless the authorization is terminated  or revoked sooner.       Influenza A by PCR NEGATIVE NEGATIVE Final   Influenza B by PCR NEGATIVE NEGATIVE Final    Comment: (NOTE) The Xpert Xpress SARS-CoV-2/FLU/RSV plus assay is intended as an aid in the diagnosis of influenza from Nasopharyngeal swab specimens and should not be used as a sole basis for treatment. Nasal washings and aspirates are unacceptable for Xpert Xpress SARS-CoV-2/FLU/RSV testing.  Fact Sheet for Patients: EntrepreneurPulse.com.au  Fact Sheet for Healthcare Providers: IncredibleEmployment.be  This test is not yet approved or cleared by the [place] FDA and has been authorized for detection and/or diagnosis of SARS-CoV-2 by FDA under an Emergency Use Authorization (EUA). This EUA will remain in effect (meaning this test can be used) for the duration of the COVID-19 declaration under Section 564(b)(1) of the Act, 21 U.S.C. section 360bbb-3(b)(1), unless the authorization is terminated or revoked.  Performed at [facility], [place] [address] [place], [place] [address]   Blood Culture (routine x 2)     Status: None   Collection Time: 09/23/20  3:41 PM   Specimen: BLOOD  Result Value Ref Range Status   Specimen  Description   Final    BLOOD LEFT ANTECUBITAL Performed at [facility] [address], [place], [place] [address]    Special Requests   Final    BOTTLES DRAWN AEROBIC AND ANAEROBIC Blood Culture results may not be optimal due to an excessive volume of blood received in culture bottles Performed at [facility] [address], [place], [place] [address]    Culture   Final    NO GROWTH 5 DAYS Performed at [facility], [place] [address] [place], [place] [address]    Report Status 09/28/2020 FINAL  Final  Urine Culture     Status: Abnormal   Collection Time: 09/23/20  3:59 PM  Specimen: Urine, Clean Catch  Result Value Ref Range Status   Specimen Description   Final    URINE, CLEAN CATCH Performed at [facility], [place] [address] [place], [place] [address]    Special Requests   Final    NONE Performed at [facility], [place] [address], [place], [place] [address]    Culture >=100,000 COLONIES/mL ESCHERICHIA COLI (A)  Final   Report Status 09/25/2020 FINAL  Final   Organism ID, Bacteria ESCHERICHIA COLI (A)  Final      Susceptibility   Escherichia coli - MIC*    AMPICILLIN 8 SENSITIVE Sensitive     CEFAZOLIN <=4 SENSITIVE Sensitive     CEFEPIME <=0.12 SENSITIVE Sensitive     CEFTRIAXONE <=0.25 SENSITIVE Sensitive     CIPROFLOXACIN 0.5 SENSITIVE Sensitive     GENTAMICIN <=1 SENSITIVE Sensitive     IMIPENEM <=0.25 SENSITIVE Sensitive     NITROFURANTOIN <=16 SENSITIVE Sensitive     TRIMETH/SULFA <=20 SENSITIVE Sensitive     AMPICILLIN/SULBACTAM 4 SENSITIVE Sensitive     PIP/TAZO <=4 SENSITIVE Sensitive     * >=100,000 COLONIES/mL ESCHERICHIA COLI  Blood Culture (routine x 2)     Status: None   Collection Time: 09/23/20  5:41 PM   Specimen: BLOOD  Result Value Ref Range Status   Specimen Description   Final    BLOOD RIGHT ANTECUBITAL Performed at [facility] [address], [place], [place] [address]    Special Requests   Final    BOTTLES DRAWN AEROBIC AND ANAEROBIC Blood Culture results may not be optimal due to an excessive volume of blood received in culture bottles Performed at [facility] [address], [place], [place] [address]    Culture   Final    NO GROWTH 5 DAYS Performed at [facility], [place] [address], [place], [place] [address]    Report Status 09/28/2020 FINAL  Final  Surgical pcr screen     Status: None   Collection Time: 09/27/20  5:35 AM   Specimen: Nasal Mucosa; Nasal Swab  Result Value Ref Range Status   MRSA, PCR NEGATIVE NEGATIVE Final   Staphylococcus aureus NEGATIVE NEGATIVE Final    Comment: (NOTE) The Xpert SA Assay (FDA approved for NASAL specimens in patients 91 years of age and older), is one component of a comprehensive surveillance program. It is not intended to diagnose infection nor to guide or monitor treatment. Performed at [facility], [place] [address], [place], [place] [address]   Aerobic/Anaerobic Culture w Gram Stain (surgical/deep wound)     Status: None (Preliminary result)   Collection Time: 09/27/20  7:51 AM   Specimen: PATH Other; Tissue  Result Value Ref Range Status   Specimen Description   Final    URINE, RANDOM RENAL PELVIS Performed at [facility] [address], [place], [place] [address]    Special Requests   Final    RIGHT URETERAL STONE Performed at [facility] [address], [place], [place] [address]    Gram Stain NO WBC SEEN NO ORGANISMS SEEN   Final   Culture   Final    NO GROWTH < 24 HOURS Performed at [facility], [place] [address], [place], [place] [address]    Report Status PENDING  Incomplete     Labs: CBC: Recent Labs  Lab 09/23/20 1416 09/24/20 0432 09/25/20 0422 09/26/20 0445 09/27/20 0434 09/28/20  0442  WBC 9.3 9.7 7.1 8.6 6.2 8.3  NEUTROABS 8.1*  --   --   --   --   --    HGB 13.3 10.4* 10.5* 11.0* 10.6* 10.9*  HCT 38.8* 30.9* 29.9* 31.4* 29.9* 31.4*  MCV 99.0 100.0 97.4 96.9 94.6 97.2  PLT 165 126* 134* 143* 181 272    Basic Metabolic Panel: Recent Labs  Lab 09/23/20 1416 09/24/20 0432 09/25/20 0422 09/26/20 0445 09/27/20 0434 09/28/20 0442  NA 138 137 136  --  140 137  K 4.4 4.2 4.1  --  3.7 4.3  CL 98 102 103  --  102 102  CO2 _0 --  26 26  GLUCOSE 229* 160* 169*  --  139* 273*  BUN 27* 24* 23  --  14 18  CREATININE 1.44* 1.19 0.89  --  0.87 0.77  CALCIUM 9.3 8.3* 8.4*  --  8.6* 8.9  MG  --   --   --  1.5* 1.7  --     Liver Function Tests: Recent Labs  Lab 09/23/20 1416 09/24/20 0432  AST 21 21  ALT 18 17  ALKPHOS 62 54  BILITOT 1.4* 1.0  PROT 7.8 6.4*  ALBUMIN 4.1 3.3*    CBG: Recent Labs  Lab 09/26/20 2205 09/27/20 0816 09/27/20 1116 09/27/20 1611 09/28/20 0725  GLUCAP 150* 143* 158* 301* 263*    Thyroid function studies Recent Labs    09/27/20 0434  TSH 1.782     Urinalysis    Component Value Date/Time   COLORURINE AMBER (A) 09/23/2020 1559   APPEARANCEUR TURBID (A) 09/23/2020 1559   LABSPEC 1.025 09/23/2020 1559   PHURINE 6.0 09/23/2020 1559   GLUCOSEU NEGATIVE 09/23/2020 1559   HGBUR LARGE (A) 09/23/2020 1559   BILIRUBINUR MODERATE (A) 09/23/2020 1559   BILIRUBINUR negative 06/06/2019 0933   KETONESUR TRACE (A) 09/23/2020 1559   PROTEINUR >=300 (A) 09/23/2020 1559   UROBILINOGEN 0.2 06/06/2019 0933   NITRITE POSITIVE (A) 09/23/2020 1559   LEUKOCYTESUR MODERATE (A) 09/23/2020 1559      Time coordinating discharge: 35 minutes  SIGNED:  Vernell Leep, MD, FACP, [facility]. [facility]  To contact the attending provider between 7A-7P or the covering provider during after hours 7P-7A, please log into the web site [email or web address] and access using universal [place] password for that web site. If you do not have the password, please call the hospital operator.

## 2020-09-30 LAB — GLUCOSE, CAPILLARY
Glucose-Capillary: 190 mg/dL — ABNORMAL HIGH (ref 70–99)
Glucose-Capillary: 293 mg/dL — ABNORMAL HIGH (ref 70–99)

## 2020-10-01 ENCOUNTER — Telehealth: Payer: Self-pay

## 2020-10-01 NOTE — Telephone Encounter (Signed)
Transition Care Management Follow-up Telephone Call Date of discharge and from where: 09/28/20 from [facility] How have you been since you were released from the hospital? "I am doing alright" Any questions or concerns? No  Items Reviewed: Did the pt receive and understand the discharge instructions provided? Yes  Medications obtained and verified? Yes  Other? No  Any new allergies since your discharge? No  Dietary orders reviewed? Yes Do you have support at home? Yes   Home Care and Equipment/Supplies: Were home health services ordered? no If so, what is the name of the agency? Not applicable  Has the agency set up a time to come to the patient's home? not applicable Were any new equipment or medical supplies ordered?  No What is the name of the medical supply agency? not Were you able to get the supplies/equipment? not applicable Do you have any questions related to the use of the equipment or supplies? No  Functional Questionnaire: (I = Independent and D = Dependent) ADLs: I  Bathing/Dressing- I  Meal Prep- I  Eating- I  Maintaining continence- I  Transferring/Ambulation- I  Managing Meds- I  Follow up appointments reviewed:  PCP Hospital f/u appt confirmed? Yes  Scheduled to see Dr. Danise Mina on 10/04/20 @ 7:30 am. [facility] f/u appt confirmed? Yes  Scheduled to see Dr. Zannie Cove on 10/09/20 @ 12:45; Dr. Baird Kay on 10/28/20 @ 10:15 am; Dr. Lionel December states will call office to set up follow up. Are transportation arrangements needed? No  If their condition worsens, is the pt aware to call PCP or go to the Emergency Dept.? Yes Was the patient provided with contact information for the PCP's office or ED? Yes Was to pt encouraged to call back with questions or concerns? Yes  Thea Silversmith, RN, MSN, BSN, [place] Care Management Coordinator [phone]

## 2020-10-02 LAB — AEROBIC/ANAEROBIC CULTURE W GRAM STAIN (SURGICAL/DEEP WOUND)
Culture: NO GROWTH
Gram Stain: NONE SEEN

## 2020-10-03 ENCOUNTER — Telehealth: Payer: Self-pay | Admitting: Interventional Cardiology

## 2020-10-03 NOTE — Telephone Encounter (Signed)
Continue current meds.  If he can check his blood pressure and heart rate at home and send [name] readings, we can further adjust his medications.

## 2020-10-03 NOTE — Telephone Encounter (Signed)
Patient states he was in the hospital recently and his medications were adjusted. He would like Dr. Irish Lack to be aware and would like a call back if there is anything to discuss.

## 2020-10-03 NOTE — Telephone Encounter (Signed)
I spoke with patient and gave him information from Dr Irish Lack.  He has been checking BP and heart rate at home and will send readings through my chart

## 2020-10-04 ENCOUNTER — Ambulatory Visit: Payer: Medicare Other | Admitting: Family Medicine

## 2020-10-04 ENCOUNTER — Other Ambulatory Visit: Payer: Self-pay

## 2020-10-04 ENCOUNTER — Encounter: Payer: Self-pay | Admitting: Family Medicine

## 2020-10-04 VITALS — BP 128/84 | HR 94 | Temp 97.5°F | Ht 68.0 in | Wt 211.5 lb

## 2020-10-04 DIAGNOSIS — N12 Tubulo-interstitial nephritis, not specified as acute or chronic: Secondary | ICD-10-CM

## 2020-10-04 DIAGNOSIS — N400 Enlarged prostate without lower urinary tract symptoms: Secondary | ICD-10-CM | POA: Diagnosis not present

## 2020-10-04 DIAGNOSIS — I10 Essential (primary) hypertension: Secondary | ICD-10-CM | POA: Diagnosis not present

## 2020-10-04 DIAGNOSIS — A4150 Gram-negative sepsis, unspecified: Secondary | ICD-10-CM

## 2020-10-04 DIAGNOSIS — N201 Calculus of ureter: Secondary | ICD-10-CM

## 2020-10-04 DIAGNOSIS — Z23 Encounter for immunization: Secondary | ICD-10-CM | POA: Diagnosis not present

## 2020-10-04 DIAGNOSIS — N390 Urinary tract infection, site not specified: Secondary | ICD-10-CM

## 2020-10-04 DIAGNOSIS — I4891 Unspecified atrial fibrillation: Secondary | ICD-10-CM

## 2020-10-04 DIAGNOSIS — E118 Type 2 diabetes mellitus with unspecified complications: Secondary | ICD-10-CM

## 2020-10-04 LAB — CBC WITH DIFFERENTIAL/PLATELET
Basophils Absolute: 0 10*3/uL (ref 0.0–0.1)
Basophils Relative: 0.6 % (ref 0.0–3.0)
Eosinophils Absolute: 0.4 10*3/uL (ref 0.0–0.7)
Eosinophils Relative: 6.5 % — ABNORMAL HIGH (ref 0.0–5.0)
HCT: 37.6 % — ABNORMAL LOW (ref 39.0–52.0)
Hemoglobin: 13 g/dL (ref 13.0–17.0)
Lymphocytes Relative: 18.5 % (ref 12.0–46.0)
Lymphs Abs: 1.2 10*3/uL (ref 0.7–4.0)
MCHC: 34.6 g/dL (ref 30.0–36.0)
MCV: 97.1 fl (ref 78.0–100.0)
Monocytes Absolute: 0.3 10*3/uL (ref 0.1–1.0)
Monocytes Relative: 5.3 % (ref 3.0–12.0)
Neutro Abs: 4.5 10*3/uL (ref 1.4–7.7)
Neutrophils Relative %: 69.1 % (ref 43.0–77.0)
Platelets: 398 10*3/uL (ref 150.0–400.0)
RBC: 3.87 Mil/uL — ABNORMAL LOW (ref 4.22–5.81)
RDW: 13.2 % (ref 11.5–15.5)
WBC: 6.5 10*3/uL (ref 4.0–10.5)

## 2020-10-04 LAB — BASIC METABOLIC PANEL
BUN: 13 mg/dL (ref 6–23)
CO2: 24 mEq/L (ref 19–32)
Calcium: 9.2 mg/dL (ref 8.4–10.5)
Chloride: 102 mEq/L (ref 96–112)
Creatinine, Ser: 0.76 mg/dL (ref 0.40–1.50)
GFR: 92.02 mL/min (ref >60.00)
Glucose, Bld: 140 mg/dL — ABNORMAL HIGH (ref 70–99)
Potassium: 4.2 mEq/L (ref 3.5–5.1)
Sodium: 137 mEq/L (ref 135–145)

## 2020-10-04 NOTE — Assessment & Plan Note (Addendum)
BP stable on low dose metformin - continue.  Good fasting cbg's by log he brings.

## 2020-10-04 NOTE — Progress Notes (Signed)
Patient ID: Travis Howell, male    DOB: 07/19/1950, 70 y.o.   MRN: [id number]  This visit was conducted in person.  BP 128/84   Pulse 94   Temp (!) 97.5 F (36.4 C) (Temporal)   Ht 5\' 8"  (1.727 m)   Wt 211 lb 8 oz (95.9 kg)   SpO2 97%   BMI 32.16 kg/m    CC: hosp f/u visit  Subjective:   HPI: Travis Howell is a 70 y.o. male presenting on 10/04/2020 for Hospitalization Follow-up (Admitted on 09/23/20 at [facility], dx controlled DM with complications.  States he supposed to have lab work done. )   Recent hospitalization for nausea/vomiting, weakness associated with hematuria, difficulty urinating, dysuria - was sent to ER from urology office. Found to have E coli acute R emphysematous pyelonephritis with sepsis due to ureteral obstructing ureteral calculus, also found to have Afib with RVR and acute kidney insufficiency. He also had persistent fevers despite appropriate antibiotics so ID was consulted - this was when obstructing ureteral stone was found. UCx grew pansensitive E coli, blood cultures negative x2.   Cardiology, urology consulted as well.  S/p R cystoscopy with R JJ ureteral stent placement 9/23.  Planned 14 days of antibiotics (with cefadroxil).   Chronic afib with new RVR driven by sepsis - previously failed cardioversion, not a candidate for flecainide due to CAD, avoiding amoidarone due to long term side effects. Planning to see EP in outpatient setting. Current regimen includes Toprol XL 75mg  BID, eliquis. He had reassuring echocardiogram while hospitalized. Mild anemia thought dilutional and due to acute illness.   Exforge 5/160mg  was held upon discharge.   Upcoming appt with uro ([place]) 10/5 Upcoming appt with EP ([place]) 10/24. No cardiology appt yet Irish Lack).   Since home, feeling well. Good appetite. Weight loss while hospitalized. No further fevers/chills, abd pain, chest pain, flank pain, hematuria, dysuria, dyspnea or chest pain. Continues antibiotic.    Recent home vital log: Glucose                          Blood Pressure         Pulse 9/25   116                                   119/84                  82                                 9/26   115                                    137/90                 97 9/27   116                                   126/90                  98 9/28   110  142/93                  89 9/29   105                                    122/94                 80   Admit date: 09/23/2020 Discharge date: 09/28/2020 TCM hosp f/u phone call completed on 10/01/2020  Recommendations for Outpatient Follow-up:  Repeat labs CBC/BMP in 1 wk.   Discharge Diagnoses:  Principal Problem:   Emphysematous pyelonephritis Active Problems:   Atrial fibrillation with RVR (HCC)   Sepsis ([place])   Sepsis due to gram-negative UTI ([place])   AKI (acute kidney injury) [facility])  Consultants:   Cardiology Urology Infectious disease   Procedures:   Cystoscopy, right retrograde pyelogram with intraoperative interpretation, insertion right JJ stent by urology on 09/27/2020.  Home Health: None Equipment/Devices: None  Discharge Condition: Improved and stable Code Status: Full Code     Relevant past medical, surgical, family and social history reviewed and updated as indicated. Interim medical history since our last visit reviewed. Allergies and medications reviewed and updated. Outpatient Medications Prior to Visit  Medication Sig Dispense Refill   albuterol (VENTOLIN HFA) 108 (90 Base) MCG/ACT inhaler USE 2 INHALATIONS BY MOUTH  EVERY 6 HOURS AS NEEDED FOR WHEEZING OR SHORTNESS OF  BREATH (Patient taking differently: Inhale 2 puffs into the lungs every 6 (six) hours as needed for wheezing or shortness of breath.) 72 g 0   apixaban (ELIQUIS) 5 MG TABS tablet Take 1 tablet (5 mg total) by mouth 2 (two) times daily. 60 tablet 6   aspirin 81 MG chewable tablet Chew 81 mg by mouth daily.      cefadroxil  (DURICEF) 1 g tablet Take 1 tablet (1 g total) by mouth 2 (two) times daily for 9 days. 18 tablet 0   colchicine 0.6 MG tablet TAKE 1 TABLET BY MOUTH DAILY AS NEEDED (GOUT FLARE) 30 tablet 1   ezetimibe (ZETIA) 10 MG tablet Take 1 tablet (10 mg total) by mouth daily. 90 tablet 3   metFORMIN (GLUCOPHAGE) 500 MG tablet TAKE 1 TABLET BY MOUTH EVERY DAY WITH BREAKFAST (Patient taking differently: Take 500 mg by mouth daily with breakfast.) 90 tablet 3   metoprolol succinate (TOPROL-XL) 50 MG 24 hr tablet Take 1.5 tablets (75 mg total) by mouth 2 (two) times daily. Take with or immediately following a meal. 90 tablet 1   Naphazoline-Pheniramine (VISINE-A OP) Place 1 drop into both eyes daily as needed (dry eyes).     Omega-3 Fatty Acids (FISH OIL) 1200 MG CAPS Take 2,400 mg by mouth in the morning and at bedtime.     Potassium 99 MG TABS Take 1-2 tablets (99-198 mg total) by mouth daily. (Patient taking differently: Take 99 mg by mouth daily.)     rosuvastatin (CRESTOR) 5 MG tablet Take 1 tablet (5 mg total) by mouth daily. 90 tablet 3   silodosin (RAPAFLO) 8 MG CAPS capsule Take 1 capsule (8 mg total) by mouth daily with breakfast. (Patient taking differently: Take 8 mg by mouth daily.) 90 capsule 3   No facility-administered medications prior to visit.     Per HPI unless specifically indicated in ROS section below Review of Systems  Objective:  BP 128/84   Pulse 94   Temp (!) 97.5  F (36.4 C) (Temporal)   Ht 5\' 8"  (1.727 m)   Wt 211 lb 8 oz (95.9 kg)   SpO2 97%   BMI 32.16 kg/m   Wt Readings from Last 3 Encounters:  10/04/20 211 lb 8 oz (95.9 kg)  09/24/20 220 lb 10.9 oz (100.1 kg)  09/16/20 219 lb 3.2 oz (99.4 kg)      Physical Exam Vitals and nursing note reviewed.  Constitutional:      Appearance: Normal appearance. He is not ill-appearing.  Eyes:     Extraocular Movements: Extraocular movements intact.     Conjunctiva/sclera: Conjunctivae normal.     Pupils: Pupils are equal,  round, and reactive to light.  Cardiovascular:     Rate and Rhythm: Normal rate. Rhythm irregularly irregular.     Pulses: Normal pulses.     Heart sounds: Normal heart sounds. No murmur heard. Pulmonary:     Effort: Pulmonary effort is normal. No respiratory distress.     Breath sounds: Normal breath sounds. No wheezing, rhonchi or rales.  Abdominal:     General: Bowel sounds are normal. There is no distension.     Palpations: Abdomen is soft. There is no mass.     Tenderness: There is no abdominal tenderness. There is no right CVA tenderness, left CVA tenderness, guarding or rebound.     Hernia: No hernia is present.  Musculoskeletal:     Right lower leg: No edema.     Left lower leg: No edema.  Skin:    General: Skin is warm and dry.     Findings: No rash.  Neurological:     Mental Status: He is alert.  Psychiatric:        Mood and Affect: Mood normal.        Behavior: Behavior normal.       Assessment & Plan:  This visit occurred during the SARS-CoV-2 public health emergency.  Safety protocols were in place, including screening questions prior to the visit, additional usage of staff PPE, and extensive cleaning of exam room while observing appropriate contact time as indicated for disinfecting solutions.   Problem List Items Addressed This Visit     HTN (hypertension)    Maintaining BP off exforge, only on toprol XL 75mg  BID - continue this.  He does have EP f/u already planned      BPH (benign prostatic hyperplasia)   Controlled diabetes mellitus type 2 with complications (HCC)    BP stable on low dose metformin - continue.  Good fasting cbg's by log he brings.       Atrial fibrillation with RVR ([place])    Appreciate cards/EP care. On eliquis, aspirin, toprol XL.       Sepsis due to gram-negative UTI (HCC)    Continues Cefadroxil.       Emphysematous pyelonephritis    Continue Cefadroxil to complete 14 d course.       Relevant Orders   Basic metabolic panel    CBC with Differential/Platelet   Right ureteral stone - Primary    Ureteral stone caused obstruction and pyelonephritis. Currently has JJ stent in place - has f/u planned with urology for next week.  Update labwork today (CBC, BMP).  Update if worsening symptoms.  Pt agrees with plan.       Relevant Orders   Basic metabolic panel   CBC with Differential/Platelet   Other Visit Diagnoses     Need for influenza vaccination       Relevant  Orders   Flu Vaccine QUAD High Dose(Fluad) (Completed)        No orders of the defined types were placed in this encounter.  Orders Placed This Encounter  Procedures   Flu Vaccine QUAD High Dose(Fluad)   Basic metabolic panel   CBC with Differential/Platelet     Patient Instructions  Flu shot today  Labs today.  Stay off exforge for now.  Continue metoprolol XL 75mg  twice daily.  Good to see you today.   Follow up plan: Return if symptoms worsen or fail to improve.  [name], MD

## 2020-10-04 NOTE — Assessment & Plan Note (Signed)
Continue Cefadroxil to complete 14 d course.

## 2020-10-04 NOTE — Assessment & Plan Note (Signed)
Appreciate cards/EP care. On eliquis, aspirin, toprol XL.

## 2020-10-04 NOTE — Patient Instructions (Addendum)
Flu shot today  Labs today.  Stay off exforge for now.  Continue metoprolol XL 75mg  twice daily.  Good to see you today.

## 2020-10-04 NOTE — Assessment & Plan Note (Signed)
Continues Cefadroxil.

## 2020-10-04 NOTE — Assessment & Plan Note (Addendum)
Ureteral stone caused obstruction and pyelonephritis. Currently has JJ stent in place - has f/u planned with urology for next week.  Update labwork today (CBC, BMP).  Update if worsening symptoms.  Pt agrees with plan.

## 2020-10-04 NOTE — Assessment & Plan Note (Addendum)
Maintaining BP off exforge, only on toprol XL 75mg  BID - continue this.  He does have EP f/u already planned

## 2020-10-10 ENCOUNTER — Other Ambulatory Visit: Payer: Self-pay | Admitting: Urology

## 2020-10-10 DIAGNOSIS — N200 Calculus of kidney: Secondary | ICD-10-CM

## 2020-10-11 ENCOUNTER — Telehealth: Payer: Self-pay | Admitting: Cardiology

## 2020-10-11 NOTE — Telephone Encounter (Signed)
   [facility] Pre-operative Risk Assessment    Patient Name: Travis Howell  DOB: 07/07/1950 MRN: [id number]  HEARTCARE STAFF:  - IMPORTANT!!!!!! Under Visit Info/Reason for Call, type in Other and utilize the format Clearance MM/DD/YY or Clearance TBD. Do not use dashes or single digits. - Please review there is not already an duplicate clearance open for this procedure. - If request is for dental extraction, please clarify the # of teeth to be extracted. - If the patient is currently at the dentist's office, call Pre-Op Callback Staff (MA/nurse) to input urgent request.  - If the patient is not currently in the dentist office, please route to the Pre-Op pool.  Request for surgical clearance:  What type of surgery is being performed? Lithotripsy for kidney stones  When is this surgery scheduled? 10/14/20  What type of clearance is required (medical clearance vs. Pharmacy clearance to hold med vs. Both)? Both  Are there any medications that need to be held prior to surgery and how long? Eliquis for 48 hrs prior; Aspirin for 72 hrs prior  Practice name and name of physician performing surgery? [facility]; Dr. [name]  What is the office phone number? [phone] Ext. [phone]   7.   What is the office fax number? [phone]  8.   Anesthesia type (None, local, MAC, general) ? Local     Durel Salts 10/11/2020, 8:41 AM  _________________________________________________________________   (provider comments below)

## 2020-10-11 NOTE — Telephone Encounter (Signed)
Clinical pharmacist to review Eliquis.  Patient has a history of PAF who was recently referred to EP service in consideration of possible antiarrhythmic therapy versus ablation in the future, CAD, hypertension and hyperlipidemia.

## 2020-10-11 NOTE — Progress Notes (Signed)
Talked with pt's significant other. Instructions given meds and hx reviewed. Instructed to stop Eliquis today after 1000 dose. But to reach out to his cardiologist to make sure its okay. Arrival time 47 . Clear liquids 0715. SO will be the driver. Encouraged to wear mask over the weekend

## 2020-10-11 NOTE — Telephone Encounter (Signed)
Patient with diagnosis of afib on Eliquis for anticoagulation.    Procedure: lithotripsy for kidney stones Date of procedure: 10/14/20  CHA2DS2-VASc Score = 4  This indicates a 4.8% annual risk of stroke. The patient's score is based upon: CHF History: 0 HTN History: 1 Diabetes History: 1 Stroke History: 0 Vascular Disease History: 1 Age Score: 1 Gender Score: 0   CrCl 157mL/min using adjusted body weight Platelet count 398K  Per office protocol, patient can hold Eliquis for 2 days prior to procedure. There is mention of possible ablation or Tikosyn initiation - if either of those options are pursued, pt would need to be on uninterrupted anticoagulation for at least 3 weeks prior. Procedure is 10/10 and pt doesn't see Dr Curt Bears for eval until 10/24 so timing of procedure with anticoag hold should be fine - any potential procedure would just need to be 11/2 at the earliest assuming pt restarts Eliquis on 10/11.

## 2020-10-11 NOTE — Telephone Encounter (Signed)
   Name: Travis Howell  DOB: [birth year]  MRN: [id number]   Primary Cardiologist: Larae Grooms, MD  Chart reviewed as part of pre-operative protocol coverage. Patient was contacted 10/11/2020 in reference to pre-operative risk assessment for pending surgery as outlined below.  Travis Howell was last seen on 09/16/2020 by Dr. Irish Lack.  Since that day, Travis Howell has done well without exertional chest pain or worsening dyspnea.  He was recently admitted to the hospital with A. fib with RVR, heart rate is currently well controlled in the 80s and 90s based on the apple watch at home.  He is on metoprolol for rate control.  Patient has upcoming visit with our electrophysiologist.  He has no problem achieving 4 METS of activity.  Therefore, based on ACC/AHA guidelines, the patient would be at acceptable risk for the planned procedure without further cardiovascular testing.   He may hold Eliquis for 2 days and aspirin for 3 days prior to the procedure.  He may also hold fish oil prior to the procedure as well.  We recommend to restart above medication as soon as possible after the surgery at the discretion of the surgeon.  The patient was advised that if he develops new symptoms prior to surgery to contact our office to arrange for a follow-up visit, and he verbalized understanding.  I will route this recommendation to the requesting party via Epic fax function and remove from pre-op pool. Please call with questions.  Almyra Deforest, [place] 10/11/2020, 3:31 PM

## 2020-10-14 ENCOUNTER — Encounter (HOSPITAL_BASED_OUTPATIENT_CLINIC_OR_DEPARTMENT_OTHER): Payer: Self-pay | Admitting: Urology

## 2020-10-14 ENCOUNTER — Ambulatory Visit (HOSPITAL_BASED_OUTPATIENT_CLINIC_OR_DEPARTMENT_OTHER)
Admission: RE | Admit: 2020-10-14 | Discharge: 2020-10-14 | Disposition: A | Discharge status: Home / Self Care | Payer: Medicare Other | Attending: Urology | Admitting: Urology

## 2020-10-14 ENCOUNTER — Ambulatory Visit (HOSPITAL_COMMUNITY): Payer: Medicare Other

## 2020-10-14 ENCOUNTER — Other Ambulatory Visit: Payer: Self-pay

## 2020-10-14 ENCOUNTER — Encounter (HOSPITAL_BASED_OUTPATIENT_CLINIC_OR_DEPARTMENT_OTHER)
Admission: RE | Disposition: A | Discharge status: Home / Self Care | Payer: Self-pay | Source: Home / Self Care | Attending: Urology

## 2020-10-14 DIAGNOSIS — E119 Type 2 diabetes mellitus without complications: Secondary | ICD-10-CM | POA: Diagnosis not present

## 2020-10-14 DIAGNOSIS — Z96611 Presence of right artificial shoulder joint: Secondary | ICD-10-CM | POA: Insufficient documentation

## 2020-10-14 DIAGNOSIS — I499 Cardiac arrhythmia, unspecified: Secondary | ICD-10-CM | POA: Insufficient documentation

## 2020-10-14 DIAGNOSIS — N200 Calculus of kidney: Secondary | ICD-10-CM | POA: Diagnosis not present

## 2020-10-14 DIAGNOSIS — Z87440 Personal history of urinary (tract) infections: Secondary | ICD-10-CM | POA: Insufficient documentation

## 2020-10-14 DIAGNOSIS — Z96612 Presence of left artificial shoulder joint: Secondary | ICD-10-CM | POA: Insufficient documentation

## 2020-10-14 DIAGNOSIS — Z955 Presence of coronary angioplasty implant and graft: Secondary | ICD-10-CM | POA: Insufficient documentation

## 2020-10-14 DIAGNOSIS — I10 Essential (primary) hypertension: Secondary | ICD-10-CM | POA: Insufficient documentation

## 2020-10-14 DIAGNOSIS — Z888 Allergy status to other drugs, medicaments and biological substances status: Secondary | ICD-10-CM | POA: Diagnosis not present

## 2020-10-14 DIAGNOSIS — Z87891 Personal history of nicotine dependence: Secondary | ICD-10-CM | POA: Insufficient documentation

## 2020-10-14 DIAGNOSIS — E669 Obesity, unspecified: Secondary | ICD-10-CM | POA: Insufficient documentation

## 2020-10-14 DIAGNOSIS — I252 Old myocardial infarction: Secondary | ICD-10-CM | POA: Insufficient documentation

## 2020-10-14 DIAGNOSIS — Z7901 Long term (current) use of anticoagulants: Secondary | ICD-10-CM | POA: Insufficient documentation

## 2020-10-14 HISTORY — PX: EXTRACORPOREAL SHOCK WAVE LITHOTRIPSY: SHX1557

## 2020-10-14 LAB — GLUCOSE, CAPILLARY
Glucose-Capillary: 142 mg/dL — ABNORMAL HIGH (ref 70–99)
Glucose-Capillary: 117 mg/dL — ABNORMAL HIGH (ref 70–99)

## 2020-10-14 SURGERY — LITHOTRIPSY, ESWL
Anesthesia: LOCAL | Laterality: Right

## 2020-10-14 MED ORDER — SODIUM CHLORIDE 0.9 % IV SOLN: INTRAVENOUS | Status: DC

## 2020-10-14 MED ORDER — TRAMADOL HCL 50 MG PO TABS: 50.0000 mg | ORAL_TABLET | Freq: Four times a day (QID) | ORAL | 0 refills | Status: DC | PRN

## 2020-10-14 MED ORDER — CIPROFLOXACIN HCL 500 MG PO TABS
500.0000 mg | ORAL_TABLET | ORAL | Status: AC
  Administered 2020-10-14: 500 mg via ORAL

## 2020-10-14 MED ORDER — DIPHENHYDRAMINE HCL 25 MG PO CAPS
ORAL_CAPSULE | ORAL | Status: AC
  Filled 2020-10-14: qty 1

## 2020-10-14 MED ORDER — CIPROFLOXACIN HCL 500 MG PO TABS
ORAL_TABLET | ORAL | Status: AC
  Filled 2020-10-14: qty 1

## 2020-10-14 MED ORDER — DIAZEPAM 5 MG PO TABS
ORAL_TABLET | ORAL | Status: AC
  Filled 2020-10-14: qty 2

## 2020-10-14 MED ORDER — DIPHENHYDRAMINE HCL 25 MG PO CAPS
25.0000 mg | ORAL_CAPSULE | ORAL | Status: AC
  Administered 2020-10-14: 25 mg via ORAL

## 2020-10-14 MED ORDER — DIAZEPAM 5 MG PO TABS
10.0000 mg | ORAL_TABLET | ORAL | Status: AC
  Administered 2020-10-14: 10 mg via ORAL
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
## 2020-10-14 NOTE — Interval H&P Note (Signed)
History and Physical Interval Note:  10/14/2020 11:21 AM  Travis Howell  has presented today for surgery, with the diagnosis of RIGHT RENAL STONE.  The various methods of treatment have been discussed with the patient and family. After consideration of risks, benefits and other options for treatment, the patient has consented to  Procedure(s): EXTRACORPOREAL SHOCK WAVE LITHOTRIPSY (ESWL) (Right) as a surgical intervention.  The patient's history has been reviewed, patient examined, no change in status, stable for surgery.  I have reviewed the patient's chart and labs.  Questions were answered to the patient's satisfaction.     Lillette Boxer Maricella Filyaw

## 2020-10-14 NOTE — Interval H&P Note (Signed)
History and Physical Interval Note:  10/14/2020 11:06 AM  Travis Howell  has presented today for surgery, with the diagnosis of RIGHT RENAL STONE.  The various methods of treatment have been discussed with the patient and family. After consideration of risks, benefits and other options for treatment, the patient has consented to  Procedure(s): EXTRACORPOREAL SHOCK WAVE LITHOTRIPSY (ESWL) (Right) as a surgical intervention.  The patient's history has been reviewed, patient examined, no change in status, stable for surgery.  I have reviewed the patient's chart and labs.  Questions were answered to the patient's satisfaction.     Lillette Boxer Caitlyn Buchanan

## 2020-10-14 NOTE — Op Note (Signed)
See [facility] OP note scanned into chart. 

## 2020-10-14 NOTE — Discharge Instructions (Addendum)
See [facility] discharge instructions in chart.  Post Anesthesia Home Care Instructions  Activity: Get plenty of rest for the remainder of the day. A responsible adult should stay with you for 24 hours following the procedure.  For the next 24 hours, DO NOT: -Drive a car -Paediatric nurse -Drink alcoholic beverages -Take any medication unless instructed by your physician -Make any legal decisions or sign important papers.  Meals: Start with liquid foods such as gelatin or soup. Progress to regular foods as tolerated. Avoid greasy, spicy, heavy foods. If nausea and/or vomiting occur, drink only clear liquids until the nausea and/or vomiting subsides. Call your physician if vomiting continues.  Special Instructions/Symptoms: Your throat may feel dry or sore from the anesthesia or the breathing tube placed in your throat during surgery. If this causes discomfort, gargle with warm salt water. The discomfort should disappear within 24 hours.  If you had a scopolamine patch placed behind your ear for the management of post- operative nausea and/or vomiting:  1. The medication in the patch is effective for 72 hours, after which it should be removed.  Wrap patch in a tissue and discard in the trash. Wash hands thoroughly with soap and water. 2. You may remove the patch earlier than 72 hours if you experience unpleasant side effects which may include dry mouth, dizziness or visual disturbances. 3. Avoid touching the patch. Wash your hands with soap and water after contact with the patch.

## 2020-10-15 ENCOUNTER — Encounter (HOSPITAL_BASED_OUTPATIENT_CLINIC_OR_DEPARTMENT_OTHER): Payer: Self-pay | Admitting: Urology

## 2020-10-18 ENCOUNTER — Encounter: Payer: Self-pay | Admitting: Family Medicine

## 2020-10-28 ENCOUNTER — Other Ambulatory Visit: Payer: Self-pay

## 2020-10-28 ENCOUNTER — Encounter: Payer: Self-pay | Admitting: Cardiology

## 2020-10-28 ENCOUNTER — Ambulatory Visit: Payer: Medicare Other | Admitting: Cardiology

## 2020-10-28 VITALS — BP 116/66 | HR 101 | Ht 67.0 in | Wt 210.6 lb

## 2020-10-28 DIAGNOSIS — I4819 Other persistent atrial fibrillation: Secondary | ICD-10-CM
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
## 2020-10-28 NOTE — Patient Instructions (Signed)
Medication Instructions:  Your physician recommends that you continue on your current medications as directed. Please refer to the Current Medication list given to you today.  *If you need a refill on your cardiac medications before your next appointment, please call your pharmacy*   Lab Work: None ordered   Testing/Procedures: Your physician has recommended that you have an ablation. Catheter ablation is a medical procedure used to treat some cardiac arrhythmias (irregular heartbeats). During catheter ablation, a long, thin, flexible tube is put into a blood vessel in your groin (upper thigh), or neck. This tube is called an ablation catheter. It is then guided to your heart through the blood vessel. Radio frequency waves destroy small areas of heart tissue where abnormal heartbeats may cause an arrhythmia to start.   Please call the office if you would like to schedule this procedure.   Follow-Up: At [facility], you and your health needs are our priority.  As part of our continuing mission to provide you with exceptional heart care, we have created designated Provider Care Teams.  These Care Teams include your primary Cardiologist (physician) and Advanced Practice Providers (APPs -  Physician Assistants and Nurse Practitioners) who all work together to provide you with the care you need, when you need it.  Your next appointment:     to be determined  The format for your next appointment:   In Person  Provider:   Allegra Lai, MD    Thank you for choosing [facility]!!   Trinidad Curet, RN [phone]   Other Instructions   Cardiac Ablation Cardiac ablation is a procedure to destroy (ablate) some heart tissue that is sending bad signals. These bad signals cause problems in heart rhythm. The heart has many areas that make these signals. If there are problems in these areas, they can make the heart beat in a way that is not normal. Destroying some tissues can help make  the heart rhythm normal. Tell your doctor about: Any allergies you have. All medicines you are taking. These include vitamins, herbs, eye drops, creams, and over-the-counter medicines. Any problems you or family members have had with medicines that make you fall asleep (anesthetics). Any blood disorders you have. Any surgeries you have had. Any medical conditions you have, such as kidney failure. Whether you are pregnant or may be pregnant. What are the risks? This is a safe procedure. But problems may occur, including: Infection. Bruising and bleeding. Bleeding into the chest. Stroke or blood clots. Damage to nearby areas of your body. Allergies to medicines or dyes. The need for a pacemaker if the normal system is damaged. Failure of the procedure to treat the problem. What happens before the procedure? Medicines Ask your doctor about: Changing or stopping your normal medicines. This is important. Taking aspirin and ibuprofen. Do not take these medicines unless your doctor tells you to take them. Taking other medicines, vitamins, herbs, and supplements. General instructions Follow instructions from your doctor about what you cannot eat or drink. Plan to have someone take you home from the hospital or clinic. If you will be going home right after the procedure, plan to have someone with you for 24 hours. Ask your doctor what steps will be taken to prevent infection. What happens during the procedure?  An IV tube will be put into one of your veins. You will be given a medicine to help you relax. The skin on your neck or groin will be numbed. A cut (incision) will be  made in your neck or groin. A needle will be put through your cut and into a large vein. A tube (catheter) will be put into the needle. The tube will be moved to your heart. Dye may be put through the tube. This helps your doctor see your heart. Small devices (electrodes) on the tube will send out signals. A type of  energy will be used to destroy some heart tissue. The tube will be taken out. Pressure will be held on your cut. This helps stop bleeding. A bandage will be put over your cut. The exact procedure may vary among doctors and hospitals. What happens after the procedure? You will be watched until you leave the hospital or clinic. This includes checking your heart rate, breathing rate, oxygen, and blood pressure. Your cut will be watched for bleeding. You will need to lie still for a few hours. Do not drive for 24 hours or as long as your doctor tells you. Summary Cardiac ablation is a procedure to destroy some heart tissue. This is done to treat heart rhythm problems. Tell your doctor about any medical conditions you may have. Tell him or her about all medicines you are taking to treat them. This is a safe procedure. But problems may occur. These include infection, bruising, bleeding, and damage to nearby areas of your body. Follow what your doctor tells you about food and drink. You may also be told to change or stop some of your medicines. After the procedure, do not drive for 24 hours or as long as your doctor tells you. This information is not intended to replace advice given to you by your health care provider. Make sure you discuss any questions you have with your health care provider. Document Revised: 11/24/2018 Document Reviewed: 11/24/2018 Elsevier Patient Education  2022 [employer].

## 2020-10-29 ENCOUNTER — Telehealth: Payer: Self-pay | Admitting: Cardiology

## 2020-10-29 ENCOUNTER — Encounter (HOSPITAL_COMMUNITY): Payer: Self-pay | Admitting: Radiology

## 2020-10-29 NOTE — Telephone Encounter (Signed)
Patient's spouse is requesting to speak with Dr. Curt Bears' nurse to schedule an ablation, as discussed during office visit yesterday, 10/24.

## 2020-10-29 NOTE — Telephone Encounter (Signed)
Called [place] ([place] back about message. She stated that she needed to talk to Dr. Curt Bears nurse that she had missed a call from her. She wanted to let her know she wanted the spot for the ablation that the nurse had mentioned. Will forward to Dr. Curt Bears nurse to contact them when she is back in the office.

## 2020-11-01 ENCOUNTER — Telehealth: Payer: Self-pay | Admitting: Interventional Cardiology

## 2020-11-01 ENCOUNTER — Other Ambulatory Visit: Payer: Self-pay | Admitting: Urology

## 2020-11-01 NOTE — Telephone Encounter (Signed)
Left message to call back  

## 2020-11-01 NOTE — Telephone Encounter (Signed)
   [facility] Pre-operative Risk Assessment    Patient Name: Travis Howell  DOB: 1950-12-30 MRN: [id number]  HEARTCARE STAFF:  - IMPORTANT!!!!!! Under Visit Info/Reason for Call, type in Other and utilize the format Clearance MM/DD/YY or Clearance TBD. Do not use dashes or single digits. - Please review there is not already an duplicate clearance open for this procedure. - If request is for dental extraction, please clarify the # of teeth to be extracted. - If the patient is currently at the dentist's office, call Pre-Op Callback Staff (MA/nurse) to input urgent request.  - If the patient is not currently in the dentist office, please route to the Pre-Op pool.  Request for surgical clearance:  What type of surgery is being performed? Kidney Stone removed  When is this surgery scheduled? Pending  What type of clearance is required (medical clearance vs. Pharmacy clearance to hold med vs. Both)? both  Are there any medications that need to be held prior to surgery and how long?  Wants to stop  Eliquis 48 hrs prior  Practice name and name of physician performing surgery? Dr Travis Howell  What is the office phone number? [phone]   7.   What is the office fax number? [phone]  8.   Anesthesia type (None, local, MAC, general) ? Choice   Travis Howell 11/01/2020, 9:44 AM  _________________________________________________________________   (provider comments below)

## 2020-11-04 NOTE — Telephone Encounter (Signed)
   Name: Travis Howell  DOB: 23-Dec-1950  MRN: [id number]   Primary Cardiologist: Larae Grooms, MD  Chart reviewed as part of pre-operative protocol coverage.  Left a voicemail for patient to call back for ongoing preoperative evaluation.  Per pharmacy recommendations, patient can hold Eliquis 2 days prior to his upcoming procedure with plans to restart as soon as he is cleared to do so by his surgeon.  Will route to Dr. Curt Bears as well to coordinate timing of ablation as he will need to be on uninterrupted Eliquis 3 weeks prior to this procedure.   Abigail Butts, PA-C 11/04/2020, 10:12 AM

## 2020-11-04 NOTE — Telephone Encounter (Signed)
Patient with diagnosis of atrial fibrillation on Eliquis for anticoagulation.    Procedure: kidney stone removal Date of procedure: TBD   CHA2DS2-VASc Score = 4   This indicates a 4.8% annual risk of stroke. The patient's score is based upon: CHF History: 0 HTN History: 1 Diabetes History: 1 Stroke History: 0 Vascular Disease History: 1 Age Score: 1 Gender Score: 0   CrCl 101 (with adjusted body weight) Platelet count 398  Per office protocol, patient can hold Eliquis for 2 days prior to procedure.   Patient will not need bridging with Lovenox (enoxaparin) around procedure.  If patient plans to have ablation, will need to be back on Eliquis for at least 3 weeks before procedure.

## 2020-11-04 NOTE — Telephone Encounter (Signed)
   Name: Travis Howell  DOB: May 30, 1950  MRN: [id number]   Primary Cardiologist: Larae Grooms, MD  Chart reviewed as part of pre-operative protocol coverage. Patient was contacted 11/04/2020 in reference to pre-operative risk assessment for pending surgery as outlined below.  Travis Howell was last seen on 10/28/20 by Dr. Curt Bears.  Since that day, Travis Howell has done fine from a cardiac standpoint.  He can complete 4 METS without anginal complaints  Therefore, based on ACC/AHA guidelines, the patient would be at acceptable risk for the planned procedure without further cardiovascular testing.   The patient was advised that if he develops new symptoms prior to surgery to contact our office to arrange for a follow-up visit, and he verbalized understanding.  Per pharmacy recommendation, patient can hold Eliquis 2 days prior to his upcoming procedure with plans to restart as soon as he is cleared to do so by his urologist.  I will route this recommendation to the requesting party via [place] fax function and remove from pre-op pool. Please call with questions.  Abigail Butts, PA-C 11/04/2020, 11:58 AM

## 2020-11-06 ENCOUNTER — Telehealth: Payer: Self-pay | Admitting: Cardiology

## 2020-11-06 NOTE — Telephone Encounter (Signed)
Patient states that he is returning a call. Please advise

## 2020-11-06 NOTE — Telephone Encounter (Signed)
Spoke to husband. He reports a cystoscopy on 11/10 in which he will need to stop his Eliquis 2 days prior. Aware that ablations are currently out till January. He would like to arrange procedure for 01/17/2021. Aware I will be in touch with instructions. Pt agreeable to plan.

## 2020-11-07 ENCOUNTER — Other Ambulatory Visit: Payer: Self-pay | Admitting: Family Medicine

## 2020-11-07 NOTE — Telephone Encounter (Signed)
Colchicine Last filled:  09/27/20, #60 Last OV: 10/04/20, hosp f/u Next OV: 01/29/21, AWV

## 2020-11-08 NOTE — Telephone Encounter (Signed)
Patient's spouse states she is returning an additional call. She assumes it is additional information about his clearance. Please advise.

## 2020-11-08 NOTE — Telephone Encounter (Signed)
Lm to call back ./cy 

## 2020-11-11 ENCOUNTER — Other Ambulatory Visit: Payer: Self-pay

## 2020-11-11 ENCOUNTER — Encounter (HOSPITAL_BASED_OUTPATIENT_CLINIC_OR_DEPARTMENT_OTHER): Payer: Self-pay | Admitting: Urology

## 2020-11-11 NOTE — Progress Notes (Signed)
Spoke w/ via phone for pre-op interview---patient Lab needs dos---- ISTAT              Lab results------10/28/20 EKG in chart & Ignacio test -----patient states asymptomatic no test needed Arrive at -------0715 on 11/14/20 NPO after MN NO Solid Food.  Clear liquids from MN until---0615 Med rec completed Medications to take morning of surgery -----Albuterol inhaler (use & bring), Zetia, Metoprolol,Crestor, Rapaflo Diabetic medication -----Hold Metformin morning of surgery Patient instructed no nail polish to be worn day of surgery Patient instructed to bring photo id and insurance card day of surgery Patient aware to have Driver (ride ) / caregiver    for 24 hours after surgery - son Jeral Fruit Patient Special Instructions -----Hold Eliquis 48 hours prior to surgery. Last dose before surgery 11/11/20. Per Roby Lofts, PA 11/04/20 note in Epic & chart Pre-Op special Istructions -----none Patient verbalized understanding of instructions that were given at this phone interview. Patient denies shortness of breath, chest pain, fever, cough at this phone interview.   11/04/2020 Cardiac Clearance from Roby Lofts, PA in chart & Epic  Cardiologist / Electrophysiologist: Dr. Curt Bears LOV 10/28/20 in [place].  Cardiologist: Dr.Varanasi LOV 09/16/20

## 2020-11-12 ENCOUNTER — Encounter (HOSPITAL_BASED_OUTPATIENT_CLINIC_OR_DEPARTMENT_OTHER): Payer: Self-pay | Admitting: Urology
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
## 2020-11-14 ENCOUNTER — Encounter (HOSPITAL_BASED_OUTPATIENT_CLINIC_OR_DEPARTMENT_OTHER): Payer: Self-pay | Admitting: Urology

## 2020-11-14 ENCOUNTER — Other Ambulatory Visit: Payer: Self-pay

## 2020-11-14 ENCOUNTER — Ambulatory Visit (HOSPITAL_BASED_OUTPATIENT_CLINIC_OR_DEPARTMENT_OTHER): Payer: Medicare Other | Admitting: Anesthesiology

## 2020-11-14 ENCOUNTER — Ambulatory Visit (HOSPITAL_BASED_OUTPATIENT_CLINIC_OR_DEPARTMENT_OTHER)
Admission: RE | Admit: 2020-11-14 | Discharge: 2020-11-14 | Disposition: A | Discharge status: Home / Self Care | Payer: Medicare Other | Attending: Urology | Admitting: Urology

## 2020-11-14 ENCOUNTER — Encounter (HOSPITAL_BASED_OUTPATIENT_CLINIC_OR_DEPARTMENT_OTHER)
Admission: RE | Disposition: A | Discharge status: Home / Self Care | Payer: Self-pay | Source: Home / Self Care | Attending: Urology

## 2020-11-14 DIAGNOSIS — Z87891 Personal history of nicotine dependence: Secondary | ICD-10-CM | POA: Diagnosis not present

## 2020-11-14 DIAGNOSIS — N202 Calculus of kidney with calculus of ureter: Secondary | ICD-10-CM | POA: Insufficient documentation

## 2020-11-14 DIAGNOSIS — E119 Type 2 diabetes mellitus without complications: Secondary | ICD-10-CM | POA: Diagnosis not present

## 2020-11-14 HISTORY — PX: CYSTOSCOPY WITH RETROGRADE PYELOGRAM, URETEROSCOPY AND STENT PLACEMENT: SHX5789

## 2020-11-14 HISTORY — DX: Paroxysmal atrial fibrillation: I48.0

## 2020-11-14 HISTORY — PX: HOLMIUM LASER APPLICATION: SHX5852

## 2020-11-14 LAB — POCT I-STAT, CHEM 8
BUN: 13 mg/dL (ref 8–23)
Calcium, Ion: 1.2 mmol/L (ref 1.15–1.40)
Chloride: 103 mmol/L (ref 98–111)
Creatinine, Ser: 0.7 mg/dL (ref 0.61–1.24)
Glucose, Bld: 141 mg/dL — ABNORMAL HIGH (ref 70–99)
HCT: 36 % — ABNORMAL LOW (ref 39.0–52.0)
Hemoglobin: 12.2 g/dL — ABNORMAL LOW (ref 13.0–17.0)
Potassium: 3.8 mmol/L (ref 3.5–5.1)
Sodium: 142 mmol/L (ref 135–145)
TCO2: 26 mmol/L (ref 22–32)

## 2020-11-14 LAB — GLUCOSE, CAPILLARY: Glucose-Capillary: 136 mg/dL — ABNORMAL HIGH (ref 70–99)

## 2020-11-14 SURGERY — CYSTOURETEROSCOPY, WITH RETROGRADE PYELOGRAM AND STENT INSERTION
Anesthesia: General | Site: Pelvis | Laterality: Right

## 2020-11-14 MED ORDER — PHENYLEPHRINE 40 MCG/ML (10ML) SYRINGE FOR IV PUSH (FOR BLOOD PRESSURE SUPPORT)
PREFILLED_SYRINGE | INTRAVENOUS | Status: AC
  Filled 2020-11-14: qty 10

## 2020-11-14 MED ORDER — ACETAMINOPHEN 500 MG PO TABS
ORAL_TABLET | ORAL | Status: AC
  Filled 2020-11-14: qty 2

## 2020-11-14 MED ORDER — ONDANSETRON HCL 4 MG/2ML IJ SOLN
INTRAMUSCULAR | Status: AC
  Filled 2020-11-14: qty 2

## 2020-11-14 MED ORDER — 0.9 % SODIUM CHLORIDE (POUR BTL) OPTIME
TOPICAL | Status: DC | PRN
  Administered 2020-11-14: 500 mL

## 2020-11-14 MED ORDER — ACETAMINOPHEN 500 MG PO TABS
1000.0000 mg | ORAL_TABLET | Freq: Once | ORAL | Status: AC
  Administered 2020-11-14: 1000 mg via ORAL

## 2020-11-14 MED ORDER — ONDANSETRON HCL 4 MG/2ML IJ SOLN: 4.0000 mg | Freq: Once | INTRAMUSCULAR | Status: DC | PRN

## 2020-11-14 MED ORDER — FENTANYL CITRATE (PF) 100 MCG/2ML IJ SOLN
INTRAMUSCULAR | Status: AC
  Filled 2020-11-14: qty 2

## 2020-11-14 MED ORDER — DEXAMETHASONE SODIUM PHOSPHATE 10 MG/ML IJ SOLN
INTRAMUSCULAR | Status: AC
  Filled 2020-11-14: qty 1

## 2020-11-14 MED ORDER — DEXAMETHASONE SODIUM PHOSPHATE 10 MG/ML IJ SOLN
INTRAMUSCULAR | Status: DC | PRN
Start: 2020-11-14 — End: 2020-11-14
  Administered 2020-11-14: 10 mg via INTRAVENOUS

## 2020-11-14 MED ORDER — FENTANYL CITRATE (PF) 100 MCG/2ML IJ SOLN: 25.0000 ug | INTRAMUSCULAR | Status: DC | PRN

## 2020-11-14 MED ORDER — LIDOCAINE 2% (20 MG/ML) 5 ML SYRINGE
INTRAMUSCULAR | Status: AC
  Filled 2020-11-14: qty 5

## 2020-11-14 MED ORDER — PHENYLEPHRINE 40 MCG/ML (10ML) SYRINGE FOR IV PUSH (FOR BLOOD PRESSURE SUPPORT)
PREFILLED_SYRINGE | INTRAVENOUS | Status: DC | PRN
  Administered 2020-11-14: 160 ug via INTRAVENOUS
  Administered 2020-11-14 (×2): 80 ug via INTRAVENOUS

## 2020-11-14 MED ORDER — CEFAZOLIN SODIUM-DEXTROSE 2-4 GM/100ML-% IV SOLN
INTRAVENOUS | Status: AC
  Filled 2020-11-14: qty 100

## 2020-11-14 MED ORDER — EPHEDRINE 5 MG/ML INJ
INTRAVENOUS | Status: AC
  Filled 2020-11-14: qty 5

## 2020-11-14 MED ORDER — ONDANSETRON HCL 4 MG/2ML IJ SOLN
INTRAMUSCULAR | Status: DC | PRN
  Administered 2020-11-14: 4 mg via INTRAVENOUS

## 2020-11-14 MED ORDER — PROPOFOL 10 MG/ML IV BOLUS
INTRAVENOUS | Status: DC | PRN
  Administered 2020-11-14: 160 mg via INTRAVENOUS

## 2020-11-14 MED ORDER — LIDOCAINE 2% (20 MG/ML) 5 ML SYRINGE
INTRAMUSCULAR | Status: DC | PRN
  Administered 2020-11-14: 80 mg via INTRAVENOUS

## 2020-11-14 MED ORDER — EPHEDRINE SULFATE-NACL 50-0.9 MG/10ML-% IV SOSY
PREFILLED_SYRINGE | INTRAVENOUS | Status: DC | PRN
  Administered 2020-11-14: 5 mg via INTRAVENOUS

## 2020-11-14 MED ORDER — SODIUM CHLORIDE 0.9 % IR SOLN
Status: DC | PRN
  Administered 2020-11-14: 2500 mL

## 2020-11-14 MED ORDER — LACTATED RINGERS IV SOLN: INTRAVENOUS | Status: DC

## 2020-11-14 MED ORDER — FENTANYL CITRATE (PF) 100 MCG/2ML IJ SOLN
INTRAMUSCULAR | Status: DC | PRN
  Administered 2020-11-14: 25 ug via INTRAVENOUS
  Administered 2020-11-14: 50 ug via INTRAVENOUS
  Administered 2020-11-14: 25 ug via INTRAVENOUS
  Administered 2020-11-14: 50 ug via INTRAVENOUS

## 2020-11-14 MED ORDER — PROPOFOL 10 MG/ML IV BOLUS
INTRAVENOUS | Status: AC
  Filled 2020-11-14: qty 20

## 2020-11-14 MED ORDER — CEFAZOLIN SODIUM-DEXTROSE 2-4 GM/100ML-% IV SOLN
2.0000 g | INTRAVENOUS | Status: AC
  Administered 2020-11-14: 2 g via INTRAVENOUS

## 2020-11-14 SURGICAL SUPPLY — 28 items
BAG DRAIN URO-CYSTO SKYTR STRL (DRAIN) ×3 IMPLANT
BAG DRN UROCATH (DRAIN) ×1
BASKET STONE 1.7 NGAGE (UROLOGICAL SUPPLIES) ×3 IMPLANT
BASKET ZERO TIP NITINOL 2.4FR (BASKET) ×3 IMPLANT
BSKT STON RTRVL ZERO TP 2.4FR (BASKET) ×1
CATH INTERMIT  6FR 70CM (CATHETERS) ×3 IMPLANT
CLOTH BEACON ORANGE TIMEOUT ST (SAFETY) ×3 IMPLANT
COVER DOME SNAP 22 D (MISCELLANEOUS) ×3 IMPLANT
ELECT REM PT RETURN 9FT ADLT (ELECTROSURGICAL)
ELECTRODE REM PT RTRN 9FT ADLT (ELECTROSURGICAL) IMPLANT
FIBER LASER FLEXIVA 200 (UROLOGICAL SUPPLIES) IMPLANT
FIBER LASER FLEXIVA 365 (UROLOGICAL SUPPLIES) ×3 IMPLANT
GLOVE SURG ENC MOIS LTX SZ8 (GLOVE) ×3 IMPLANT
GOWN STRL REUS W/TWL LRG LVL3 (GOWN DISPOSABLE) ×3 IMPLANT
GUIDEWIRE ANG ZIPWIRE 038X150 (WIRE) IMPLANT
GUIDEWIRE STR DUAL SENSOR (WIRE) ×6 IMPLANT
IV NS IRRIG 3000ML ARTHROMATIC (IV SOLUTION) ×3 IMPLANT
KIT TURNOVER CYSTO (KITS) ×3 IMPLANT
MANIFOLD NEPTUNE II (INSTRUMENTS) ×3 IMPLANT
NS IRRIG 500ML POUR BTL (IV SOLUTION) ×3 IMPLANT
PACK CYSTO (CUSTOM PROCEDURE TRAY) ×3 IMPLANT
SHEATH URETERAL 12FRX35CM (MISCELLANEOUS) ×3 IMPLANT
STENT URET 6FRX26 CONTOUR (STENTS) ×3 IMPLANT
TRACTIP FLEXIVA PULS ID 200XHI (Laser) IMPLANT
TRACTIP FLEXIVA PULSE ID 200 (Laser)
TUBE CONNECTING 12'X1/4 (SUCTIONS) ×1
TUBE CONNECTING 12X1/4 (SUCTIONS) ×2 IMPLANT
TUBING UROLOGY SET (TUBING) ×3 IMPLANT

## 2020-11-14 NOTE — Op Note (Signed)
Preoperative diagnosis: Right upper ureteral stone, status post stenting and shockwave lithotripsy with residual fragments  Postoperative diagnosis: Same  Principal procedure: Cystoscopy, right double-J stent extraction/replacement (6 [place] by 26 cm without tether), right ureteroscopy, holmium laser lithotripsy and extraction of stone, fluoroscopic use  Surgeon: Jasmaine Rochel  Anesthesia: General with LMA  Complications: None  Estimated blood loss: Less than 50 mL  Specimen: Stone fragments  Indications: 70 year old male status post stenting of an infected upper ureteral stone, followed by shockwave lithotripsy in late September and early October, respectively.  Follow-up is revealed persistence of the stone which appears to be fragmented.  However, there has not been progression of the stone distally.  He presents at this time for definitive ureteroscopic management.  Findings: Urethra was without lesions.  Prostate was nonobstructive.  Bladder was inspected and found to be normal without urothelial lesions.  Minimal trabeculations.  Ureteral orifices normal in location.  Double-J stent present at right ureter.  The ureter surrounding the impacted stone in the UPJ area revealed inflammatory changes.  Stone fragments were adherent to the ureteral wall.  Mild pyelocaliectasis.  No urothelial lesions of the pyelocalyceal system.  Description of procedure: The patient was properly identified and marked in the holding area.  He was taken to the operating room where general anesthetic was was administered using the LMA.  He was placed in the dorsolithotomy position.  Genitalia and perineum were prepped, draped, proper timeout performed.  21 French panendoscope advanced into the bladder.  The ureter was grasped and brought out through the meatus.  It was then cannulated with the sensor tip guidewire which was advanced into the upper pole calyceal system using fluoroscopic guidance.  The double-J stent  was then removed.  I then sequentially dilated the ureter with first the obturator then the entire 12/14 medium length ureteral access catheter.  This was removed.  I then passed the long semirigid dual ureteroscope.  This easily passed up to the UPJ were a significant amount of stone matter was present.  The largest fragment was immediately placed and was quite adherent to the ureteral wall.  Several smaller fragments were lateral on the ureter.  A 360 m fiber was advanced up to this area, and using holmium laser energy all the stone fragments were gently notched off the ureteral wall and fragmented.  These fragments passed up into the renal pelvis.  At this point, the semirigid scope was removed, and after a safety wire was placed, the ureteral access catheter was replaced.  The flexible dual-lumen ureteroscope was then advanced into the renal pelvis.  Most of the stone fragments had migrated into an interpolar posterior calyx and an upper pole or posterior calyx.  This facilitated further fragmentation of all stone fragments located in these calyces.  The upper pole calyceal stones were "dusted", with no significant large fragments noted.  I left these in place without extraction as they would easily pass distally along the eventually placed stent.  The interpolar fragments were extracted using the engage basket.  All but the Tiny fragments were removed.  Inspection of all the other calyces revealed no significant stone matter.  At this point, the ureteroscope was removed.  The access catheter was also removed.  The guidewire was backloaded through the cystoscope and a 26 cm x 6 French contour double-J stent was replaced using fluoroscopic and cystoscopic guidance.  Once adequately deployed excellent proximal and distal curls were seen.  The tether was removed.  At this point the  procedure was terminated.  The bladder was drained, the scope removed and the patient was awakened.  He was taken to the PACU in  stable condition, having tolerated the procedure well.  The patient will follow-up in our office.  KUB will be performed, and hopefully, at his follow-up appointment cystoscopic removal of the stent will be performed.

## 2020-11-14 NOTE — Discharge Instructions (Addendum)
You may see some blood in the urine and may have some burning with urination for 48-72 hours. You also may notice that you have to urinate more frequently or urgently after your procedure which is normal.  You should call should you develop an inability urinate, fever > 101, persistent nausea and vomiting that prevents you from eating or drinking to stay hydrated.  If you have a stent, you will likely urinate more frequently and urgently until the stent is removed and you may experience some discomfort/pain in the lower abdomen and flank especially when urinating. You may take pain medication prescribed to you if needed for pain. You may also intermittently have blood in the urine until the stent is removed.  The stent needs to be in quite a few days because of the inflammation around where the stone was.  We will remove it in the office. It is okay to restart the Eliquis when you are urine has cleared Continue the cephalexin once a day until it runs out   Post Anesthesia Home Care Instructions  Activity: Get plenty of rest for the remainder of the day. A responsible individual must stay with you for 24 hours following the procedure.  For the next 24 hours, DO NOT: -Drive a car -Paediatric nurse -Drink alcoholic beverages -Take any medication unless instructed by your physician -Make any legal decisions or sign important papers.  Meals: Start with liquid foods such as gelatin or soup. Progress to regular foods as tolerated. Avoid greasy, spicy, heavy foods. If nausea and/or vomiting occur, drink only clear liquids until the nausea and/or vomiting subsides. Call your physician if vomiting continues.  Special Instructions/Symptoms: Your throat may feel dry or sore from the anesthesia or the breathing tube placed in your throat during surgery. If this causes discomfort, gargle with warm salt water. The discomfort should disappear within 24 hours.

## 2020-11-14 NOTE — Anesthesia Procedure Notes (Signed)
Procedure Name: LMA Insertion Date/Time: 11/14/2020 8:41 AM Performed by: Ilian Wessell D, CRNA Pre-anesthesia Checklist: Patient identified, Emergency Drugs available, Suction available and Patient being monitored Patient Re-evaluated:Patient Re-evaluated prior to induction Oxygen Delivery Method: Circle system utilized Preoxygenation: Pre-oxygenation with 100% oxygen Induction Type: IV induction Ventilation: Mask ventilation without difficulty LMA: LMA inserted LMA Size: 4.0 Tube type: Oral Number of attempts: 1 Placement Confirmation: positive ETCO2 and breath sounds checked- equal and bilateral Tube secured with: Tape Dental Injury: Teeth and Oropharynx as per pre-operative assessment

## 2020-11-14 NOTE — Interval H&P Note (Signed)
History and Physical Interval Note:  11/14/2020 8:33 AM  Travis Howell  has presented today for surgery, with the diagnosis of RIGHT URETERAL AND RENAL CALCULI.  The various methods of treatment have been discussed with the patient and family. After consideration of risks, benefits and other options for treatment, the patient has consented to  Procedure(s): CYSTOSCOPY WITH RETROGRADE PYELOGRAM, URETEROSCOPY, STONE EXTRACTION AND STENT PLACEMENT (Right) HOLMIUM LASER APPLICATION (Right) as a surgical intervention.  The patient's history has been reviewed, patient examined, no change in status, stable for surgery.  I have reviewed the patient's chart and labs.  Questions were answered to the patient's satisfaction.     Travis Howell

## 2020-11-14 NOTE — Transfer of Care (Signed)
Immediate Anesthesia Transfer of Care Note  Patient: Travis Howell  Procedure(s) Performed: CYSTOSCOPY WITH RETROGRADE PYELOGRAM, URETEROSCOPY, STONE EXTRACTION AND STENT EXCHANGE (Right: Pelvis) HOLMIUM LASER APPLICATION (Right: Pelvis)  Patient Location: PACU  Anesthesia Type:General  Level of Consciousness: awake, alert  and oriented  Airway & Oxygen Therapy: Patient Spontanous Breathing and Patient connected to nasal cannula oxygen  Post-op Assessment: Report given to RN and Post -op Vital signs reviewed and stable  Post vital signs: Reviewed and stable  Last Vitals:  Vitals Value Taken Time  BP 130/93 11/14/20 1022  Temp 36.7 C 11/14/20 1022  Pulse 104 11/14/20 1023  Resp 15 11/14/20 1023  SpO2 96 % 11/14/20 1023  Vitals shown include unvalidated device data.  Last Pain:  Vitals:   11/14/20 0735  TempSrc: Oral  PainSc: 0-No pain      Patients Stated Pain Goal: 5 (03/19/92 5859)  Complications: No notable events documented.

## 2020-11-14 NOTE — Anesthesia Preprocedure Evaluation (Addendum)
Anesthesia Evaluation  Patient identified by MRN, date of birth, ID band Patient awake    Reviewed: Allergy & Precautions, NPO status , Patient's Chart, lab work & pertinent test results  Airway Mallampati: II  TM Distance: >3 FB Neck ROM: Full    Dental  (+) Teeth Intact, Dental Advisory Given, Caps   Pulmonary neg pulmonary ROS, former smoker,    Pulmonary exam normal breath sounds clear to auscultation       Cardiovascular hypertension, + CAD, + Past MI and + Cardiac Stents  Normal cardiovascular exam+ dysrhythmias Atrial Fibrillation  Rhythm:Regular Rate:Normal  Echo 09/27/20: 1. Left ventricular ejection fraction, by estimation, is 50 to 55%. The  left ventricle has low normal function. The left ventricle has no regional  wall motion abnormalities. Left ventricular diastolic function could not  be evaluated.  2. Right ventricular systolic function is normal. The right ventricular  size is normal. There is mildly elevated pulmonary artery systolic  pressure.  3. Left atrial size was moderately dilated.  4. Right atrial size was moderately dilated.  5. The mitral valve is normal in structure. Trivial mitral valve  regurgitation. No evidence of mitral stenosis.  6. The aortic valve is grossly normal. Aortic valve regurgitation is not  visualized. No aortic stenosis is present.  7. The inferior vena cava is dilated in size with <50% respiratory  variability, suggesting right atrial pressure of 15 mmHg.    Neuro/Psych PSYCHIATRIC DISORDERS Anxiety Depression negative neurological ROS     GI/Hepatic negative GI ROS, Neg liver ROS,   Endo/Other  diabetes, Well Controlled, Type 2, Oral Hypoglycemic AgentsObesity   Renal/GU RIGHT URETERAL AND RENAL CALCULI     Musculoskeletal  (+) Arthritis ,   Abdominal   Peds  Hematology  (+) Blood dyscrasia (Eliquis), anemia ,   Anesthesia Other Findings Day of surgery  medications reviewed with the patient.  Reproductive/Obstetrics                            Anesthesia Physical Anesthesia Plan  ASA: 3  Anesthesia Plan: General   Post-op Pain Management:    Induction: Intravenous  PONV Risk Score and Plan: 3 and Dexamethasone, Ondansetron and Treatment may vary due to age or medical condition  Airway Management Planned: LMA  Additional Equipment:   Intra-op Plan:   Post-operative Plan: Extubation in OR  Informed Consent: I have reviewed the patients History and Physical, chart, labs and discussed the procedure including the risks, benefits and alternatives for the proposed anesthesia with the patient or authorized representative who has indicated his/her understanding and acceptance.     Dental advisory given  Plan Discussed with: CRNA  Anesthesia Plan Comments:         Anesthesia Quick Evaluation

## 2020-11-14 NOTE — Anesthesia Postprocedure Evaluation (Signed)
Anesthesia Post Note  Patient: Travis Howell  Procedure(s) Performed: CYSTOSCOPY WITH RETROGRADE PYELOGRAM, URETEROSCOPY, STONE EXTRACTION AND STENT EXCHANGE (Right: Pelvis) HOLMIUM LASER APPLICATION (Right: Pelvis)     Patient location during evaluation: PACU Anesthesia Type: General Level of consciousness: awake and alert Pain management: pain level controlled Vital Signs Assessment: post-procedure vital signs reviewed and stable Respiratory status: spontaneous breathing, nonlabored ventilation and respiratory function stable Cardiovascular status: blood pressure returned to baseline and stable Postop Assessment: no apparent nausea or vomiting Anesthetic complications: no   No notable events documented.  Last Vitals:  Vitals:   11/14/20 1045 11/14/20 1110  BP: 116/76 (!) 131/97  Pulse: 96 95  Resp: 14 15  Temp:  36.5 C  SpO2: 93% 95%    Last Pain:  Vitals:   11/14/20 1110  TempSrc:   PainSc: 0-No pain                 Catalina Gravel

## 2020-11-15 ENCOUNTER — Encounter (HOSPITAL_BASED_OUTPATIENT_CLINIC_OR_DEPARTMENT_OTHER): Payer: Self-pay | Admitting: Urology

## 2020-12-06 ENCOUNTER — Telehealth: Payer: Self-pay | Admitting: Interventional Cardiology

## 2020-12-06 ENCOUNTER — Telehealth: Payer: Self-pay | Admitting: Family Medicine

## 2020-12-06 DIAGNOSIS — Z5181 Encounter for therapeutic drug level monitoring: Secondary | ICD-10-CM

## 2020-12-06 DIAGNOSIS — I4891 Unspecified atrial fibrillation: Secondary | ICD-10-CM

## 2020-12-06 MED ORDER — APIXABAN 5 MG PO TABS
5.0000 mg | ORAL_TABLET | Freq: Two times a day (BID) | ORAL | Status: DC
Start: 2020-12-06 — End: 2020-12-06

## 2020-12-06 MED ORDER — APIXABAN 5 MG PO TABS: 5.0000 mg | ORAL_TABLET | Freq: Two times a day (BID) | ORAL | 1 refills | Status: DC

## 2020-12-06 NOTE — Telephone Encounter (Signed)
Pt would like a call back to discuss his eliquis 5mg  rx

## 2020-12-06 NOTE — Telephone Encounter (Signed)
Pt called and said his cardiologist is going to get the medication eliquis for him and it will be sent through the pharmacy mail delivery service

## 2020-12-06 NOTE — Telephone Encounter (Signed)
*  STAT* If patient is at the pharmacy, call can be transferred to refill team.   1. Which medications need to be refilled? (please list name of each medication and dose if known) new prescription for Eliquis 5 mg  2. Which pharmacy/location (including street and city if local pharmacy) is medication to be sent to? [facility]  3. Do they need a 30 day or 90 day supply? 90 days and refills

## 2020-12-06 NOTE — Telephone Encounter (Signed)
Prescription refill request for Eliquis received. Indication: Afib  Last office visit:10/28/20 ([place])  Scr: 0.76 (10/04/20) Age: 70 Weight: 91.5kg  Appropriate dose and refill sent to requested pharmacy.

## 2020-12-06 NOTE — Telephone Encounter (Signed)
Noted  

## 2020-12-17 ENCOUNTER — Telehealth: Payer: Self-pay | Admitting: *Deleted

## 2020-12-17 DIAGNOSIS — I4819 Other persistent atrial fibrillation: Secondary | ICD-10-CM

## 2020-12-17 DIAGNOSIS — Z01812 Encounter for preprocedural laboratory examination: Secondary | ICD-10-CM

## 2020-12-17 NOTE — Telephone Encounter (Signed)
Left message to call back to go over next months procedure instructions.

## 2020-12-23 ENCOUNTER — Telehealth: Payer: Self-pay

## 2020-12-23 MED ORDER — METOPROLOL SUCCINATE ER 50 MG PO TB24: 75.0000 mg | ORAL_TABLET | Freq: Two times a day (BID) | ORAL | 0 refills | Status: DC

## 2020-12-23 MED ORDER — METOPROLOL SUCCINATE ER 50 MG PO TB24: 75.0000 mg | ORAL_TABLET | Freq: Two times a day (BID) | ORAL | 1 refills | Status: DC

## 2020-12-23 NOTE — Telephone Encounter (Signed)
Received a call from the patient requesting a refill on Metoprolol succinate. He states he will run out of medication today.   Chart reviewed.  7 day rx has been [facility] to [facility] and 90 day supply has been sent to the mail order pharmacy.  Patient voiced understanding.

## 2020-12-27 NOTE — Telephone Encounter (Signed)
Left detailed message for pt. Informed that I would send CT & Ablation instructions via mychart. Asked pt to call office to verbally go over instructions.  Made aware I would be out of the office until 1/4, but that another EP RN can help him if he cannot call until next week.

## 2021-01-09 ENCOUNTER — Telehealth (HOSPITAL_COMMUNITY): Payer: Self-pay | Admitting: Emergency Medicine

## 2021-01-09 ENCOUNTER — Other Ambulatory Visit: Payer: Self-pay

## 2021-01-09 ENCOUNTER — Other Ambulatory Visit: Payer: Medicare Other | Admitting: *Deleted

## 2021-01-09 DIAGNOSIS — I4819 Other persistent atrial fibrillation: Secondary | ICD-10-CM

## 2021-01-09 DIAGNOSIS — Z01812 Encounter for preprocedural laboratory examination: Secondary | ICD-10-CM

## 2021-01-09 LAB — CBC
Hematocrit: 38 % (ref 37.5–51.0)
Hemoglobin: 13.5 g/dL (ref 13.0–17.7)
MCH: 33.5 pg — ABNORMAL HIGH (ref 26.6–33.0)
MCHC: 35.5 g/dL (ref 31.5–35.7)
MCV: 94 fL (ref 79–97)
Platelets: 204 10*3/uL (ref 150–450)
RBC: 4.03 x10E6/uL — ABNORMAL LOW (ref 4.14–5.80)
RDW: 12.8 % (ref 11.6–15.4)
WBC: 5 10*3/uL (ref 3.4–10.8)

## 2021-01-09 LAB — BASIC METABOLIC PANEL
BUN/Creatinine Ratio: 15 (ref 10–24)
BUN: 12 mg/dL (ref 8–27)
CO2: 26 mmol/L (ref 20–29)
Calcium: 9.2 mg/dL (ref 8.6–10.2)
Chloride: 102 mmol/L (ref 96–106)
Creatinine, Ser: 0.79 mg/dL (ref 0.76–1.27)
Glucose: 125 mg/dL — ABNORMAL HIGH (ref 70–99)
Potassium: 4.5 mmol/L (ref 3.5–5.2)
Sodium: 139 mmol/L (ref 134–144)
eGFR: 96 mL/min/{1.73_m2} (ref >59)

## 2021-01-09 NOTE — Telephone Encounter (Signed)
Verbally spoke with pt. Pt will stop by the [facility] office today for blood work. Reviewed CT & procedure instructions. Patient verbalized understanding and agreeable to plan.

## 2021-01-09 NOTE — Addendum Note (Signed)
Addended by: Stanton Kidney on: 01/09/2021 09:17 AM   Modules accepted: Orders

## 2021-01-09 NOTE — Telephone Encounter (Signed)
Reaching out to patient to offer assistance regarding upcoming cardiac imaging study; pt verbalizes understanding of appt date/time, parking situation and where to check in, pre-test NPO status and medications ordered, and verified current allergies; name and call back number provided for further questions should they arise Marchia Bond RN [place] [facility] [phone] office [phone] cell  Daily meds per [place] note Denies iv issues Arrival 730a

## 2021-01-10 ENCOUNTER — Ambulatory Visit (HOSPITAL_COMMUNITY)
Admission: RE | Admit: 2021-01-10 | Discharge: 2021-01-10 | Disposition: A | Discharge status: Home / Self Care | Payer: Medicare Other | Source: Ambulatory Visit | Attending: Cardiology | Admitting: Cardiology

## 2021-01-10 DIAGNOSIS — I4819 Other persistent atrial fibrillation: Secondary | ICD-10-CM | POA: Insufficient documentation

## 2021-01-10 MED ORDER — IOHEXOL 350 MG/ML SOLN
100.0000 mL | Freq: Once | INTRAVENOUS | Status: AC | PRN
  Administered 2021-01-10: 100 mL via INTRAVENOUS

## 2021-01-10 MED ORDER — METOPROLOL TARTRATE 5 MG/5ML IV SOLN
5.0000 mg | INTRAVENOUS | Status: DC | PRN
  Administered 2021-01-10: 10 mg via INTRAVENOUS

## 2021-01-10 MED ORDER — DILTIAZEM HCL 25 MG/5ML IV SOLN: 5.0000 mg | INTRAVENOUS | Status: DC | PRN

## 2021-01-10 MED ORDER — METOPROLOL TARTRATE 5 MG/5ML IV SOLN
INTRAVENOUS | Status: AC
  Filled 2021-01-10: qty 10

## 2021-01-10 MED ORDER — DILTIAZEM HCL 25 MG/5ML IV SOLN
INTRAVENOUS | Status: AC
  Filled 2021-01-10: qty 5

## 2021-01-15 ENCOUNTER — Telehealth: Payer: Self-pay | Admitting: Cardiology

## 2021-01-15 NOTE — Telephone Encounter (Signed)
Patient has developed a sore throat and fever. He will need to r/s his ablation that is scheduled for tomorrow

## 2021-01-15 NOTE — Telephone Encounter (Signed)
Returned call to Pt's wife.  Pt has sore throat and fever and needs to reschedule ablation.  Pt has completed cardiac CT.  Advised would cancel ablation at this time and contact at a later date to reschedule.  Advised Pt should test for covid and follow up with PCP as needed.  Wife thanked nurse for call back.

## 2021-01-17 ENCOUNTER — Encounter (HOSPITAL_COMMUNITY): Admission: RE | Payer: Self-pay | Source: Home / Self Care

## 2021-01-17 ENCOUNTER — Ambulatory Visit (HOSPITAL_COMMUNITY): Admission: RE | Admit: 2021-01-17 | Payer: Medicare Other | Source: Home / Self Care | Admitting: Cardiology

## 2021-01-17 SURGERY — ATRIAL FIBRILLATION ABLATION
Anesthesia: General

## 2021-01-18 ENCOUNTER — Other Ambulatory Visit: Payer: Self-pay | Admitting: Family Medicine

## 2021-01-18 DIAGNOSIS — I4891 Unspecified atrial fibrillation: Secondary | ICD-10-CM

## 2021-01-18 DIAGNOSIS — E118 Type 2 diabetes mellitus with unspecified complications: Secondary | ICD-10-CM

## 2021-01-18 DIAGNOSIS — N400 Enlarged prostate without lower urinary tract symptoms: Secondary | ICD-10-CM

## 2021-01-18 DIAGNOSIS — E785 Hyperlipidemia, unspecified: Secondary | ICD-10-CM

## 2021-01-18 DIAGNOSIS — E1169 Type 2 diabetes mellitus with other specified complication: Secondary | ICD-10-CM

## 2021-01-22 ENCOUNTER — Other Ambulatory Visit: Payer: Self-pay

## 2021-01-22 ENCOUNTER — Other Ambulatory Visit (INDEPENDENT_AMBULATORY_CARE_PROVIDER_SITE_OTHER): Payer: Medicare Other

## 2021-01-22 DIAGNOSIS — N400 Enlarged prostate without lower urinary tract symptoms: Secondary | ICD-10-CM

## 2021-01-22 DIAGNOSIS — E118 Type 2 diabetes mellitus with unspecified complications: Secondary | ICD-10-CM | POA: Diagnosis not present

## 2021-01-22 DIAGNOSIS — E785 Hyperlipidemia, unspecified: Secondary | ICD-10-CM

## 2021-01-22 DIAGNOSIS — E1169 Type 2 diabetes mellitus with other specified complication: Secondary | ICD-10-CM

## 2021-01-22 LAB — COMPREHENSIVE METABOLIC PANEL
ALT: 16 U/L (ref 0–53)
AST: 21 U/L (ref 0–37)
Albumin: 4 g/dL (ref 3.5–5.2)
Alkaline Phosphatase: 56 U/L (ref 39–117)
BUN: 15 mg/dL (ref 6–23)
CO2: 29 mEq/L (ref 19–32)
Calcium: 8.7 mg/dL (ref 8.4–10.5)
Chloride: 103 mEq/L (ref 96–112)
Creatinine, Ser: 0.79 mg/dL (ref 0.40–1.50)
GFR: 90.76 mL/min (ref >60.00)
Glucose, Bld: 132 mg/dL — ABNORMAL HIGH (ref 70–99)
Potassium: 4.2 mEq/L (ref 3.5–5.1)
Sodium: 141 mEq/L (ref 135–145)
Total Bilirubin: 0.4 mg/dL (ref 0.2–1.2)
Total Protein: 6.8 g/dL (ref 6.0–8.3)

## 2021-01-22 LAB — MICROALBUMIN / CREATININE URINE RATIO
Creatinine,U: 136.2 mg/dL
Microalb Creat Ratio: 2.3 mg/g (ref 0.0–30.0)
Microalb, Ur: 3.1 mg/dL — ABNORMAL HIGH (ref 0.0–1.9)

## 2021-01-22 LAB — LIPID PANEL
Cholesterol: 124 mg/dL (ref 0–200)
HDL: 30.7 mg/dL — ABNORMAL LOW (ref >39.00)
LDL Cholesterol: 78 mg/dL (ref 0–99)
NonHDL: 93.37
Total CHOL/HDL Ratio: 4
Triglycerides: 78 mg/dL (ref 0.0–149.0)
VLDL: 15.6 mg/dL (ref 0.0–40.0)

## 2021-01-22 LAB — HEMOGLOBIN A1C: Hgb A1c MFr Bld: 6.7 % — ABNORMAL HIGH (ref 4.6–6.5)

## 2021-01-22 LAB — PSA: PSA: 0.21 ng/mL (ref 0.10–4.00)
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
## 2021-01-23 ENCOUNTER — Ambulatory Visit (INDEPENDENT_AMBULATORY_CARE_PROVIDER_SITE_OTHER): Payer: Medicare Other

## 2021-01-23 VITALS — Ht 67.0 in | Wt 205.0 lb

## 2021-01-23 DIAGNOSIS — Z0000 Encounter for general adult medical examination without abnormal findings: Secondary | ICD-10-CM | POA: Diagnosis not present

## 2021-01-23 NOTE — Patient Instructions (Signed)
Travis Howell , Thank you for taking time to complete your Medicare Wellness Visit. I appreciate your ongoing commitment to your health goals. Please review the following plan we discussed and let me know if I can assist you in the future.   Screening recommendations/referrals: Colonoscopy: up to date , completed 11/24/17, due 11/25/22 Recommended yearly ophthalmology/optometry visit for glaucoma screening and checkup Recommended yearly dental visit for hygiene and checkup  Vaccinations: Influenza vaccine: up to date Pneumococcal vaccine: up to date  Tdap vaccine: up to date, completed 10/08/16, due 10/09/26 Shingles vaccine: up to date   Covid-19: newest booster available at your local pharmacy  Advanced directives: copy on file  Conditions/risks identified: see problem list  Next appointment: Follow up in one year for your annual wellness visit. 01/26/22 @ 8:15am, this will be a telephone visit  Preventive Care 66 Years and Older, Male Preventive care refers to lifestyle choices and visits with your health care provider that can promote health and wellness. What does preventive care include? A yearly physical exam. This is also called an annual well check. Dental exams once or twice a year. Routine eye exams. Ask your health care provider how often you should have your eyes checked. Personal lifestyle choices, including: Daily care of your teeth and gums. Regular physical activity. Eating a healthy diet. Avoiding tobacco and drug use. Limiting alcohol use. Practicing safe sex. Taking low doses of aspirin every day. Taking vitamin and mineral supplements as recommended by your health care provider. What happens during an annual well check? The services and screenings done by your health care provider during your annual well check will depend on your age, overall health, lifestyle risk factors, and family history of disease. Counseling  Your health care provider may ask you  questions about your: Alcohol use. Tobacco use. Drug use. Emotional well-being. Home and relationship well-being. Sexual activity. Eating habits. History of falls. Memory and ability to understand (cognition). Work and work Statistician. Screening  You may have the following tests or measurements: Height, weight, and BMI. Blood pressure. Lipid and cholesterol levels. These may be checked every 5 years, or more frequently if you are over 34 years old. Skin check. Lung cancer screening. You may have this screening every year starting at age 50 if you have a 30-pack-year history of smoking and currently smoke or have quit within the past 15 years. Fecal occult blood test (FOBT) of the stool. You may have this test every year starting at age 28. Flexible sigmoidoscopy or colonoscopy. You may have a sigmoidoscopy every 5 years or a colonoscopy every 10 years starting at age 58. Prostate cancer screening. Recommendations will vary depending on your family history and other risks. Hepatitis C blood test. Hepatitis B blood test. Sexually transmitted disease (STD) testing. Diabetes screening. This is done by checking your blood sugar (glucose) after you have not eaten for a while (fasting). You may have this done every 1-3 years. Abdominal aortic aneurysm (AAA) screening. You may need this if you are a current or former smoker. Osteoporosis. You may be screened starting at age 74 if you are at high risk. Talk with your health care provider about your test results, treatment options, and if necessary, the need for more tests. Vaccines  Your health care provider may recommend certain vaccines, such as: Influenza vaccine. This is recommended every year. Tetanus, diphtheria, and acellular pertussis (Tdap, Td) vaccine. You may need a Td booster every 10 years. Zoster vaccine. You may need this after  age 45. Pneumococcal 13-valent conjugate (PCV13) vaccine. One dose is recommended after age  69. Pneumococcal polysaccharide (PPSV23) vaccine. One dose is recommended after age 47. Talk to your health care provider about which screenings and vaccines you need and how often you need them. This information is not intended to replace advice given to you by your health care provider. Make sure you discuss any questions you have with your health care provider. Document Released: 01/18/2015 Document Revised: 09/11/2015 Document Reviewed: 10/23/2014 Elsevier Interactive Patient Education  2017 [employer] Prevention in the Home Falls can cause injuries. They can happen to people of all ages. There are many things you can do to make your home safe and to help prevent falls. What can I do on the outside of my home? Regularly fix the edges of walkways and driveways and fix any cracks. Remove anything that might make you trip as you walk through a door, such as a raised step or threshold. Trim any bushes or trees on the path to your home. Use bright outdoor lighting. Clear any walking paths of anything that might make someone trip, such as rocks or tools. Regularly check to see if handrails are loose or broken. Make sure that both sides of any steps have handrails. Any raised decks and porches should have guardrails on the edges. Have any leaves, snow, or ice cleared regularly. Use sand or salt on walking paths during winter. Clean up any spills in your garage right away. This includes oil or grease spills. What can I do in the bathroom? Use night lights. Install grab bars by the toilet and in the tub and shower. Do not use towel bars as grab bars. Use non-skid mats or decals in the tub or shower. If you need to sit down in the shower, use a plastic, non-slip stool. Keep the floor dry. Clean up any water that spills on the floor as soon as it happens. Remove soap buildup in the tub or shower regularly. Attach bath mats securely with double-sided non-slip rug tape. Do not have throw  rugs and other things on the floor that can make you trip. What can I do in the bedroom? Use night lights. Make sure that you have a light by your bed that is easy to reach. Do not use any sheets or blankets that are too big for your bed. They should not hang down onto the floor. Have a firm chair that has side arms. You can use this for support while you get dressed. Do not have throw rugs and other things on the floor that can make you trip. What can I do in the kitchen? Clean up any spills right away. Avoid walking on wet floors. Keep items that you use a lot in easy-to-reach places. If you need to reach something above you, use a strong step stool that has a grab bar. Keep electrical cords out of the way. Do not use floor polish or wax that makes floors slippery. If you must use wax, use non-skid floor wax. Do not have throw rugs and other things on the floor that can make you trip. What can I do with my stairs? Do not leave any items on the stairs. Make sure that there are handrails on both sides of the stairs and use them. Fix handrails that are broken or loose. Make sure that handrails are as long as the stairways. Check any carpeting to make sure that it is firmly attached to the stairs.  Fix any carpet that is loose or worn. Avoid having throw rugs at the top or bottom of the stairs. If you do have throw rugs, attach them to the floor with carpet tape. Make sure that you have a light switch at the top of the stairs and the bottom of the stairs. If you do not have them, ask someone to add them for you. What else can I do to help prevent falls? Wear shoes that: Do not have high heels. Have rubber bottoms. Are comfortable and fit you well. Are closed at the toe. Do not wear sandals. If you use a stepladder: Make sure that it is fully opened. Do not climb a closed stepladder. Make sure that both sides of the stepladder are locked into place. Ask someone to hold it for you, if  possible. Clearly mark and make sure that you can see: Any grab bars or handrails. First and last steps. Where the edge of each step is. Use tools that help you move around (mobility aids) if they are needed. These include: Canes. Walkers. Scooters. Crutches. Turn on the lights when you go into a dark area. Replace any light bulbs as soon as they burn out. Set up your furniture so you have a clear path. Avoid moving your furniture around. If any of your floors are uneven, fix them. If there are any pets around you, be aware of where they are. Review your medicines with your doctor. Some medicines can make you feel dizzy. This can increase your chance of falling. Ask your doctor what other things that you can do to help prevent falls. This information is not intended to replace advice given to you by your health care provider. Make sure you discuss any questions you have with your health care provider. Document Released: 10/18/2008 Document Revised: 05/30/2015 Document Reviewed: 01/26/2014 Elsevier Interactive Patient Education  2017 [employer].

## 2021-01-29 ENCOUNTER — Encounter: Payer: Self-pay | Admitting: Family Medicine

## 2021-01-29 ENCOUNTER — Other Ambulatory Visit: Payer: Self-pay

## 2021-01-29 ENCOUNTER — Ambulatory Visit (INDEPENDENT_AMBULATORY_CARE_PROVIDER_SITE_OTHER): Payer: Medicare Other | Admitting: Family Medicine

## 2021-01-29 VITALS — BP 134/86 | HR 80 | Temp 97.4°F | Ht 66.5 in | Wt 211.0 lb

## 2021-01-29 DIAGNOSIS — E118 Type 2 diabetes mellitus with unspecified complications: Secondary | ICD-10-CM

## 2021-01-29 DIAGNOSIS — F418 Other specified anxiety disorders: Secondary | ICD-10-CM

## 2021-01-29 DIAGNOSIS — I4891 Unspecified atrial fibrillation: Secondary | ICD-10-CM

## 2021-01-29 DIAGNOSIS — E669 Obesity, unspecified: Secondary | ICD-10-CM

## 2021-01-29 DIAGNOSIS — I10 Essential (primary) hypertension: Secondary | ICD-10-CM | POA: Diagnosis not present

## 2021-01-29 DIAGNOSIS — N201 Calculus of ureter: Secondary | ICD-10-CM

## 2021-01-29 DIAGNOSIS — E785 Hyperlipidemia, unspecified: Secondary | ICD-10-CM

## 2021-01-29 DIAGNOSIS — E1169 Type 2 diabetes mellitus with other specified complication: Secondary | ICD-10-CM | POA: Diagnosis not present

## 2021-01-29 DIAGNOSIS — I2510 Atherosclerotic heart disease of native coronary artery without angina pectoris: Secondary | ICD-10-CM

## 2021-01-29 DIAGNOSIS — Z0000 Encounter for general adult medical examination without abnormal findings: Secondary | ICD-10-CM | POA: Diagnosis not present

## 2021-01-29 DIAGNOSIS — N400 Enlarged prostate without lower urinary tract symptoms: Secondary | ICD-10-CM

## 2021-01-29 DIAGNOSIS — E66811 Obesity, class 1: Secondary | ICD-10-CM

## 2021-01-29 MED ORDER — ROSUVASTATIN CALCIUM 10 MG PO TABS: 10.0000 mg | ORAL_TABLET | Freq: Every day | ORAL | 3 refills | Status: DC

## 2021-01-29 MED ORDER — METFORMIN HCL 500 MG PO TABS: 500.0000 mg | ORAL_TABLET | Freq: Every day | ORAL | 3 refills | Status: DC

## 2021-01-29 MED ORDER — SILODOSIN 8 MG PO CAPS: 8.0000 mg | ORAL_CAPSULE | Freq: Every day | ORAL | 3 refills | Status: DC

## 2021-01-29 MED ORDER — EZETIMIBE 10 MG PO TABS: 10.0000 mg | ORAL_TABLET | Freq: Every day | ORAL | 3 refills | Status: DC

## 2021-01-29 MED ORDER — METOPROLOL SUCCINATE ER 50 MG PO TB24: 75.0000 mg | ORAL_TABLET | Freq: Two times a day (BID) | ORAL | 1 refills | Status: DC

## 2021-01-29 NOTE — Progress Notes (Addendum)
Patient ID: Travis Howell, male    DOB: [birth year], 71 y.o.   MRN: [id number]  This visit was conducted in person.  BP 134/86    Pulse 80    Temp (!) 97.4 F (36.3 C) (Temporal)    Ht 5' 6.5" (1.689 m)    Wt 211 lb (95.7 kg)    SpO2 95%    BMI 33.55 kg/m    CC: CPE Subjective:   HPI: Travis Howell is a 71 y.o. male presenting on 01/29/2021 for Annual Exam (MCR prt 2. )   Saw health advisor last week for medicare wellness visit. Note reviewed.   No results found.  Flowsheet Row Clinical Support from 01/23/2021 in [facility]  PHQ-2 Total Score 0       Fall Risk  01/23/2021 01/23/2020 10/19/2018 11/24/2017 10/11/2017  Falls in the past year? 0 0 0 0 No  Comment - - - Emmi Telephone Survey: data to providers prior to load -  Number falls in past yr: 0 0 - - -  Injury with Fall? 0 0 - - -  Comment - - - - -  Risk for fall due to : No Fall Risks No Fall Risks Medication side effect - -  Follow up Falls prevention discussed Falls evaluation completed;Falls prevention discussed Falls evaluation completed;Falls prevention discussed - -    Recent right kidney stone s/p extraction with stent placement through urology ([facility]).  Persistent atrial fibrillation - followed by EP on eliquis, toprol XL 75mg  daily. Discussing ablation - had to reschedule due to illness.  Known CAD s/p DES to LAD 04-Apr-2010.   DM - on metformin 500mg  daily.   Has 2 great grandchildren that stay with them (5yo and 8yo).  2 siblings passed away April 04, 2019 from stroke and heart attack.   No recent gout flares - has not used colchicine recently. H/o podagra.   Preventative: COLONOSCOPY 11/2017 - 1 TA, diverticulosis, rpt 5 yrs ([facility]) Prostate cancer screening - on rapaflo for BPH. voids well. Nocturia x1. No urinary symptoms.  Lung cancer screening - quit smoking Apr 03, 2009, not eligible due to low PY hx.  Flu shot yearly  [place] 02/2019 x2, booster 10/2019, 05/2020  Prevnar-13  April 03, 2017, pneumovax 10/2018 Tdap 03-Apr-2016 zostavax - 04/03/12 Shingrix - 07/2020, 10/2020 Advanced directives received, reviewed 10/2018. HCPOA are Merleen Nicely then Charleston Poot (deceased sister). Ok with life support if able to recover within 3-6 months.  Seat belt use discussed.  Sunscreen use discussed. No changing moles on skin.  Ex smoker - quit 03-Apr-2009. <20 PY hx. Wife smokes indoors, cutting down.  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 Interim medical history since our last visit reviewed. Allergies and medications reviewed and updated. Outpatient Medications Prior to Visit  Medication Sig Dispense Refill   albuterol (VENTOLIN HFA) 108 (90 Base) MCG/ACT inhaler USE 2 INHALATIONS BY MOUTH  EVERY 6 HOURS AS NEEDED FOR WHEEZING OR SHORTNESS OF  BREATH (Patient taking differently: Inhale 2 puffs into the lungs every 6 (six) hours as needed for wheezing or shortness of breath.) 72 g  0   apixaban (ELIQUIS) 5 MG TABS tablet Take 1 tablet (5 mg total) by mouth 2 (two) times daily. 180 tablet 1   aspirin 81 MG chewable tablet Chew 81 mg by mouth daily.      colchicine 0.6 MG tablet TAKE 1 TABLET BY MOUTH  DAILY AS NEEDED FOR GOUT  FLARE (Patient taking differently: Take 0.6 mg by mouth daily as needed (gout).) 60 tablet 1   Omega-3 Fatty Acids (FISH OIL) 1200 MG CAPS Take 2,400 mg by mouth in the morning and at bedtime.     Potassium 99 MG TABS Take 1-2 tablets (99-198 mg total) by mouth daily. (Patient taking differently: Take 99 mg by mouth daily.)     ezetimibe (ZETIA) 10 MG tablet Take 1 tablet (10 mg total) by mouth daily. 90  tablet 3   metFORMIN (GLUCOPHAGE) 500 MG tablet TAKE 1 TABLET BY MOUTH EVERY DAY WITH BREAKFAST (Patient taking differently: Take 500 mg by mouth daily with breakfast.) 90 tablet 3   metoprolol succinate (TOPROL-XL) 50 MG 24 hr tablet Take 1.5 tablets (75 mg total) by mouth 2 (two) times daily. Take with or immediately following a meal. 21 tablet 0   rosuvastatin (CRESTOR) 5 MG tablet Take 1 tablet (5 mg total) by mouth daily. 90 tablet 3   silodosin (RAPAFLO) 8 MG CAPS capsule Take 1 capsule (8 mg total) by mouth daily with breakfast. (Patient taking differently: Take 8 mg by mouth daily.) 90 capsule 3   No facility-administered medications prior to visit.     Per HPI unless specifically indicated in ROS section below Review of Systems  Constitutional:  Negative for activity change, appetite change, chills, fatigue, fever and unexpected weight change.  HENT:  Negative for hearing loss.   Eyes:  Negative for visual disturbance.  Respiratory:  Negative for cough, chest tightness, shortness of breath and wheezing.   Cardiovascular:  Negative for chest pain, palpitations and leg swelling.  Gastrointestinal:  Negative for abdominal distention, abdominal pain, blood in stool, constipation, diarrhea, nausea and vomiting.  Genitourinary:  Negative for difficulty urinating and hematuria.  Musculoskeletal:  Negative for arthralgias, myalgias and neck pain.  Skin:  Negative for rash.  Neurological:  Negative for dizziness, seizures, syncope and headaches.  Hematological:  Negative for adenopathy. Does not bruise/bleed easily.  Psychiatric/Behavioral:  Negative for dysphoric mood. The patient is not nervous/anxious.    Objective:  BP 134/86    Pulse 80    Temp (!) 97.4 F (36.3 C) (Temporal)    Ht 5' 6.5" (1.689 m)    Wt 211 lb (95.7 kg)    SpO2 95%    BMI 33.55 kg/m   Wt Readings from Last 3 Encounters:  01/29/21 211 lb (95.7 kg)  01/23/21 205 lb (93 kg)  11/14/20 201 lb 12.8 oz (91.5 kg)       Physical Exam Vitals and nursing note reviewed.  Constitutional:      Howell: He is not in acute distress.    Appearance: Normal appearance. He is well-developed. He is not ill-appearing.  HENT:     Head: Normocephalic and atraumatic.     Right Ear: Hearing, tympanic membrane, ear canal and external ear normal.     Left Ear: Hearing, tympanic membrane, ear canal and external ear normal.  Eyes:     Howell: No scleral icterus.    Extraocular Movements: Extraocular movements intact.     Conjunctiva/sclera: Conjunctivae normal.     Pupils: Pupils are equal, round,  and reactive to light.  Neck:     Thyroid: No thyroid mass or thyromegaly.     Vascular: No carotid bruit.  Cardiovascular:     Rate and Rhythm: Normal rate. Rhythm irregularly irregular.     Pulses: Normal pulses.          Radial pulses are 2+ on the right side and 2+ on the left side.     Heart sounds: Normal heart sounds. No murmur heard. Pulmonary:     Effort: Pulmonary effort is normal. No respiratory distress.     Breath sounds: Normal breath sounds. No wheezing, rhonchi or rales.  Abdominal:     Howell: Bowel sounds are normal. There is no distension.     Palpations: Abdomen is soft. There is no mass.     Tenderness: There is no abdominal tenderness. There is no guarding or rebound.     Hernia: No hernia is present.  Musculoskeletal:        Howell: Normal range of motion.     Cervical back: Normal range of motion and neck supple.     Right lower leg: No edema.     Left lower leg: No edema.  Lymphadenopathy:     Cervical: No cervical adenopathy.  Skin:    Howell: Skin is warm and dry.     Findings: No rash.  Neurological:     Howell: No focal deficit present.     Mental Status: He is alert and oriented to person, place, and time.  Psychiatric:        Mood and Affect: Mood normal.        Behavior: Behavior normal.        Thought Content: Thought content normal.        Judgment: Judgment normal.       Results for orders placed or performed in visit on 01/22/21  Microalbumin / creatinine urine ratio  Result Value Ref Range   Microalb, Ur 3.1 (H) 0.0 - 1.9 mg/dL   Creatinine,U 136.2 mg/dL   Microalb Creat Ratio 2.3 0.0 - 30.0 mg/g  PSA  Result Value Ref Range   PSA 0.21 0.10 - 4.00 ng/mL  Hemoglobin A1c  Result Value Ref Range   Hgb A1c MFr Bld 6.7 (H) 4.6 - 6.5 %  Comprehensive metabolic panel  Result Value Ref Range   Sodium 141 135 - 145 mEq/L   Potassium 4.2 3.5 - 5.1 mEq/L   Chloride 103 96 - 112 mEq/L   CO2 29 19 - 32 mEq/L   Glucose, Bld 132 (H) 70 - 99 mg/dL   BUN 15 6 - 23 mg/dL   Creatinine, Ser 0.79 0.40 - 1.50 mg/dL   Total Bilirubin 0.4 0.2 - 1.2 mg/dL   Alkaline Phosphatase 56 39 - 117 U/L   AST 21 0 - 37 U/L   ALT 16 0 - 53 U/L   Total Protein 6.8 6.0 - 8.3 g/dL   Albumin 4.0 3.5 - 5.2 g/dL   GFR 90.76 >60.00 mL/min   Calcium 8.7 8.4 - 10.5 mg/dL  Lipid panel  Result Value Ref Range   Cholesterol 124 0 - 200 mg/dL   Triglycerides 78.0 0.0 - 149.0 mg/dL   HDL 30.70 (L) >39.00 mg/dL   VLDL 15.6 0.0 - 40.0 mg/dL   LDL Cholesterol 78 0 - 99 mg/dL   Total CHOL/HDL Ratio 4    NonHDL 93.37    Lab Results  Component Value Date   LABURIC 6.6 07/29/2020  Assessment & Plan:  This visit occurred during the SARS-CoV-2 public health emergency.  Safety protocols were in place, including screening questions prior to the visit, additional usage of staff PPE, and extensive cleaning of exam room while observing appropriate contact time as indicated for disinfecting solutions.   Problem List Items Addressed This Visit     Health maintenance examination - Primary (Chronic)    Preventative protocols reviewed and updated unless pt declined. Discussed healthy diet and lifestyle.       Depression with anxiety    Off prozac for the past year and doing well.       CAD (coronary artery disease)    Appreciate cardiology care.       Relevant Medications   ezetimibe  (ZETIA) 10 MG tablet   rosuvastatin (CRESTOR) 10 MG tablet   metoprolol succinate (TOPROL-XL) 50 MG 24 hr tablet   HTN (hypertension)    Chronic, stable on current regimen - continue.       Relevant Medications   ezetimibe (ZETIA) 10 MG tablet   rosuvastatin (CRESTOR) 10 MG tablet   metoprolol succinate (TOPROL-XL) 50 MG 24 hr tablet   Hyperlipidemia associated with type 2 diabetes mellitus (HCC)    Chronic on statin and zetia. Discussed diet choices to improve LDL control. LDL above goal - will increase crestor to 10mg  daily.  The ASCVD Risk score (Arnett DK, et al., 2019) failed to calculate for the following reasons:   The patient has a prior MI or stroke diagnosis       Relevant Medications   ezetimibe (ZETIA) 10 MG tablet   metFORMIN (GLUCOPHAGE) 500 MG tablet   rosuvastatin (CRESTOR) 10 MG tablet   metoprolol succinate (TOPROL-XL) 50 MG 24 hr tablet   BPH (benign prostatic hyperplasia)    Stable period on rapaflo - continue.       Relevant Medications   silodosin (RAPAFLO) 8 MG CAPS capsule   Obesity, Class I, BMI 30-34.9    Weight gain noted. Encouraged healthy diet and lifestyle choices to affect sustainable weight loss.       Controlled diabetes mellitus type 2 with complications (HCC)    Chronic, overall stable on low dose metformin. RTC 6 mo DM f/u visit       Relevant Medications   metFORMIN (GLUCOPHAGE) 500 MG tablet   rosuvastatin (CRESTOR) 10 MG tablet   Atrial fibrillation with RVR (HCC)    This persists.  Continues eliquis and toprol XL 75mg  BID.  Planning to reschedule ablation.       Relevant Medications   ezetimibe (ZETIA) 10 MG tablet   rosuvastatin (CRESTOR) 10 MG tablet   metoprolol succinate (TOPROL-XL) 50 MG 24 hr tablet   Right ureteral stone    S/p stone removal and stent placement then removal. Appreciate urology care.         Meds ordered this encounter  Medications   ezetimibe (ZETIA) 10 MG tablet    Sig: Take 1 tablet (10 mg  total) by mouth daily.    Dispense:  90 tablet    Refill:  3   metFORMIN (GLUCOPHAGE) 500 MG tablet    Sig: Take 1 tablet (500 mg total) by mouth daily with breakfast.    Dispense:  90 tablet    Refill:  3   rosuvastatin (CRESTOR) 10 MG tablet    Sig: Take 1 tablet (10 mg total) by mouth daily.    Dispense:  90 tablet    Refill:  3  silodosin (RAPAFLO) 8 MG CAPS capsule    Sig: Take 1 capsule (8 mg total) by mouth daily with breakfast.    Dispense:  90 capsule    Refill:  3   metoprolol succinate (TOPROL-XL) 50 MG 24 hr tablet    Sig: Take 1.5 tablets (75 mg total) by mouth 2 (two) times daily. Take with or immediately following a meal.    Dispense:  270 tablet    Refill:  1   No orders of the defined types were placed in this encounter.   Patient instructions: Increase crestor (rosuvastatin) to 10mg  daily, let us know if any trouble tolerating higher does. Continue ezetimibe (Zetia) 10mg  daily.  You are doing well today Return as needed or in 6 months for diabetes follow up visit.   Follow up plan: Return in about 6 months (around 07/29/2021) for follow up visit.  [name], MD

## 2021-01-29 NOTE — Assessment & Plan Note (Signed)
Off prozac for the past year and doing well.

## 2021-01-29 NOTE — Assessment & Plan Note (Addendum)
S/p stone removal and stent placement then removal. Appreciate urology care.

## 2021-01-29 NOTE — Assessment & Plan Note (Signed)
Weight gain noted. Encouraged healthy diet and lifestyle choices to affect sustainable weight loss.  

## 2021-01-29 NOTE — Assessment & Plan Note (Signed)
Appreciate cardiology care.  °

## 2021-01-29 NOTE — Assessment & Plan Note (Signed)
Stable period on rapaflo - continue.

## 2021-01-29 NOTE — Assessment & Plan Note (Signed)
Chronic on statin and zetia. Discussed diet choices to improve LDL control. LDL above goal - will increase crestor to 10mg  daily.  The ASCVD Risk score (Arnett DK, et al., 2019) failed to calculate for the following reasons:   The patient has a prior MI or stroke diagnosis

## 2021-01-29 NOTE — Assessment & Plan Note (Signed)
This persists.  Continues eliquis and toprol XL 75mg  BID.  Planning to reschedule ablation.

## 2021-01-29 NOTE — Assessment & Plan Note (Signed)
Preventative protocols reviewed and updated unless pt declined. Discussed healthy diet and lifestyle.  

## 2021-01-29 NOTE — Assessment & Plan Note (Signed)
Chronic, stable on current regimen - continue. 

## 2021-01-29 NOTE — Patient Instructions (Addendum)
Increase crestor (rosuvastatin) to 10mg  daily, let us know if any trouble tolerating higher does. Continue ezetimibe (Zetia) 10mg  daily.  You are doing well today Return as needed or in 6 months for diabetes follow up visit.   Health Maintenance After Age 70 After age 77, you are at a higher risk for certain long-term diseases and infections as well as injuries from falls. Falls are a major cause of broken bones and head injuries in people who are older than age 70. Getting regular preventive care can help to keep you healthy and well. Preventive care includes getting regular testing and making lifestyle changes as recommended by your health care provider. Talk with your health care provider about: Which screenings and tests you should have. A screening is a test that checks for a disease when you have no symptoms. A diet and exercise plan that is right for you. What should I know about screenings and tests to prevent falls? Screening and testing are the best ways to find a health problem early. Early diagnosis and treatment give you the best chance of managing medical conditions that are common after age 70. Certain conditions and lifestyle choices may make you more likely to have a fall. Your health care provider may recommend: Regular vision checks. Poor vision and conditions such as cataracts can make you more likely to have a fall. If you wear glasses, make sure to get your prescription updated if your vision changes. Medicine review. Work with your health care provider to regularly review all of the medicines you are taking, including over-the-counter medicines. Ask your health care provider about any side effects that may make you more likely to have a fall. Tell your health care provider if any medicines that you take make you feel dizzy or sleepy. Strength and balance checks. Your health care provider may recommend certain tests to check your strength and balance while standing, walking, or  changing positions. Foot health exam. Foot pain and numbness, as well as not wearing proper footwear, can make you more likely to have a fall. Screenings, including: Osteoporosis screening. Osteoporosis is a condition that causes the bones to get weaker and break more easily. Blood pressure screening. Blood pressure changes and medicines to control blood pressure can make you feel dizzy. Depression screening. You may be more likely to have a fall if you have a fear of falling, feel depressed, or feel unable to do activities that you used to do. Alcohol use screening. Using too much alcohol can affect your balance and may make you more likely to have a fall. Follow these instructions at home: Lifestyle Do not drink alcohol if: Your health care provider tells you not to drink. If you drink alcohol: Limit how much you have to: 0-1 drink a day for women. 0-2 drinks a day for men. Know how much alcohol is in your drink. In the [place] one drink equals one 12 oz bottle of beer (355 mL), one 5 oz glass of wine (148 mL), or one 1 oz glass of hard liquor (44 mL). Do not use any products that contain nicotine or tobacco. These products include cigarettes, chewing tobacco, and vaping devices, such as e-cigarettes. If you need help quitting, ask your health care provider. Activity  Follow a regular exercise program to stay fit. This will help you maintain your balance. Ask your health care provider what types of exercise are appropriate for you. If you need a cane or walker, use it as recommended by your  health care provider. Wear supportive shoes that have nonskid soles. Safety  Remove any tripping hazards, such as rugs, cords, and clutter. Install safety equipment such as grab bars in bathrooms and safety rails on stairs. Keep rooms and walkways well-lit. General instructions Talk with your health care provider about your risks for falling. Tell your health care provider if: You fall. Be sure to  tell your health care provider about all falls, even ones that seem minor. You feel dizzy, tiredness (fatigue), or off-balance. Take over-the-counter and prescription medicines only as told by your health care provider. These include supplements. Eat a healthy diet and maintain a healthy weight. A healthy diet includes low-fat dairy products, low-fat (lean) meats, and fiber from whole grains, beans, and lots of fruits and vegetables. Stay current with your vaccines. Schedule regular health, dental, and eye exams. Summary Having a healthy lifestyle and getting preventive care can help to protect your health and wellness after age 70. Screening and testing are the best way to find a health problem early and help you avoid having a fall. Early diagnosis and treatment give you the best chance for managing medical conditions that are more common for people who are older than age 70. Falls are a major cause of broken bones and head injuries in people who are older than age 70. Take precautions to prevent a fall at home. Work with your health care provider to learn what changes you can make to improve your health and wellness and to prevent falls. This information is not intended to replace advice given to you by your health care provider. Make sure you discuss any questions you have with your health care provider. Document Revised: 05/13/2020 Document Reviewed: 05/13/2020 Elsevier Patient Education  [place].

## 2021-01-29 NOTE — Assessment & Plan Note (Signed)
Chronic, overall stable on low dose metformin. RTC 6 mo DM f/u visit

## 2021-02-04 ENCOUNTER — Telehealth: Payer: Self-pay | Admitting: Cardiology

## 2021-02-04 NOTE — Telephone Encounter (Signed)
°  Per MyChart scheduling message:  When can you schedule for the Ablasian for the afib

## 2021-02-05 NOTE — Telephone Encounter (Signed)
Late Entry:  Spoke with pt yesterday. Rescheduled ablation date held for 4/19. Aware I will be in touch w/ further instructions. Patient verbalized understanding and agreeable to plan.

## 2021-02-14 ENCOUNTER — Ambulatory Visit (HOSPITAL_COMMUNITY): Payer: Medicare Other | Admitting: Physician Assistant

## 2021-04-08 ENCOUNTER — Telehealth: Payer: Self-pay | Admitting: *Deleted

## 2021-04-08 ENCOUNTER — Encounter: Payer: Self-pay | Admitting: *Deleted

## 2021-04-08 DIAGNOSIS — I4819 Other persistent atrial fibrillation: Secondary | ICD-10-CM

## 2021-04-08 DIAGNOSIS — Z01812 Encounter for preprocedural laboratory examination: Secondary | ICD-10-CM

## 2021-04-08 NOTE — Telephone Encounter (Signed)
Reviewed upcoming CT & ablation instructions.  ?Sending via mychart. ?Pt scheduled for labs 4/6. ?Scheduled to see Camnitz for H&P 4/17 (last seen 10/28/20). ?Aware office will call to arrange post procedure follow up. ?Advised to reach out if questions or need to re-review information. ?Patient verbalized understanding and agreeable to plan.  ? ?

## 2021-04-10 ENCOUNTER — Other Ambulatory Visit: Payer: Medicare Other

## 2021-04-10 DIAGNOSIS — I4819 Other persistent atrial fibrillation: Secondary | ICD-10-CM

## 2021-04-10 DIAGNOSIS — Z01812 Encounter for preprocedural laboratory examination: Secondary | ICD-10-CM

## 2021-04-10 LAB — CBC
Hematocrit: 39.6 % (ref 37.5–51.0)
Hemoglobin: 13.6 g/dL (ref 13.0–17.7)
MCH: 32.9 pg (ref 26.6–33.0)
MCHC: 34.3 g/dL (ref 31.5–35.7)
MCV: 96 fL (ref 79–97)
Platelets: 197 10*3/uL (ref 150–450)
RBC: 4.13 x10E6/uL — ABNORMAL LOW (ref 4.14–5.80)
RDW: 14 % (ref 11.6–15.4)
WBC: 5.9 10*3/uL (ref 3.4–10.8)

## 2021-04-10 LAB — BASIC METABOLIC PANEL
BUN/Creatinine Ratio: 18 (ref 10–24)
BUN: 14 mg/dL (ref 8–27)
CO2: 31 mmol/L — ABNORMAL HIGH (ref 20–29)
Calcium: 9.9 mg/dL (ref 8.6–10.2)
Chloride: 104 mmol/L (ref 96–106)
Creatinine, Ser: 0.8 mg/dL (ref 0.76–1.27)
Glucose: 188 mg/dL — ABNORMAL HIGH (ref 70–99)
Potassium: 4.3 mmol/L (ref 3.5–5.2)
Sodium: 142 mmol/L (ref 134–144)
eGFR: 96 mL/min/{1.73_m2} (ref >59)

## 2021-04-16 ENCOUNTER — Telehealth (HOSPITAL_COMMUNITY): Payer: Self-pay | Admitting: Emergency Medicine

## 2021-04-16 ENCOUNTER — Ambulatory Visit: Payer: Medicare Other | Admitting: Cardiology

## 2021-04-16 NOTE — Telephone Encounter (Signed)
Attempted to call patient regarding upcoming cardiac CT appointment. °Left message on voicemail with name and callback number °Nakya Weyand RN Navigator Cardiac Imaging °[facility] °[phone] Office °[phone] Cell ° °

## 2021-04-17 ENCOUNTER — Ambulatory Visit (HOSPITAL_COMMUNITY)
Admission: RE | Admit: 2021-04-17 | Discharge: 2021-04-17 | Disposition: A | Discharge status: Home / Self Care | Payer: Medicare Other | Source: Ambulatory Visit | Attending: Cardiology | Admitting: Cardiology

## 2021-04-17 DIAGNOSIS — I4819 Other persistent atrial fibrillation: Secondary | ICD-10-CM

## 2021-04-17 MED ORDER — METOPROLOL TARTRATE 5 MG/5ML IV SOLN
5.0000 mg | INTRAVENOUS | Status: DC | PRN
Start: 2021-04-17 — End: 2021-04-18
  Administered 2021-04-17: 5 mg via INTRAVENOUS

## 2021-04-17 MED ORDER — DILTIAZEM HCL 25 MG/5ML IV SOLN
INTRAVENOUS | Status: DC
Start: 2021-04-17 — End: 2021-04-18
  Filled 2021-04-17: qty 5

## 2021-04-17 MED ORDER — METOPROLOL TARTRATE 5 MG/5ML IV SOLN
INTRAVENOUS | Status: DC
Start: 2021-04-17 — End: 2021-04-18
  Filled 2021-04-17: qty 20

## 2021-04-17 MED ORDER — DILTIAZEM HCL 25 MG/5ML IV SOLN: 10.0000 mg | Freq: Once | INTRAVENOUS | Status: DC

## 2021-04-17 MED ORDER — IOHEXOL 350 MG/ML SOLN
100.0000 mL | Freq: Once | INTRAVENOUS | Status: AC | PRN
  Administered 2021-04-17: 100 mL via INTRAVENOUS

## 2021-04-21 ENCOUNTER — Ambulatory Visit: Payer: Medicare Other | Admitting: Cardiology

## 2021-04-21 ENCOUNTER — Encounter: Payer: Self-pay | Admitting: Cardiology

## 2021-04-21 VITALS — BP 130/84 | HR 76 | Ht 67.0 in | Wt 217.0 lb

## 2021-04-21 DIAGNOSIS — D6869 Other thrombophilia: Secondary | ICD-10-CM

## 2021-04-21 DIAGNOSIS — I4819 Other persistent atrial fibrillation: Secondary | ICD-10-CM | POA: Diagnosis not present

## 2021-04-21 NOTE — Patient Instructions (Addendum)
Medication Instructions:  ?Your physician recommends that you continue on your current medications as directed. Please refer to the Current Medication list given to you today. ? ?*If you need a refill on your cardiac medications before your next appointment, please call your pharmacy* ? ? ?Lab Work: ?None ordered ? ? ?Testing/Procedures: ?None ordered ? ? ?Follow-Up: ?At [facility], you and your health needs are our priority.  As part of our continuing mission to provide you with exceptional heart care, we have created designated Provider Care Teams.  These Care Teams include your primary Cardiologist (physician) and Advanced Practice Providers (APPs -  Physician Assistants and Nurse Practitioners) who all work together to provide you with the care you need, when you need it. ? ?Your next appointment:   ?Keep scheduled post ablation follow up appointments  ? ?The format for your next appointment:   ?In Person ? ?Provider:   ?Allegra Lai, MD ? ? ? ?Thank you for choosing [facility]!! ? ? ?Trinidad Curet, RN ?[phone] ? ?
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
## 2021-04-22 NOTE — Anesthesia Preprocedure Evaluation (Addendum)
Anesthesia Evaluation  ?Patient identified by MRN, date of birth, ID band ?Patient awake ? ? ? ?Reviewed: ?Allergy & Precautions, NPO status , Patient's Chart, lab work & pertinent test results, reviewed documented beta blocker date and time  ? ?History of Anesthesia Complications ?Negative for: history of anesthetic complications ? ?Airway ?Mallampati: III ? ?TM Distance: >3 FB ?Neck ROM: Full ? ? ? Dental ? ?(+) Dental Advisory Given, Teeth Intact ?  ?Pulmonary ?former smoker,  ?  ?Pulmonary exam normal ? ? ? ? ? ? ? Cardiovascular ?hypertension, Pt. on home beta blockers and Pt. on medications ?+ CAD, + Past MI and + Cardiac Stents  ?Normal cardiovascular exam+ dysrhythmias Atrial Fibrillation  ? ? ?'22 TTE - EF 50 to 55%. There is mildly elevated pulmonary artery systolic pressure. Right and left atrial sizes were moderately dilated. Trivial MR. ? ?  ?Neuro/Psych ?PSYCHIATRIC DISORDERS Anxiety Depression  Neuromuscular disease (RLS)   ? GI/Hepatic ?negative GI ROS, Neg liver ROS,   ?Endo/Other  ? ?Obesity ?Pre-DM ? ? Renal/GU ?negative Renal ROS  ? ?  ?Musculoskeletal ? ?(+) Arthritis ,  ? Abdominal ?  ?Peds ? Hematology ? ?On eliquis ?   ?Anesthesia Other Findings ? ? Reproductive/Obstetrics ? ?  ? ? ? ? ? ? ? ? ? ? ? ? ? ?  ?  ? ? ? ? ? ? ? ?Anesthesia Physical ?Anesthesia Plan ? ?ASA: 3 ? ?Anesthesia Plan: General  ? ?Post-op Pain Management: Tylenol PO (pre-op)*  ? ?Induction: Intravenous ? ?PONV Risk Score and Plan: 2 and Treatment may vary due to age or medical condition, Ondansetron and Dexamethasone ? ?Airway Management Planned: Oral ETT ? ?Additional Equipment: None ? ?Intra-op Plan:  ? ?Post-operative Plan: Extubation in OR ? ?Informed Consent: I have reviewed the patients History and Physical, chart, labs and discussed the procedure including the risks, benefits and alternatives for the proposed anesthesia with the patient or authorized representative who has  indicated his/her understanding and acceptance.  ? ? ? ?Dental advisory given ? ?Plan Discussed with: CRNA and Anesthesiologist ? ?Anesthesia Plan Comments:   ? ? ? ? ? ?Anesthesia Quick Evaluation ? ?

## 2021-04-22 NOTE — Pre-Procedure Instructions (Signed)
Attempted to call patient regarding procedure instructions.  Left voice mail on the following items: ?Arrival time 0630  ?Nothing to eat or drink after midnight ?No meds AM of procedure ?Responsible person to drive you home and stay with you for 24 hrs ? ?Have you missed any doses of anti-coagulant Left message to be sure to take both doses of eliquis today and none in the morning. ?   ?

## 2021-04-23 ENCOUNTER — Ambulatory Visit (HOSPITAL_COMMUNITY)
Admission: RE | Admit: 2021-04-23 | Discharge: 2021-04-23 | Disposition: A | Discharge status: Home / Self Care | Payer: Medicare Other | Attending: Cardiology | Admitting: Cardiology

## 2021-04-23 ENCOUNTER — Encounter (HOSPITAL_COMMUNITY): Payer: Self-pay | Admitting: Cardiology

## 2021-04-23 ENCOUNTER — Ambulatory Visit (HOSPITAL_COMMUNITY): Payer: Medicare Other | Admitting: Anesthesiology

## 2021-04-23 ENCOUNTER — Encounter (HOSPITAL_COMMUNITY)
Admission: RE | Disposition: A | Discharge status: Home / Self Care | Payer: Self-pay | Source: Home / Self Care | Attending: Cardiology

## 2021-04-23 ENCOUNTER — Ambulatory Visit (HOSPITAL_BASED_OUTPATIENT_CLINIC_OR_DEPARTMENT_OTHER): Payer: Medicare Other | Admitting: Anesthesiology

## 2021-04-23 ENCOUNTER — Other Ambulatory Visit: Payer: Self-pay

## 2021-04-23 DIAGNOSIS — I4891 Unspecified atrial fibrillation: Secondary | ICD-10-CM | POA: Diagnosis not present

## 2021-04-23 DIAGNOSIS — Z87891 Personal history of nicotine dependence: Secondary | ICD-10-CM | POA: Insufficient documentation

## 2021-04-23 DIAGNOSIS — Z955 Presence of coronary angioplasty implant and graft: Secondary | ICD-10-CM | POA: Diagnosis not present

## 2021-04-23 DIAGNOSIS — I252 Old myocardial infarction: Secondary | ICD-10-CM | POA: Diagnosis not present

## 2021-04-23 DIAGNOSIS — I10 Essential (primary) hypertension: Secondary | ICD-10-CM | POA: Diagnosis not present

## 2021-04-23 DIAGNOSIS — I4819 Other persistent atrial fibrillation: Secondary | ICD-10-CM

## 2021-04-23 DIAGNOSIS — I2510 Atherosclerotic heart disease of native coronary artery without angina pectoris: Secondary | ICD-10-CM

## 2021-04-23 DIAGNOSIS — D6869 Other thrombophilia: Secondary | ICD-10-CM | POA: Diagnosis not present

## 2021-04-23 DIAGNOSIS — Z7901 Long term (current) use of anticoagulants: Secondary | ICD-10-CM | POA: Insufficient documentation

## 2021-04-23 HISTORY — PX: ATRIAL FIBRILLATION ABLATION: EP1191

## 2021-04-23 LAB — POCT ACTIVATED CLOTTING TIME
Activated Clotting Time: 299 seconds
Activated Clotting Time: 311 seconds

## 2021-04-23 LAB — GLUCOSE, CAPILLARY
Glucose-Capillary: 108 mg/dL — ABNORMAL HIGH (ref 70–99)
Glucose-Capillary: 149 mg/dL — ABNORMAL HIGH (ref 70–99)

## 2021-04-23 SURGERY — ATRIAL FIBRILLATION ABLATION
Anesthesia: General

## 2021-04-23 MED ORDER — ONDANSETRON HCL 4 MG/2ML IJ SOLN: 4.0000 mg | Freq: Four times a day (QID) | INTRAMUSCULAR | Status: DC | PRN

## 2021-04-23 MED ORDER — LIDOCAINE 2% (20 MG/ML) 5 ML SYRINGE
INTRAMUSCULAR | Status: DC | PRN
  Administered 2021-04-23: 60 mg via INTRAVENOUS

## 2021-04-23 MED ORDER — SODIUM CHLORIDE 0.9 % IV SOLN: INTRAVENOUS | Status: DC

## 2021-04-23 MED ORDER — ACETAMINOPHEN 500 MG PO TABS
ORAL_TABLET | ORAL | Status: AC
  Administered 2021-04-23: 1000 mg via ORAL
  Filled 2021-04-23: qty 2

## 2021-04-23 MED ORDER — FENTANYL CITRATE (PF) 100 MCG/2ML IJ SOLN
INTRAMUSCULAR | Status: DC | PRN
  Administered 2021-04-23 (×2): 50 ug via INTRAVENOUS

## 2021-04-23 MED ORDER — SODIUM CHLORIDE 0.9 % IV SOLN: 250.0000 mL | INTRAVENOUS | Status: DC | PRN

## 2021-04-23 MED ORDER — HEPARIN (PORCINE) IN NACL 1000-0.9 UT/500ML-% IV SOLN
INTRAVENOUS | Status: AC
  Filled 2021-04-23: qty 500

## 2021-04-23 MED ORDER — PROPOFOL 10 MG/ML IV BOLUS
INTRAVENOUS | Status: DC | PRN
  Administered 2021-04-23: 140 mg via INTRAVENOUS

## 2021-04-23 MED ORDER — SUGAMMADEX SODIUM 200 MG/2ML IV SOLN
INTRAVENOUS | Status: DC | PRN
  Administered 2021-04-23: 200 mg via INTRAVENOUS

## 2021-04-23 MED ORDER — HEPARIN SODIUM (PORCINE) 1000 UNIT/ML IJ SOLN
INTRAMUSCULAR | Status: DC | PRN
Start: 2021-04-23 — End: 2021-04-23
  Administered 2021-04-23: 3000 [IU] via INTRAVENOUS
  Administered 2021-04-23: 14000 [IU] via INTRAVENOUS
  Administered 2021-04-23: 2000 [IU] via INTRAVENOUS

## 2021-04-23 MED ORDER — DEXAMETHASONE SODIUM PHOSPHATE 4 MG/ML IJ SOLN
INTRAMUSCULAR | Status: DC | PRN
  Administered 2021-04-23: 5 mg via INTRAVENOUS

## 2021-04-23 MED ORDER — APIXABAN 5 MG PO TABS
5.0000 mg | ORAL_TABLET | Freq: Once | ORAL | Status: AC
  Administered 2021-04-23: 5 mg via ORAL
  Filled 2021-04-23 (×2): qty 1

## 2021-04-23 MED ORDER — SODIUM CHLORIDE 0.9% FLUSH: 3.0000 mL | Freq: Two times a day (BID) | INTRAVENOUS | Status: DC

## 2021-04-23 MED ORDER — ONDANSETRON HCL 4 MG/2ML IJ SOLN
INTRAMUSCULAR | Status: DC | PRN
  Administered 2021-04-23: 4 mg via INTRAVENOUS

## 2021-04-23 MED ORDER — PHENYLEPHRINE 80 MCG/ML (10ML) SYRINGE FOR IV PUSH (FOR BLOOD PRESSURE SUPPORT)
PREFILLED_SYRINGE | INTRAVENOUS | Status: DC | PRN
Start: 2021-04-23 — End: 2021-04-23
  Administered 2021-04-23 (×3): 80 ug via INTRAVENOUS
  Administered 2021-04-23: 160 ug via INTRAVENOUS

## 2021-04-23 MED ORDER — HEPARIN SODIUM (PORCINE) 1000 UNIT/ML IJ SOLN
INTRAMUSCULAR | Status: AC
  Filled 2021-04-23: qty 10

## 2021-04-23 MED ORDER — ACETAMINOPHEN 325 MG PO TABS
650.0000 mg | ORAL_TABLET | ORAL | Status: DC | PRN
  Filled 2021-04-23: qty 2

## 2021-04-23 MED ORDER — ROCURONIUM BROMIDE 100 MG/10ML IV SOLN
INTRAVENOUS | Status: DC | PRN
  Administered 2021-04-23: 20 mg via INTRAVENOUS
  Administered 2021-04-23: 60 mg via INTRAVENOUS

## 2021-04-23 MED ORDER — PHENYLEPHRINE HCL-NACL 20-0.9 MG/250ML-% IV SOLN
INTRAVENOUS | Status: DC | PRN
  Administered 2021-04-23: 50 ug/min via INTRAVENOUS

## 2021-04-23 MED ORDER — PROTAMINE SULFATE 10 MG/ML IV SOLN
INTRAVENOUS | Status: DC | PRN
  Administered 2021-04-23: 40 mg via INTRAVENOUS

## 2021-04-23 MED ORDER — DOBUTAMINE INFUSION FOR EP/ECHO/NUC (1000 MCG/ML)
INTRAVENOUS | Status: DC | PRN
  Administered 2021-04-23: 20 ug/kg/min via INTRAVENOUS

## 2021-04-23 MED ORDER — HEPARIN (PORCINE) IN NACL 1000-0.9 UT/500ML-% IV SOLN
INTRAVENOUS | Status: DC | PRN
  Administered 2021-04-23 (×5): 500 mL

## 2021-04-23 MED ORDER — SODIUM CHLORIDE 0.9% FLUSH: 3.0000 mL | INTRAVENOUS | Status: DC | PRN

## 2021-04-23 MED ORDER — HEPARIN SODIUM (PORCINE) 1000 UNIT/ML IJ SOLN
INTRAMUSCULAR | Status: DC | PRN
  Administered 2021-04-23: 1000 [IU] via INTRAVENOUS

## 2021-04-23 MED ORDER — ACETAMINOPHEN 500 MG PO TABS: 1000.0000 mg | ORAL_TABLET | Freq: Once | ORAL | Status: AC

## 2021-04-23 MED ORDER — DOBUTAMINE INFUSION FOR EP/ECHO/NUC (1000 MCG/ML)
INTRAVENOUS | Status: AC
  Filled 2021-04-23: qty 250

## 2021-04-23 SURGICAL SUPPLY — 22 items
BAG SNAP BAND KOVER 36X36 (MISCELLANEOUS) ×1 IMPLANT
BLANKET WARM UNDERBOD FULL ACC (MISCELLANEOUS) ×2 IMPLANT
CATH 8FR REPROCESSED SOUNDSTAR (CATHETERS) ×2 IMPLANT
CATH 8FR SOUNDSTAR REPROCESSED (CATHETERS) IMPLANT
CATH OCTARAY 2.0 F 3-3-3-3-3 (CATHETERS) ×1 IMPLANT
CATH S CIRCA THERM PROBE 10F (CATHETERS) ×1 IMPLANT
CATH SMTCH THERMOCOOL SF DF (CATHETERS) ×1 IMPLANT
CATH WEB BI DIR CSDF CRV REPRO (CATHETERS) ×1 IMPLANT
CLOSURE PERCLOSE PROSTYLE (VASCULAR PRODUCTS) ×4 IMPLANT
COVER SWIFTLINK CONNECTOR (BAG) ×2 IMPLANT
KIT VERSACROSS STEERABLE 180 (KITS) ×1 IMPLANT
MAT PREVALON FULL STRYKER (MISCELLANEOUS) ×1 IMPLANT
PACK EP LATEX FREE (CUSTOM PROCEDURE TRAY) ×2
PACK EP LF (CUSTOM PROCEDURE TRAY) ×1 IMPLANT
PAD DEFIB RADIO PHYSIO CONN (PAD) ×2 IMPLANT
PATCH CARTO3 (PAD) ×1 IMPLANT
SHEATH CARTO VIZIGO SM CVD (SHEATH) ×1 IMPLANT
SHEATH PINNACLE 7F 10CM (SHEATH) ×1 IMPLANT
SHEATH PINNACLE 8F 10CM (SHEATH) ×2 IMPLANT
SHEATH PINNACLE 9F 10CM (SHEATH) ×1 IMPLANT
SHEATH PROBE COVER 6X72 (BAG) ×1 IMPLANT
TUBING SMART ABLATE COOLFLOW (TUBING) ×1 IMPLANT

## 2021-04-23 NOTE — Transfer of Care (Signed)
Immediate Anesthesia Transfer of Care Note ? ?Patient: Travis Howell ? ?Procedure(s) Performed: ATRIAL FIBRILLATION ABLATION ? ?Patient Location: PACU ? ?Anesthesia Type:General ? ?Level of Consciousness: awake and alert  ? ?Airway & Oxygen Therapy: Patient Spontanous Breathing and Patient connected to face mask oxygen ? ?Post-op Assessment: Report given to RN and Post -op Vital signs reviewed and stable ? ?Post vital signs: Reviewed and stable ? ?Last Vitals:  ?Vitals Value Taken Time  ?BP 144/90 04/23/21 1108  ?Temp 36.3 ?C 04/23/21 1109  ?Pulse 65 04/23/21 1110  ?Resp 14 04/23/21 1110  ?SpO2 100 % 04/23/21 1110  ?Vitals shown include unvalidated device data. ? ?Last Pain:  ?Vitals:  ? 04/23/21 1109  ?TempSrc: Temporal  ?PainSc: 0-No pain  ?   ? ?  ? ?Complications: There were no known notable events for this encounter. ?

## 2021-04-23 NOTE — Interval H&P Note (Signed)
History and Physical Interval Note: ? ?04/23/2021 ?7:52 AM ? ?BRADSHAW MINIHAN  has presented today for surgery, with the diagnosis of afib.  The various methods of treatment have been discussed with the patient and family. After consideration of risks, benefits and other options for treatment, the patient has consented to  Procedure(s): ?[place] (N/A) as a surgical intervention.  The patient's history has been reviewed, patient examined, no change in status, stable for surgery.  I have reviewed the patient's chart and labs.  Questions were answered to the patient's satisfaction.   ? ? ?Lionel Woodberry Hassell [place] ? ? ?

## 2021-04-23 NOTE — Anesthesia Procedure Notes (Signed)
Procedure Name: Intubation ?Date/Time: 04/23/2021 8:48 AM ?Performed by: Lieutenant Diego, CRNA ?Pre-anesthesia Checklist: Patient identified, Emergency Drugs available, Suction available and Patient being monitored ?Patient Re-evaluated:Patient Re-evaluated prior to induction ?Oxygen Delivery Method: Circle system utilized ?Preoxygenation: Pre-oxygenation with 100% oxygen ?Induction Type: IV induction ?Ventilation: Mask ventilation without difficulty ?Laryngoscope Size: Sabra Heck and 2 ?Grade View: Grade I ?Tube type: Oral ?Tube size: 7.5 mm ?Number of attempts: 1 ?Airway Equipment and Method: Stylet and Oral airway ?Placement Confirmation: ETT inserted through vocal cords under direct vision, positive ETCO2 and breath sounds checked- equal and bilateral ?Secured at: 23 cm ?Tube secured with: Tape ?Dental Injury: Teeth and Oropharynx as per pre-operative assessment  ? ? ? ? ?

## 2021-04-23 NOTE — Anesthesia Postprocedure Evaluation (Signed)
Anesthesia Post Note ? ?Patient: Travis Howell ? ?Procedure(s) Performed: ATRIAL FIBRILLATION ABLATION ? ?  ? ?Patient location during evaluation: PACU ?Anesthesia Type: General ?Level of consciousness: awake and alert ?Pain management: pain level controlled ?Vital Signs Assessment: post-procedure vital signs reviewed and stable ?Respiratory status: spontaneous breathing, nonlabored ventilation and respiratory function stable ?Cardiovascular status: stable and blood pressure returned to baseline ?Anesthetic complications: no ? ? ?There were no known notable events for this encounter. ? ?Last Vitals:  ?Vitals:  ? 04/23/21 1140 04/23/21 1143  ?BP:  120/80  ?Pulse: 72   ?Resp: 12   ?Temp: 36.4 ?C   ?SpO2: 96%   ?  ?Last Pain:  ?Vitals:  ? 04/23/21 1140  ?TempSrc: Temporal  ?PainSc: 0-No pain  ? ? ?  ?  ?  ?  ?  ?  ? ?Audry Pili ? ? ? ? ?

## 2021-04-24 ENCOUNTER — Encounter (HOSPITAL_COMMUNITY): Payer: Self-pay | Admitting: Cardiology

## 2021-04-24 ENCOUNTER — Emergency Department (HOSPITAL_COMMUNITY)
Admission: EM | Admit: 2021-04-24 | Discharge: 2021-04-24 | Disposition: A | Discharge status: Home / Self Care | Payer: Medicare Other | Attending: Emergency Medicine | Admitting: Emergency Medicine

## 2021-04-24 DIAGNOSIS — Z79899 Other long term (current) drug therapy: Secondary | ICD-10-CM | POA: Insufficient documentation

## 2021-04-24 DIAGNOSIS — L7622 Postprocedural hemorrhage and hematoma of skin and subcutaneous tissue following other procedure: Secondary | ICD-10-CM | POA: Diagnosis present

## 2021-04-24 DIAGNOSIS — R58 Hemorrhage, not elsewhere classified: Secondary | ICD-10-CM

## 2021-04-24 DIAGNOSIS — Z7984 Long term (current) use of oral hypoglycemic drugs: Secondary | ICD-10-CM | POA: Diagnosis not present

## 2021-04-24 DIAGNOSIS — Z7901 Long term (current) use of anticoagulants: Secondary | ICD-10-CM | POA: Insufficient documentation

## 2021-04-24 NOTE — Progress Notes (Signed)
?  Called to inspect groin site post ablation 4/19.   ? ?Pt states fine throughout the day yesterday but woke up with oozing this am.  ? ?Pressure held in ED with cessation. No further.  ? ?Pt has throat irritation s/p intubation for procedure and has been doing large, forceful coughs to try to rid himself of urge to cough and clear "phlegm". Instructed to limit these if possible; If needed, would hold light pressure over groin site when coughing forcefully.  ? ?Reviewed above with Dr. Curt Bears over phone who agreed.  ? ?Site stable as below. Ok to d/c home with f/u as planned.  ? ?Lollie Marrow, PA-C  ?04/24/2021 3:14 PM  ? ? ? ?

## 2021-04-24 NOTE — ED Triage Notes (Signed)
Pt BIB [facility] from home after calling for a surgical site bleeding after ablation yesterday. Patient reports surgical site is seeping through dressing, EMS reports site bled through one 4x4 en route. No evidence of arterial bleed. Patient A&O x4, NAD, no pain reported.  ?

## 2021-04-24 NOTE — ED Provider Notes (Signed)
?[facility] ?Provider Note ? ? ?CSN: [id number] ?Arrival date & time: 04/24/21  1331 ? ?  ? ?History ? ?Chief Complaint  ?Patient presents with  ? Post-op Problem  ? ? ?Travis Howell is a 71 y.o. male.  Presents to ER due to concern for bleeding from his groin site.  Patient had ablation on 4/19.  This morning he noted oozing from the site.  He otherwise feels like he has been feeling quite well.  Was not aware of any complications from the procedure. ? ?Additional history was obtained from review of chart, review H&P from Dr. Curt Bears yesterday.  Ablation was for atrial fibrillation. ? ?HPI ? ?  ? ?Home Medications ?Prior to Admission medications   ?Medication Sig Start Date End Date Taking? Authorizing Provider  ?albuterol (VENTOLIN HFA) 108 (90 Base) MCG/ACT inhaler USE 2 INHALATIONS BY MOUTH  EVERY 6 HOURS AS NEEDED FOR WHEEZING OR SHORTNESS OF  BREATH ?Patient taking differently: Inhale 2 puffs into the lungs 3 (three) times daily as needed for wheezing or shortness of breath. 02/12/20  Yes [name], MD  ?aspirin EC 81 MG tablet Take 81 mg by mouth daily. Swallow whole.   Yes [provider]  ?colchicine 0.6 MG tablet TAKE 1 TABLET BY MOUTH  DAILY AS NEEDED FOR GOUT  FLARE ?Patient taking differently: Take 0.6 mg by mouth daily as needed (gout). 11/09/20  Yes [name], MD  ?diphenhydrAMINE-APAP, sleep, (TYLENOL PM EXTRA STRENGTH PO) Take 2 tablets by mouth at bedtime as needed (pain).   Yes [provider]  ?ezetimibe (ZETIA) 10 MG tablet Take 1 tablet (10 mg total) by mouth daily. 01/29/21  Yes [name], MD  ?metFORMIN (GLUCOPHAGE) 500 MG tablet Take 1 tablet (500 mg total) by mouth daily with breakfast. 01/29/21  Yes [name], MD  ?metoprolol succinate (TOPROL-XL) 50 MG 24 hr tablet Take 1.5 tablets (75 mg total) by mouth 2 (two) times daily. Take with or immediately following a meal. 01/29/21  Yes [name], MD   ?Multiple Vitamin (MULTIVITAMIN ADULT PO) Take 2 tablets by mouth daily.   Yes [provider]  ?naproxen sodium (ALEVE) 220 MG tablet Take 440 mg by mouth daily as needed (pain).   Yes [provider]  ?Omega-3 Fatty Acids (FISH OIL) 1000 MG CAPS Take 3,000 mg by mouth daily.   Yes [provider]  ?potassium chloride (KLOR-CON) 20 MEQ packet Take 10 mEq by mouth daily.   Yes [provider]  ?rosuvastatin (CRESTOR) 10 MG tablet Take 1 tablet (10 mg total) by mouth daily. 01/29/21  Yes [name], MD  ?silodosin (RAPAFLO) 8 MG CAPS capsule Take 1 capsule (8 mg total) by mouth daily with breakfast. 01/29/21  Yes [name], MD  ?apixaban (ELIQUIS) 5 MG TABS tablet Take 1 tablet (5 mg total) by mouth 2 (two) times daily. 12/06/20   Camnitz, Ocie Doyne, MD  ?OVER THE COUNTER MEDICATION Place 4-5 drops into both ears daily as needed (ear wax). OTC ear drop for wax.    [provider]  ?   ? ?Allergies    ?Atorvastatin and Pravastatin   ? ?Review of Systems   ?Review of Systems  ?All other systems reviewed and are negative. ? ?Physical Exam ?Updated Vital Signs ?BP 132/78   Pulse 73   Temp 98 ?F (36.7 ?C) (Oral)   Resp 17   SpO2 95%  ?Physical Exam ?Vitals and nursing note reviewed.  ?Constitutional:   ?  General: He is not in acute distress. ?   Appearance: He is well-developed.  ?HENT:  ?   Head: Normocephalic and atraumatic.  ?Eyes:  ?   Conjunctiva/sclera: Conjunctivae normal.  ?Cardiovascular:  ?   Rate and Rhythm: Normal rate.  ?   Pulses: Normal pulses.  ?Pulmonary:  ?   Effort: Pulmonary effort is normal. No respiratory distress.  ?Abdominal:  ?   Palpations: Abdomen is soft.  ?   Tenderness: There is no abdominal tenderness.  ?Musculoskeletal:     ?   General: No swelling.  ?   Cervical back: Neck supple.  ?   Comments: Left groin venipuncture site has mild but steady venous ooze of blood  ?Skin: ?   General: Skin is warm and dry.  ?   Capillary  Refill: Capillary refill takes less than 2 seconds.  ?Neurological:  ?   Mental Status: He is alert.  ?Psychiatric:     ?   Mood and Affect: Mood normal.  ? ? ?ED Results / Procedures / Treatments   ?Labs ?(all labs ordered are listed, but only abnormal results are displayed) ?Labs Reviewed - No data to display ? ?EKG ?EKG Interpretation ? ?Date/Time:  Thursday April 24 2021 13:41:55 EDT ?Ventricular Rate:  83 ?PR Interval:  209 ?QRS Duration: 102 ?QT Interval:  382 ?QTC Calculation: 449 ?R Axis:   -23 ?Text Interpretation: Sinus rhythm Borderline left axis deviation Confirmed by Madalyn Rob [phone]) on 04/24/2021 2:48:27 PM ? ?Radiology ?EP STUDY ? ?Result Date: 04/23/2021 ?SURGEON:  Will Curt Bears, MD PREPROCEDURE DIAGNOSES: 1. Persistent atrial fibrillation. POSTPROCEDURE DIAGNOSES: 1. Persistent atrial fibrillation. PROCEDURES: 1. Comprehensive electrophysiologic study. 2. Coronary sinus pacing and recording. 3. Three-dimensional mapping of atrial fibrillation (with additional mapping and ablation within the left atrium due to persistence of afib) 4. Ablation of atrial fibrillation (with additional mapping and ablation within the left atrium due to persistence of afib) 5. Intracardiac echocardiography. 6. Transseptal puncture of an intact septum. 7. Arrhythmia induction with pacing 8. External cardioversion. INTRODUCTION:  Travis Howell is a 71 y.o. male with a history of persistent atrial fibrillation who now presents for EP study and radiofrequency ablation.  The patient reports initially being diagnosed with atrial fibrillation after presenting with symptomatic palpitations and fatgiue.  The patient has failed medical therapy.  The patient therefore presents today for catheter ablation of atrial fibrillation. DESCRIPTION OF PROCEDURE:  Informed written consent was obtained, and the patient was brought to the electrophysiology lab in a fasting state.  The patient was adequately sedated with intravenous  medications as outlined in the anesthesia report.  The patient's left and right groins were prepped and draped in the usual sterile fashion by the EP lab staff.  Using a percutaneous Seldinger technique, two 8-French hemostasis sheaths were placed in the right femoral vein, and one 7 [place] and one 11-French hemostasis sheaths were placed into the left common femoral vein. An esophageal temperature probe was inserted to monitor for heating of the esophagus during the procedure. Direct ultrasound guidance is used for right and left femoral veins with normal vessel patency. Ultrasound images are captured and stored in the patient's chart. Using ultrasound guidance, the Brockenbrough needle and wire were visualized entering the vessel. Catheter Placement:  A 7-French Biosense Webster Decapolar coronary sinus catheter was introduced through the right common femoral vein and advanced into the coronary sinus for recording and pacing from this location.  A luminal esophageal temperature probe was placed and used for  continuous monitoring of the luminal esophageal temperature throughout the procedure as well as to localize the esophagus on fluoroscopy. In addition, the esophagus was directly visualized with intracardiac echo and its positioned marked on Carto.  During ablation at the posterior wall there was limited esophageal heating noted during RF energy delivery with the maximal temperature recorded by the luminal temperature probe of < 38.5 degrees C. Initial Measurements: The patient presented to the electrophysiology lab in atrial fibrillation. his QRS duration of 94 msec and a QT interval of 357 msec.  Intracardiac Echocardiography: An 8-French Biosense Webster AcuNav intracardiac echocardiography catheter was introduced through the right common femoral vein and advanced into the right atrium. Intracardiac echocardiography was performed of the left atrium, and a three-dimensional anatomical rendering of the left  atrium was performed using CARTO sound technology.  The patient was noted to have a moderate sized left atrium.  The interatrial septum was prominent but not aneurysmal. All 4 pulmonary veins were visualized and note

## 2021-04-24 NOTE — Discharge Instructions (Signed)
Please follow-up with cardiology as scheduled.  Come back to ER if you have any recurrent bleeding issues. ?

## 2021-04-29 ENCOUNTER — Encounter: Payer: Self-pay | Admitting: Nurse Practitioner

## 2021-04-29 ENCOUNTER — Ambulatory Visit: Payer: Medicare Other | Admitting: Nurse Practitioner

## 2021-04-29 ENCOUNTER — Other Ambulatory Visit: Payer: Self-pay | Admitting: Nurse Practitioner

## 2021-04-29 ENCOUNTER — Ambulatory Visit (INDEPENDENT_AMBULATORY_CARE_PROVIDER_SITE_OTHER)
Admission: RE | Admit: 2021-04-29 | Discharge: 2021-04-29 | Disposition: A | Discharge status: Home / Self Care | Payer: Medicare Other | Source: Ambulatory Visit | Attending: Nurse Practitioner | Admitting: Nurse Practitioner

## 2021-04-29 VITALS — BP 122/80 | HR 63 | Temp 97.8°F | Ht 67.0 in | Wt 219.0 lb

## 2021-04-29 DIAGNOSIS — R0989 Other specified symptoms and signs involving the circulatory and respiratory systems: Secondary | ICD-10-CM

## 2021-04-29 NOTE — Assessment & Plan Note (Addendum)
Irritation and globus sensation postextubation of a cardiac ablation performed this past Wednesday.  States he has had some improvement.  He has been using salt water gargles along with throat lozenges.  States he is also been using some Flonase.  Encourage patient to continue using Flonase and using other modalities if beneficial.  Since it is improving likely will self resolve.  Did order x-ray soft tissue neck to make sure there is no foreign body there but patient not having difficulty difficulty swallowing or eating.  Follow-up if no improvement pending x-ray results ? ?Recommend patient do an at-home COVID test ?

## 2021-04-29 NOTE — Progress Notes (Signed)
? ?  Acute Office Visit ? ?Subjective:  ? ?  ?Patient ID: Travis Howell, male    DOB: 1950/01/22, 71 y.o.   MRN: [id number] ? ?Chief Complaint  ?Patient presents with  ? Sore Throat  ?  Sore throat from produce he had done last week,pt  has salt water and sore throat sprays  , feels like it something in his throat   ? ? ? ?Patient is in today for Sore throat ? ?States that he had an ablation on Wednesday at [facility]. States that he was ok prior to procedure. States that since the procedure he has a globus senstatoin in his throat. He figures it is from the intubation. Has tried cough drops and salt water and mouth wash. Without relief  No trouble swallowing, eating talking or short of breath ? ?Goes back for follow up in approximately 2 to 3 weeks. ? ? ?Review of Systems  ?Constitutional:  Negative for chills and fever.  ?HENT:  Positive for congestion and sore throat (in the morning better after coofee). Negative for ear discharge, ear pain and sinus pain.   ?Respiratory:  Negative for shortness of breath.   ?Cardiovascular:  Negative for chest pain.  ? ? ?   ?Objective:  ?  ?BP 122/80   Pulse 63   Temp 97.8 ?F (36.6 ?C)   Ht '5\' 7"'$  (1.702 m)   Wt 219 lb (99.3 kg)   SpO2 97%   BMI 34.30 kg/m?  ? ? ?Physical Exam ?Vitals and nursing note reviewed.  ?Constitutional:   ?   Appearance: He is well-developed.  ?HENT:  ?   Right Ear: Tympanic membrane, ear canal and external ear normal.  ?   Left Ear: Tympanic membrane, ear canal and external ear normal.  ?   Mouth/Throat:  ?   Mouth: Mucous membranes are moist.  ?   Pharynx: Oropharynx is clear.  ?Neck:  ?   Thyroid: No thyroid mass, thyromegaly or thyroid tenderness.  ?Cardiovascular:  ?   Rate and Rhythm: Normal rate and regular rhythm.  ?   Heart sounds: Normal heart sounds.  ?Pulmonary:  ?   Effort: Pulmonary effort is normal.  ?   Breath sounds: Normal breath sounds.  ?Lymphadenopathy:  ?   Cervical: No cervical adenopathy.  ?Neurological:  ?   Mental  Status: He is alert.  ? ? ?No results found for any visits on 04/29/21. ? ? ?   ?Assessment & Plan:  ? ?Problem List Items Addressed This Visit   ? ?  ? Other  ? Globus sensation - Primary  ?  Irritation and globus sensation postextubation of a cardiac ablation performed this past Wednesday.  States he has had some improvement.  He has been using salt water gargles along with throat lozenges.  States he is also been using some Flonase.  Encourage patient to continue using Flonase and using other modalities if beneficial.  Since it is improving likely will self resolve.  Did order x-ray soft tissue neck to make sure there is no foreign body there but patient not having difficulty difficulty swallowing or eating.  Follow-up if no improvement pending x-ray results ? ?Recommend patient do an at-home COVID test ? ?  ?  ? ? ?No orders of the defined types were placed in this encounter. ? ? ?No follow-ups on file. ? ?Romilda Garret, NP ? ? ?

## 2021-04-29 NOTE — Patient Instructions (Addendum)
Nice to see you today ?I will be in touch with the xray result once I have it ?I am thinking this will self resolve. Continue using the salt water gargles and cough drops if they help. I was also use the flonase for the next week ?Follow up if no improvement ? ?

## 2021-05-04 ENCOUNTER — Other Ambulatory Visit: Payer: Self-pay | Admitting: Cardiology

## 2021-05-04 DIAGNOSIS — I4891 Unspecified atrial fibrillation: Secondary | ICD-10-CM

## 2021-05-04 DIAGNOSIS — Z5181 Encounter for therapeutic drug level monitoring: Secondary | ICD-10-CM

## 2021-05-05 NOTE — Telephone Encounter (Signed)
Eliquis '5mg'$  refill request received. Patient is 71 years old, weight-99.3kg, Crea-0.80 on 04/10/2021, Diagnosis-Afib, and last seen by Dr. Curt Bears on 04/21/2021. Dose is appropriate based on dosing criteria. Will send in refill to requested pharmacy.   ?

## 2021-05-21 ENCOUNTER — Ambulatory Visit (HOSPITAL_COMMUNITY)
Admission: RE | Admit: 2021-05-21 | Discharge: 2021-05-21 | Disposition: A | Discharge status: Home / Self Care | Payer: Medicare Other | Source: Ambulatory Visit | Attending: Physician Assistant | Admitting: Physician Assistant

## 2021-05-21 ENCOUNTER — Encounter (HOSPITAL_COMMUNITY): Payer: Self-pay | Admitting: Physician Assistant

## 2021-05-21 VITALS — BP 132/84 | HR 64 | Ht 67.0 in | Wt 217.0 lb

## 2021-05-21 DIAGNOSIS — Z6833 Body mass index (BMI) 33.0-33.9, adult: Secondary | ICD-10-CM | POA: Insufficient documentation

## 2021-05-21 DIAGNOSIS — I2510 Atherosclerotic heart disease of native coronary artery without angina pectoris: Secondary | ICD-10-CM | POA: Diagnosis not present

## 2021-05-21 DIAGNOSIS — I4892 Unspecified atrial flutter: Secondary | ICD-10-CM | POA: Insufficient documentation

## 2021-05-21 DIAGNOSIS — I4819 Other persistent atrial fibrillation: Secondary | ICD-10-CM | POA: Insufficient documentation

## 2021-05-21 DIAGNOSIS — I10 Essential (primary) hypertension: Secondary | ICD-10-CM | POA: Insufficient documentation

## 2021-05-21 DIAGNOSIS — Z7901 Long term (current) use of anticoagulants: Secondary | ICD-10-CM | POA: Insufficient documentation

## 2021-05-21 DIAGNOSIS — E785 Hyperlipidemia, unspecified: Secondary | ICD-10-CM | POA: Diagnosis not present

## 2021-05-21 DIAGNOSIS — D6869 Other thrombophilia: Secondary | ICD-10-CM | POA: Insufficient documentation

## 2021-05-21 DIAGNOSIS — E669 Obesity, unspecified: Secondary | ICD-10-CM | POA: Diagnosis not present

## 2021-05-21 DIAGNOSIS — E119 Type 2 diabetes mellitus without complications: Secondary | ICD-10-CM | POA: Insufficient documentation
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
## 2021-06-21 ENCOUNTER — Other Ambulatory Visit: Payer: Self-pay | Admitting: Family Medicine

## 2021-06-23 NOTE — Telephone Encounter (Signed)
Colchicine Last filled:  04/28/21, #90 Last OV:  01/29/21, CPE Next OV:  07/29/21, 6 mo DM f/u

## 2021-06-25 NOTE — Telephone Encounter (Signed)
ERx but without refills as this is PRN medication.

## 2021-06-27 ENCOUNTER — Other Ambulatory Visit: Payer: Self-pay | Admitting: Family Medicine

## 2021-07-28 ENCOUNTER — Encounter: Payer: Self-pay | Admitting: Cardiology

## 2021-07-28 ENCOUNTER — Ambulatory Visit: Payer: Medicare Other | Admitting: Cardiology

## 2021-07-28 VITALS — BP 120/76 | HR 55 | Ht 67.0 in | Wt 220.6 lb

## 2021-07-28 DIAGNOSIS — I4819 Other persistent atrial fibrillation: Secondary | ICD-10-CM

## 2021-07-28 DIAGNOSIS — D6869 Other thrombophilia: Secondary | ICD-10-CM | POA: Diagnosis not present

## 2021-07-28 MED ORDER — METOPROLOL SUCCINATE ER 100 MG PO TB24: 100.0000 mg | ORAL_TABLET | Freq: Every day | ORAL | 1 refills | Status: DC
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
## 2021-07-28 NOTE — Patient Instructions (Addendum)
Medication Instructions:  Your physician has recommended you make the following change in your medication:  TAKE Toprol 100 mg ONCE daily at bedtime  *If you need a refill on your cardiac medications before your next appointment, please call your pharmacy*   Lab Work: None ordered   Testing/Procedures: None ordered   Follow-Up: At [facility], you and your health needs are our priority.  As part of our continuing mission to provide you with exceptional heart care, we have created designated Provider Care Teams.  These Care Teams include your primary Cardiologist (physician) and Advanced Practice Providers (APPs -  Physician Assistants and Nurse Practitioners) who all work together to provide you with the care you need, when you need it.  Your next appointment:   6 month(s)  The format for your next appointment:   In Person  Provider:   You will follow up in the [facility] located at [facility]. Your provider will be: Roderic Palau, NP or Clint R. Fenton, PA-C    Thank you for choosing [facility]!!   Trinidad Curet, RN [phone]  Other Instructions   Important Information About Sugar

## 2021-07-29 ENCOUNTER — Ambulatory Visit: Payer: Medicare Other | Admitting: Family Medicine

## 2021-07-29 ENCOUNTER — Encounter: Payer: Self-pay | Admitting: Family Medicine

## 2021-07-29 VITALS — BP 124/76 | HR 59 | Temp 98.1°F | Ht 67.0 in | Wt 220.1 lb

## 2021-07-29 DIAGNOSIS — E669 Obesity, unspecified: Secondary | ICD-10-CM

## 2021-07-29 DIAGNOSIS — R0989 Other specified symptoms and signs involving the circulatory and respiratory systems: Secondary | ICD-10-CM | POA: Insufficient documentation

## 2021-07-29 DIAGNOSIS — E66811 Obesity, class 1: Secondary | ICD-10-CM

## 2021-07-29 DIAGNOSIS — I4891 Unspecified atrial fibrillation: Secondary | ICD-10-CM | POA: Diagnosis not present

## 2021-07-29 DIAGNOSIS — E118 Type 2 diabetes mellitus with unspecified complications: Secondary | ICD-10-CM

## 2021-07-29 LAB — POCT GLYCOSYLATED HEMOGLOBIN (HGB A1C): Hemoglobin A1C: 6.8 % — AB (ref 4.0–5.6)

## 2021-07-29 NOTE — Patient Instructions (Addendum)
Work on regular exercise routine. Start 10-15 min per day a few days a week. Goal (American Heart Association) is 150 minutes/week of moderate intensity aerobic exercise.  Call and schedule eye doctor for 09/2021.  Return in 6 months for wellness visit/physical.   Diabetes Mellitus and Nutrition, Adult When you have diabetes, or diabetes mellitus, it is very important to have healthy eating habits because your blood sugar (glucose) levels are greatly affected by what you eat and drink. Eating healthy foods in the right amounts, at about the same times every day, can help you: Manage your blood glucose. Lower your risk of heart disease. Improve your blood pressure. Reach or maintain a healthy weight. What can affect my meal plan? Every person with diabetes is different, and each person has different needs for a meal plan. Your health care provider may recommend that you work with a dietitian to make a meal plan that is best for you. Your meal plan may vary depending on factors such as: The calories you need. The medicines you take. Your weight. Your blood glucose, blood pressure, and cholesterol levels. Your activity level. Other health conditions you have, such as heart or kidney disease. How do carbohydrates affect me? Carbohydrates, also called carbs, affect your blood glucose level more than any other type of food. Eating carbs raises the amount of glucose in your blood. It is important to know how many carbs you can safely have in each meal. This is different for every person. Your dietitian can help you calculate how many carbs you should have at each meal and for each snack. How does alcohol affect me? Alcohol can cause a decrease in blood glucose (hypoglycemia), especially if you use insulin or take certain diabetes medicines by mouth. Hypoglycemia can be a life-threatening condition. Symptoms of hypoglycemia, such as sleepiness, dizziness, and confusion, are similar to symptoms of having  too much alcohol. Do not drink alcohol if: Your health care provider tells you not to drink. You are pregnant, may be pregnant, or are planning to become pregnant. If you drink alcohol: Limit how much you have to: 0-1 drink a day for women. 0-2 drinks a day for men. Know how much alcohol is in your drink. In the [place] one drink equals one 12 oz bottle of beer (355 mL), one 5 oz glass of wine (148 mL), or one 1 oz glass of hard liquor (44 mL). Keep yourself hydrated with water, diet soda, or unsweetened iced tea. Keep in mind that regular soda, juice, and other mixers may contain a lot of sugar and must be counted as carbs. What are tips for following this plan?  Reading food labels Start by checking the serving size on the Nutrition Facts label of packaged foods and drinks. The number of calories and the amount of carbs, fats, and other nutrients listed on the label are based on one serving of the item. Many items contain more than one serving per package. Check the total grams (g) of carbs in one serving. Check the number of grams of saturated fats and trans fats in one serving. Choose foods that have a low amount or none of these fats. Check the number of milligrams (mg) of salt (sodium) in one serving. Most people should limit total sodium intake to less than 2,300 mg per day. Always check the nutrition information of foods labeled as "low-fat" or "nonfat." These foods may be higher in added sugar or refined carbs and should be avoided. Talk to your  dietitian to identify your daily goals for nutrients listed on the label. Shopping Avoid buying canned, pre-made, or processed foods. These foods tend to be high in fat, sodium, and added sugar. Shop around the outside edge of the grocery store. This is where you will most often find fresh fruits and vegetables, bulk grains, fresh meats, and fresh dairy products. Cooking Use low-heat cooking methods, such as baking, instead of high-heat cooking  methods, such as deep frying. Cook using healthy oils, such as olive, canola, or sunflower oil. Avoid cooking with butter, cream, or high-fat meats. Meal planning Eat meals and snacks regularly, preferably at the same times every day. Avoid going long periods of time without eating. Eat foods that are high in fiber, such as fresh fruits, vegetables, beans, and whole grains. Eat 4-6 oz (112-168 g) of lean protein each day, such as lean meat, chicken, fish, eggs, or tofu. One ounce (oz) (28 g) of lean protein is equal to: 1 oz (28 g) of meat, chicken, or fish. 1 egg.  cup (62 g) of tofu. Eat some foods each day that contain healthy fats, such as avocado, nuts, seeds, and fish. What foods should I eat? Fruits Berries. Apples. Oranges. Peaches. Apricots. Plums. Grapes. Mangoes. Papayas. Pomegranates. Kiwi. Cherries. Vegetables Leafy greens, including lettuce, spinach, kale, chard, collard greens, mustard greens, and cabbage. Beets. Cauliflower. Broccoli. Carrots. Green beans. Tomatoes. Peppers. Onions. Cucumbers. Brussels sprouts. Grains Whole grains, such as whole-wheat or whole-grain bread, crackers, tortillas, cereal, and pasta. Unsweetened oatmeal. Quinoa. Brown or wild rice. Meats and other proteins Seafood. Poultry without skin. Lean cuts of poultry and beef. Tofu. Nuts. Seeds. Dairy Low-fat or fat-free dairy products such as milk, yogurt, and cheese. The items listed above may not be a complete list of foods and beverages you can eat and drink. Contact a dietitian for more information. What foods should I avoid? Fruits Fruits canned with syrup. Vegetables Canned vegetables. Frozen vegetables with butter or cream sauce. Grains Refined white flour and flour products such as bread, pasta, snack foods, and cereals. Avoid all processed foods. Meats and other proteins Fatty cuts of meat. Poultry with skin. Breaded or fried meats. Processed meat. Avoid saturated fats. Dairy Full-fat  yogurt, cheese, or milk. Beverages Sweetened drinks, such as soda or iced tea. The items listed above may not be a complete list of foods and beverages you should avoid. Contact a dietitian for more information. Questions to ask a health care provider Do I need to meet with a certified diabetes care and education specialist? Do I need to meet with a dietitian? What number can I call if I have questions? When are the best times to check my blood glucose? Where to find more information: American Diabetes Association: diabetes.org Academy of Nutrition and Dietetics: eatright.[employer] of Diabetes and Digestive and Kidney Diseases: [employer] Association of Diabetes Care & Education Specialists: diabeteseducator.org Summary It is important to have healthy eating habits because your blood sugar (glucose) levels are greatly affected by what you eat and drink. It is important to use alcohol carefully. A healthy meal plan will help you manage your blood glucose and lower your risk of heart disease. Your health care provider may recommend that you work with a dietitian to make a meal plan that is best for you. This information is not intended to replace advice given to you by your health care provider. Make sure you discuss any questions you have with your health care provider. Document Revised: 07/26/2019 Document  Reviewed: 07/26/2019 Elsevier Patient Education  [place].

## 2021-07-29 NOTE — Assessment & Plan Note (Signed)
Noted on exam today. He denies claudication symptoms. Will monitor for now, consider ABI for further evaluation.

## 2021-07-29 NOTE — Assessment & Plan Note (Signed)
Overall controlled since ablation on eliquis and Toprol XL '100mg'$  daily. Appreciate EP care.

## 2021-07-29 NOTE — Progress Notes (Signed)
Patient ID: Travis Howell, male    DOB: [birth year], 71 y.o.   MRN: [id number]  This visit was conducted in person.  BP 124/76   Pulse (!) 59   Temp 98.1 F (36.7 C) (Temporal)   Ht '5\' 7"'$  (1.702 m)   Wt 220 lb 2 oz (99.8 kg)   SpO2 94%   BMI 34.48 kg/m    CC: 6 mo DM f/u visit  Subjective:   HPI: Travis Howell is a 71 y.o. male presenting on 07/29/2021 for Diabetes (Here for 6 mo f/u.)   H/o kidney stones s/p extraction/stent placement ([place]).   Persistent afib s/p successful ablation 04/2021. Continues eliquis and toprol XL '100mg'$  once daily. Saw Dr Curt Bears yesterday.   Weight gain noted 10 lbs in the past 6 months. Has treadmill and total gym at home - hasn't been using.  DM - does not regularly check sugars, last checked 1 wk ago - 115. Compliant with antihyperglycemic regimen which includes: metformin '500mg'$  daily. Denies low sugars or hypoglycemic symptoms. Denies paresthesias, blurry vision. Last diabetic eye exam 09/2020. Glucometer brand: accu-chek. Last foot exam: 07/2020 - DUE. DSME: declines, will consider.  Lab Results  Component Value Date   HGBA1C 6.8 (A) 07/29/2021   Diabetic Foot Exam - Simple   Simple Foot Form Diabetic Foot exam was performed with the following findings: Yes 07/29/2021  9:22 AM  Visual Inspection See comments: Yes Sensation Testing Intact to touch and monofilament testing bilaterally: Yes Pulse Check See comments: Yes Comments Diminished pedal pulses bilaterally Thickened onychomycotic nails bilaterally    Lab Results  Component Value Date   MICROALBUR 3.1 (H) 01/22/2021         Relevant past medical, surgical, family and social history reviewed and updated as indicated. Interim medical history since our last visit reviewed. Allergies and medications reviewed and updated. Outpatient Medications Prior to Visit  Medication Sig Dispense Refill   albuterol (VENTOLIN HFA) 108 (90 Base) MCG/ACT inhaler USE 2 INHALATIONS BY  MOUTH  EVERY 6 HOURS AS NEEDED FOR WHEEZING OR SHORTNESS OF  BREATH 72 g 0   apixaban (ELIQUIS) 5 MG TABS tablet TAKE 1 TABLET BY MOUTH TWICE  DAILY 180 tablet 1   aspirin EC 81 MG tablet Take 81 mg by mouth daily. Swallow whole.     colchicine 0.6 MG tablet TAKE 1 TABLET BY MOUTH DAILY AS  NEEDED FOR GOUT FLARE 90 tablet 0   diphenhydrAMINE-APAP, sleep, (TYLENOL PM EXTRA STRENGTH PO) Take 2 tablets by mouth at bedtime as needed (pain).     ezetimibe (ZETIA) 10 MG tablet Take 1 tablet (10 mg total) by mouth daily. 90 tablet 3   metFORMIN (GLUCOPHAGE) 500 MG tablet Take 1 tablet (500 mg total) by mouth daily with breakfast. 90 tablet 3   metoprolol succinate (TOPROL-XL) 100 MG 24 hr tablet Take 1 tablet (100 mg total) by mouth at bedtime. Take with or immediately following a meal. 90 tablet 1   Multiple Vitamin (MULTIVITAMIN ADULT PO) Take 2 tablets by mouth daily.     naproxen sodium (ALEVE) 220 MG tablet Take 440 mg by mouth daily as needed (pain).     Omega-3 Fatty Acids (FISH OIL) 1000 MG CAPS Take 3,000 mg by mouth daily.     OVER THE COUNTER MEDICATION Place 4-5 drops into both ears daily as needed (ear wax). OTC ear drop for wax.     potassium chloride (KLOR-CON) 20 MEQ packet Take 10 mEq by  mouth daily.     rosuvastatin (CRESTOR) 10 MG tablet Take 1 tablet (10 mg total) by mouth daily. 90 tablet 3   silodosin (RAPAFLO) 8 MG CAPS capsule Take 1 capsule (8 mg total) by mouth daily with breakfast. 90 capsule 3   No facility-administered medications prior to visit.     Per HPI unless specifically indicated in ROS section below Review of Systems  Objective:  BP 124/76   Pulse (!) 59   Temp 98.1 F (36.7 C) (Temporal)   Ht '5\' 7"'$  (1.702 m)   Wt 220 lb 2 oz (99.8 kg)   SpO2 94%   BMI 34.48 kg/m   Wt Readings from Last 3 Encounters:  07/29/21 220 lb 2 oz (99.8 kg)  07/28/21 220 lb 9.6 oz (100.1 kg)  05/21/21 217 lb (98.4 kg)      Physical Exam Vitals and nursing note reviewed.   Constitutional:      Appearance: Normal appearance. He is not ill-appearing.  Eyes:     Extraocular Movements: Extraocular movements intact.     Conjunctiva/sclera: Conjunctivae normal.     Pupils: Pupils are equal, round, and reactive to light.  Cardiovascular:     Rate and Rhythm: Normal rate and regular rhythm.     Pulses: Normal pulses.     Heart sounds: Normal heart sounds. No murmur heard. Pulmonary:     Effort: Pulmonary effort is normal. No respiratory distress.     Breath sounds: Normal breath sounds. No wheezing, rhonchi or rales.  Musculoskeletal:     Right lower leg: No edema.     Left lower leg: No edema.     Comments: See HPI for foot exam if done  Skin:    General: Skin is warm and dry.     Findings: No rash.  Neurological:     Mental Status: He is alert.  Psychiatric:        Mood and Affect: Mood normal.        Behavior: Behavior normal.       Results for orders placed or performed in visit on 07/29/21  POCT glycosylated hemoglobin (Hb A1C)  Result Value Ref Range   Hemoglobin A1C 6.8 (A) 4.0 - 5.6 %   HbA1c POC (<> result, manual entry)     HbA1c, POC (prediabetic range)     HbA1c, POC (controlled diabetic range)      Assessment & Plan:   Problem List Items Addressed This Visit     Obesity, Class I, BMI 30-34.9    Discussed weight gain noted. Encouraged establishing regular exercise routine.       Controlled diabetes mellitus type 2 with complications (HCC) - Primary    Chronic, stable on current regimen.  Declines DSME referral at this time.  Watch added sugars/carbs in the diet.       Relevant Orders   POCT glycosylated hemoglobin (Hb A1C) (Completed)   Atrial fibrillation with RVR (HCC)    Overall controlled since ablation on eliquis and Toprol XL '100mg'$  daily. Appreciate EP care.       Diminished pulses in lower extremity    Noted on exam today. He denies claudication symptoms. Will monitor for now, consider ABI for further evaluation.          No orders of the defined types were placed in this encounter.  Orders Placed This Encounter  Procedures   POCT glycosylated hemoglobin (Hb A1C)     Patient instructions: Work on regular exercise routine. Start 10-15  min per day a few days a week. Goal (American Heart Association) is 150 minutes/week of moderate intensity aerobic exercise.  Call and schedule eye doctor for 09/2021.  Return in 6 months for wellness visit/physical.   Follow up plan: Return in about 6 months (around 01/29/2022), or if symptoms worsen or fail to improve, for annual exam, prior fasting for blood work, medicare wellness visit.  [name], MD

## 2021-07-29 NOTE — Assessment & Plan Note (Addendum)
Chronic, stable on current regimen.  Declines DSME referral at this time.  Watch added sugars/carbs in the diet.

## 2021-07-29 NOTE — Assessment & Plan Note (Signed)
Discussed weight gain noted. Encouraged establishing regular exercise routine.

## 2021-09-15 ENCOUNTER — Encounter: Payer: Self-pay | Admitting: Family Medicine

## 2021-09-15 LAB — HM DIABETES EYE EXAM

## 2021-12-20 ENCOUNTER — Other Ambulatory Visit: Payer: Self-pay | Admitting: Family Medicine

## 2021-12-20 ENCOUNTER — Other Ambulatory Visit: Payer: Self-pay | Admitting: Cardiology

## 2021-12-20 DIAGNOSIS — E118 Type 2 diabetes mellitus with unspecified complications: Secondary | ICD-10-CM

## 2021-12-21 ENCOUNTER — Other Ambulatory Visit: Payer: Self-pay | Admitting: Cardiology

## 2021-12-21 DIAGNOSIS — I4891 Unspecified atrial fibrillation: Secondary | ICD-10-CM

## 2021-12-21 DIAGNOSIS — Z5181 Encounter for therapeutic drug level monitoring: Secondary | ICD-10-CM

## 2021-12-22 NOTE — Telephone Encounter (Signed)
Prescription refill request for Eliquis received. Indication:afib Last office visit:7/23 Scr:0.8 Age: 71 Weight:99.8  kg  Prescription refilled

## 2022-01-03 ENCOUNTER — Other Ambulatory Visit: Payer: Self-pay | Admitting: Family Medicine

## 2022-01-03 DIAGNOSIS — E785 Hyperlipidemia, unspecified: Secondary | ICD-10-CM

## 2022-01-03 DIAGNOSIS — N400 Enlarged prostate without lower urinary tract symptoms: Secondary | ICD-10-CM

## 2022-01-26 ENCOUNTER — Ambulatory Visit (INDEPENDENT_AMBULATORY_CARE_PROVIDER_SITE_OTHER): Payer: Medicare Other

## 2022-01-26 VITALS — Ht 67.0 in | Wt 220.0 lb

## 2022-01-26 DIAGNOSIS — Z0000 Encounter for general adult medical examination without abnormal findings: Secondary | ICD-10-CM | POA: Diagnosis not present
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
## 2022-01-26 NOTE — Patient Instructions (Addendum)
Travis Howell , Thank you for taking time to come for your Medicare Wellness Visit. I appreciate your ongoing commitment to your health goals. Please review the following plan we discussed and let me know if I can assist you in the future.   These are the goals we discussed:  Goals Addressed               This Visit's Progress     Patient Stated     Increase physical activity (pt-stated)        Walk for exercise, as tolerated         This is a list of the screening recommended for you and due dates:  Health Maintenance  Topic Date Due   Yearly kidney health urinalysis for diabetes  01/22/2022   COVID-19 Vaccine (5 - 2023-24 season) 02/11/2022*   Hemoglobin A1C  01/29/2022   Yearly kidney function blood test for diabetes  04/11/2022   Complete foot exam   07/30/2022   Eye exam for diabetics  09/16/2022   Colon Cancer Screening  11/25/2022   Medicare Annual Wellness Visit  01/27/2023   DTaP/Tdap/Td vaccine (2 - Td or Tdap) 10/09/2026   Pneumonia Vaccine  Completed   Flu Shot  Completed   Hepatitis C Screening: USPSTF Recommendation to screen - Ages 70-79 yo.  Completed   Zoster (Shingles) Vaccine  Completed   HPV Vaccine  Aged Out  *Topic was postponed. The date shown is not the original due date.    Advanced directives: on file  Conditions/risks identified: none new  Next appointment: Follow up in one year for your annual wellness visit.   Preventive Care 71 Years and Older, Male  Preventive care refers to lifestyle choices and visits with your health care provider that can promote health and wellness. What does preventive care include? A yearly physical exam. This is also called an annual well check. Dental exams once or twice a year. Routine eye exams. Ask your health care provider how often you should have your eyes checked. Personal lifestyle choices, including: Daily care of your teeth and gums. Regular physical activity. Eating a healthy diet. Avoiding  tobacco and drug use. Limiting alcohol use. Practicing safe sex. Taking low doses of aspirin every day. Taking vitamin and mineral supplements as recommended by your health care provider. What happens during an annual well check? The services and screenings done by your health care provider during your annual well check will depend on your age, overall health, lifestyle risk factors, and family history of disease. Counseling  Your health care provider may ask you questions about your: Alcohol use. Tobacco use. Drug use. Emotional well-being. Home and relationship well-being. Sexual activity. Eating habits. History of falls. Memory and ability to understand (cognition). Work and work Statistician. Screening  You may have the following tests or measurements: Height, weight, and BMI. Blood pressure. Lipid and cholesterol levels. These may be checked every 5 years, or more frequently if you are over 18 years old. Skin check. Lung cancer screening. You may have this screening every year starting at age 19 if you have a 30-pack-year history of smoking and currently smoke or have quit within the past 15 years. Fecal occult blood test (FOBT) of the stool. You may have this test every year starting at age 75. Flexible sigmoidoscopy or colonoscopy. You may have a sigmoidoscopy every 5 years or a colonoscopy every 10 years starting at age 31. Prostate cancer screening. Recommendations will vary depending on your family  history and other risks. Hepatitis C blood test. Hepatitis B blood test. Sexually transmitted disease (STD) testing. Diabetes screening. This is done by checking your blood sugar (glucose) after you have not eaten for a while (fasting). You may have this done every 1-3 years. Abdominal aortic aneurysm (AAA) screening. You may need this if you are a current or former smoker. Osteoporosis. You may be screened starting at age 41 if you are at high risk. Talk with your health care  provider about your test results, treatment options, and if necessary, the need for more tests. Vaccines  Your health care provider may recommend certain vaccines, such as: Influenza vaccine. This is recommended every year. Tetanus, diphtheria, and acellular pertussis (Tdap, Td) vaccine. You may need a Td booster every 10 years. Zoster vaccine. You may need this after age 71. Pneumococcal 13-valent conjugate (PCV13) vaccine. One dose is recommended after age 41. Pneumococcal polysaccharide (PPSV23) vaccine. One dose is recommended after age 13. Talk to your health care provider about which screenings and vaccines you need and how often you need them. This information is not intended to replace advice given to you by your health care provider. Make sure you discuss any questions you have with your health care provider. Document Released: 01/18/2015 Document Revised: 09/11/2015 Document Reviewed: 10/23/2014 Elsevier Interactive Patient Education  2017 [employer] Prevention in the Home Falls can cause injuries. They can happen to people of all ages. There are many things you can do to make your home safe and to help prevent falls. What can I do on the outside of my home? Regularly fix the edges of walkways and driveways and fix any cracks. Remove anything that might make you trip as you walk through a door, such as a raised step or threshold. Trim any bushes or trees on the path to your home. Use bright outdoor lighting. Clear any walking paths of anything that might make someone trip, such as rocks or tools. Regularly check to see if handrails are loose or broken. Make sure that both sides of any steps have handrails. Any raised decks and porches should have guardrails on the edges. Have any leaves, snow, or ice cleared regularly. Use sand or salt on walking paths during winter. Clean up any spills in your garage right away. This includes oil or grease spills. What can I do in the  bathroom? Use night lights. Install grab bars by the toilet and in the tub and shower. Do not use towel bars as grab bars. Use non-skid mats or decals in the tub or shower. If you need to sit down in the shower, use a plastic, non-slip stool. Keep the floor dry. Clean up any water that spills on the floor as soon as it happens. Remove soap buildup in the tub or shower regularly. Attach bath mats securely with double-sided non-slip rug tape. Do not have throw rugs and other things on the floor that can make you trip. What can I do in the bedroom? Use night lights. Make sure that you have a light by your bed that is easy to reach. Do not use any sheets or blankets that are too big for your bed. They should not hang down onto the floor. Have a firm chair that has side arms. You can use this for support while you get dressed. Do not have throw rugs and other things on the floor that can make you trip. What can I do in the kitchen? Clean up any  spills right away. Avoid walking on wet floors. Keep items that you use a lot in easy-to-reach places. If you need to reach something above you, use a strong step stool that has a grab bar. Keep electrical cords out of the way. Do not use floor polish or wax that makes floors slippery. If you must use wax, use non-skid floor wax. Do not have throw rugs and other things on the floor that can make you trip. What can I do with my stairs? Do not leave any items on the stairs. Make sure that there are handrails on both sides of the stairs and use them. Fix handrails that are broken or loose. Make sure that handrails are as long as the stairways. Check any carpeting to make sure that it is firmly attached to the stairs. Fix any carpet that is loose or worn. Avoid having throw rugs at the top or bottom of the stairs. If you do have throw rugs, attach them to the floor with carpet tape. Make sure that you have a light switch at the top of the stairs and the  bottom of the stairs. If you do not have them, ask someone to add them for you. What else can I do to help prevent falls? Wear shoes that: Do not have high heels. Have rubber bottoms. Are comfortable and fit you well. Are closed at the toe. Do not wear sandals. If you use a stepladder: Make sure that it is fully opened. Do not climb a closed stepladder. Make sure that both sides of the stepladder are locked into place. Ask someone to hold it for you, if possible. Clearly mark and make sure that you can see: Any grab bars or handrails. First and last steps. Where the edge of each step is. Use tools that help you move around (mobility aids) if they are needed. These include: Canes. Walkers. Scooters. Crutches. Turn on the lights when you go into a dark area. Replace any light bulbs as soon as they burn out. Set up your furniture so you have a clear path. Avoid moving your furniture around. If any of your floors are uneven, fix them. If there are any pets around you, be aware of where they are. Review your medicines with your doctor. Some medicines can make you feel dizzy. This can increase your chance of falling. Ask your doctor what other things that you can do to help prevent falls. This information is not intended to replace advice given to you by your health care provider. Make sure you discuss any questions you have with your health care provider. Document Released: 10/18/2008 Document Revised: 05/30/2015 Document Reviewed: 01/26/2014 Elsevier Interactive Patient Education  2017 [employer].

## 2022-01-28 ENCOUNTER — Ambulatory Visit (HOSPITAL_COMMUNITY)
Admission: RE | Admit: 2022-01-28 | Discharge: 2022-01-28 | Disposition: A | Discharge status: Home / Self Care | Payer: Medicare Other | Source: Ambulatory Visit | Attending: Physician Assistant | Admitting: Physician Assistant

## 2022-01-28 ENCOUNTER — Encounter (HOSPITAL_COMMUNITY): Payer: Self-pay | Admitting: Physician Assistant

## 2022-01-28 VITALS — BP 120/78 | HR 57 | Ht 67.0 in | Wt 226.0 lb

## 2022-01-28 DIAGNOSIS — E119 Type 2 diabetes mellitus without complications: Secondary | ICD-10-CM | POA: Diagnosis not present

## 2022-01-28 DIAGNOSIS — Z6835 Body mass index (BMI) 35.0-35.9, adult: Secondary | ICD-10-CM | POA: Diagnosis not present

## 2022-01-28 DIAGNOSIS — E669 Obesity, unspecified: Secondary | ICD-10-CM | POA: Insufficient documentation

## 2022-01-28 DIAGNOSIS — I4892 Unspecified atrial flutter: Secondary | ICD-10-CM | POA: Insufficient documentation

## 2022-01-28 DIAGNOSIS — Z7901 Long term (current) use of anticoagulants: Secondary | ICD-10-CM | POA: Diagnosis not present

## 2022-01-28 DIAGNOSIS — I2510 Atherosclerotic heart disease of native coronary artery without angina pectoris: Secondary | ICD-10-CM | POA: Diagnosis not present

## 2022-01-28 DIAGNOSIS — E785 Hyperlipidemia, unspecified: Secondary | ICD-10-CM | POA: Diagnosis not present

## 2022-01-28 DIAGNOSIS — I4819 Other persistent atrial fibrillation: Secondary | ICD-10-CM | POA: Insufficient documentation

## 2022-01-28 DIAGNOSIS — D6869 Other thrombophilia: Secondary | ICD-10-CM | POA: Insufficient documentation

## 2022-01-28 DIAGNOSIS — I10 Essential (primary) hypertension: Secondary | ICD-10-CM | POA: Diagnosis not present
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
## 2022-01-30 ENCOUNTER — Telehealth: Payer: Self-pay | Admitting: *Deleted

## 2022-01-30 NOTE — Telephone Encounter (Signed)
Contacted regarding PREP Class referral. Interested in participating at the [facility]. I informed the patient there would be a wait for available classes into Spring 2024 at that location. We will call back with availability.

## 2022-02-02 ENCOUNTER — Encounter: Payer: Self-pay | Admitting: Family Medicine

## 2022-02-02 ENCOUNTER — Ambulatory Visit (INDEPENDENT_AMBULATORY_CARE_PROVIDER_SITE_OTHER): Payer: Medicare Other | Admitting: Family Medicine

## 2022-02-02 VITALS — BP 144/82 | HR 67 | Temp 97.1°F | Ht 66.5 in | Wt 224.2 lb

## 2022-02-02 DIAGNOSIS — E118 Type 2 diabetes mellitus with unspecified complications: Secondary | ICD-10-CM

## 2022-02-02 DIAGNOSIS — I4891 Unspecified atrial fibrillation: Secondary | ICD-10-CM | POA: Diagnosis not present

## 2022-02-02 DIAGNOSIS — Z0000 Encounter for general adult medical examination without abnormal findings: Secondary | ICD-10-CM

## 2022-02-02 DIAGNOSIS — E1169 Type 2 diabetes mellitus with other specified complication: Secondary | ICD-10-CM | POA: Diagnosis not present

## 2022-02-02 DIAGNOSIS — M109 Gout, unspecified: Secondary | ICD-10-CM | POA: Diagnosis not present

## 2022-02-02 DIAGNOSIS — E785 Hyperlipidemia, unspecified: Secondary | ICD-10-CM | POA: Diagnosis not present

## 2022-02-02 DIAGNOSIS — Z7189 Other specified counseling: Secondary | ICD-10-CM

## 2022-02-02 DIAGNOSIS — I2510 Atherosclerotic heart disease of native coronary artery without angina pectoris: Secondary | ICD-10-CM

## 2022-02-02 DIAGNOSIS — I10 Essential (primary) hypertension: Secondary | ICD-10-CM

## 2022-02-02 DIAGNOSIS — L03011 Cellulitis of right finger: Secondary | ICD-10-CM | POA: Insufficient documentation

## 2022-02-02 DIAGNOSIS — N400 Enlarged prostate without lower urinary tract symptoms: Secondary | ICD-10-CM

## 2022-02-02 HISTORY — DX: Cellulitis of right finger: L03.011

## 2022-02-02 LAB — COMPREHENSIVE METABOLIC PANEL
ALT: 13 U/L (ref 0–53)
AST: 19 U/L (ref 0–37)
Albumin: 4.6 g/dL (ref 3.5–5.2)
Alkaline Phosphatase: 62 U/L (ref 39–117)
BUN: 11 mg/dL (ref 6–23)
CO2: 31 mEq/L (ref 19–32)
Calcium: 9.1 mg/dL (ref 8.4–10.5)
Chloride: 101 mEq/L (ref 96–112)
Creatinine, Ser: 0.74 mg/dL (ref 0.40–1.50)
GFR: 91.91 mL/min (ref >60.00)
Glucose, Bld: 138 mg/dL — ABNORMAL HIGH (ref 70–99)
Potassium: 4.3 mEq/L (ref 3.5–5.1)
Sodium: 141 mEq/L (ref 135–145)
Total Bilirubin: 0.6 mg/dL (ref 0.2–1.2)
Total Protein: 7.3 g/dL (ref 6.0–8.3)

## 2022-02-02 LAB — CBC WITH DIFFERENTIAL/PLATELET
Basophils Absolute: 0 10*3/uL (ref 0.0–0.1)
Basophils Relative: 0.5 % (ref 0.0–3.0)
Eosinophils Absolute: 0.2 10*3/uL (ref 0.0–0.7)
Eosinophils Relative: 3.3 % (ref 0.0–5.0)
HCT: 39.3 % (ref 39.0–52.0)
Hemoglobin: 14 g/dL (ref 13.0–17.0)
Lymphocytes Relative: 19.4 % (ref 12.0–46.0)
Lymphs Abs: 1.1 10*3/uL (ref 0.7–4.0)
MCHC: 35.6 g/dL (ref 30.0–36.0)
MCV: 96.8 fl (ref 78.0–100.0)
Monocytes Absolute: 0.4 10*3/uL (ref 0.1–1.0)
Monocytes Relative: 6.4 % (ref 3.0–12.0)
Neutro Abs: 4 10*3/uL (ref 1.4–7.7)
Neutrophils Relative %: 70.4 % (ref 43.0–77.0)
Platelets: 177 10*3/uL (ref 150.0–400.0)
RBC: 4.06 Mil/uL — ABNORMAL LOW (ref 4.22–5.81)
RDW: 12.9 % (ref 11.5–15.5)
WBC: 5.7 10*3/uL (ref 4.0–10.5)

## 2022-02-02 LAB — PSA: PSA: 0.23 ng/mL (ref 0.10–4.00)

## 2022-02-02 LAB — LIPID PANEL
Cholesterol: 122 mg/dL (ref 0–200)
HDL: 40.2 mg/dL (ref >39.00)
LDL Cholesterol: 68 mg/dL (ref 0–99)
NonHDL: 82.17
Total CHOL/HDL Ratio: 3
Triglycerides: 73 mg/dL (ref 0.0–149.0)
VLDL: 14.6 mg/dL (ref 0.0–40.0)

## 2022-02-02 LAB — HEMOGLOBIN A1C: Hgb A1c MFr Bld: 7 % — ABNORMAL HIGH (ref 4.6–6.5)

## 2022-02-02 LAB — MICROALBUMIN / CREATININE URINE RATIO
Creatinine,U: 155.9 mg/dL
Microalb Creat Ratio: 1.5 mg/g (ref 0.0–30.0)
Microalb, Ur: 2.4 mg/dL — ABNORMAL HIGH (ref 0.0–1.9)

## 2022-02-02 LAB — URIC ACID: Uric Acid, Serum: 5.9 mg/dL (ref 4.0–7.8)

## 2022-02-02 MED ORDER — EZETIMIBE 10 MG PO TABS: 10.0000 mg | ORAL_TABLET | Freq: Every day | ORAL | 4 refills | Status: DC

## 2022-02-02 MED ORDER — SILODOSIN 8 MG PO CAPS: 8.0000 mg | ORAL_CAPSULE | Freq: Every day | ORAL | 4 refills | Status: DC

## 2022-02-02 MED ORDER — METFORMIN HCL 500 MG PO TABS: 500.0000 mg | ORAL_TABLET | Freq: Every day | ORAL | 4 refills | Status: DC

## 2022-02-02 MED ORDER — ROSUVASTATIN CALCIUM 10 MG PO TABS: 10.0000 mg | ORAL_TABLET | Freq: Every day | ORAL | 4 refills | Status: DC

## 2022-02-02 NOTE — Assessment & Plan Note (Signed)
Preventative protocols reviewed and updated unless pt declined. Discussed healthy diet and lifestyle.  

## 2022-02-02 NOTE — Assessment & Plan Note (Addendum)
Update CBC, continues eliquis. Appreciate afib clinic care.

## 2022-02-02 NOTE — Assessment & Plan Note (Addendum)
Chronic, update A1c on low dose metformin.  RTC 6 mo DM f/u visit.

## 2022-02-02 NOTE — Progress Notes (Addendum)
Patient ID: Travis Howell, male    DOB: 10/09/1950, 72 y.o.   MRN: [id number]  This visit was conducted in person.  BP (!) 144/82   Pulse 67   Temp (!) 97.1 F (36.2 C) (Temporal)   Ht 5' 6.5" (1.689 m)   Wt 224 lb 4 oz (101.7 kg)   SpO2 95%   BMI 35.65 kg/m   BP Readings from Last 3 Encounters:  02/02/22 (!) 144/82  01/28/22 120/78  07/29/21 124/76  154/86 on repeat testing  CC: CPE Subjective:   HPI: Travis Howell is a 72 y.o. male presenting on 02/02/2022 for Annual Exam (MCR prt 2 [AWV- 01/26/22]. C/o pain, swelling and redness in tip of R 5th fingernail. States he pulled cuticle off about 5 days ago. )   Saw health advisor last week for medicare wellness visit. Note reviewed.   No results found.  [place] [facility] Visit from 02/02/2022 in [place] at [place]  PHQ-2 Total Score 0          02/02/2022   10:23 AM 01/26/2022    8:28 AM 01/21/2022    1:22 PM 01/23/2021    9:51 AM 01/23/2020    9:45 AM  Fall Risk   Falls in the past year? 0 0 0 0 0  Number falls in past yr:  0 0 0 0  Injury with Fall?  0 0 0 0  Risk for fall due to :  History of fall(s)  No Fall Risks No Fall Risks  Follow up  Falls prevention discussed;Falls evaluation completed  Falls prevention discussed Falls evaluation completed;Falls prevention discussed      01/26/2022    8:34 AM  6CIT Screen  What Year? 0 points  What month? 0 points  What time? 0 points  Count back from 20 0 points  Months in reverse 0 points  Repeat phrase 0 points  Total Score 0 points  Reassuring cognitive assessment with health advisor. However he feels he forgets things frequently. Wife doesn't note significant problem.   Pulled hang hail to tip of 5th R finger 1 week ago - it got infected, he's been treating with peroxide and triple abx ointment. No fever. Has had fever blisters previously. No initial paresthesias, finger was painful initially but it did improve after it drained  some.   R kidney stone s/p extraction with stent placement through urology last year ([place]).  Persistent atrial fibrillation - followed by EP on eliquis, toprol XL '75mg'$  daily s/p ablation 2023. Saw last week, has been referred to [facility] course (12 wk program).  Known CAD s/p DES to LAD 2012.    Preventative: COLONOSCOPY 11/2017 - 1 TA, diverticulosis, rpt 5 yrs ([place]) Prostate cancer screening - on rapaflo for BPH. voids well. Nocturia x1. No urinary symptoms.  Lung cancer screening - quit smoking 2011, not eligible due to low PY hx.  Flu shot yearly  [place] 02/2019 x2, booster 10/2019, 05/2020  Prevnar-13 2019, pneumovax 10/2018 Tdap 2018 RSV - discussed, declines zostavax - 2014 Shingrix - 07/2020, 10/2020 Advanced directives received, reviewed 10/2018. HCPOA are Merleen Nicely then Charleston Poot (deceased sister). Ok with life support if able to recover within 3-6 months.  Seat belt use discussed.  Sunscreen use discussed. No changing moles on skin.  Ex smoker - quit 2011. <20 PY hx. Wife continues smoking.  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 Interim medical history since our last visit reviewed. Allergies and medications reviewed and updated. Outpatient Medications Prior to Visit  Medication Sig Dispense Refill   albuterol (VENTOLIN HFA) 108 (90 Base) MCG/ACT inhaler USE 2 INHALATIONS BY MOUTH  EVERY 6 HOURS AS NEEDED FOR WHEEZING OR SHORTNESS OF  BREATH 72 g 0   aspirin EC 81 MG tablet Take 81 mg by mouth daily. Swallow whole.     colchicine 0.6 MG tablet TAKE 1 TABLET BY MOUTH DAILY AS  NEEDED FOR  GOUT FLARE 90 tablet 0   diphenhydrAMINE-APAP, sleep, (TYLENOL PM EXTRA STRENGTH PO) Take 2 tablets by mouth at bedtime as needed (pain).     ELIQUIS 5 MG TABS tablet TAKE 1 TABLET BY MOUTH TWICE  DAILY 180 tablet 3   metoprolol succinate (TOPROL-XL) 100 MG 24 hr tablet TAKE 1 TABLET BY MOUTH AT  BEDTIME 90 tablet 1   Multiple Vitamin (MULTIVITAMIN ADULT PO) Take 2 tablets by mouth daily.     naproxen sodium (ALEVE) 220 MG tablet Take 440 mg by mouth daily as needed (pain).     Omega-3 Fatty Acids (FISH OIL) 1000 MG CAPS Take 3,000 mg by mouth daily.     OVER THE COUNTER MEDICATION Place 4-5 drops into both ears daily as needed (ear wax). OTC ear drop for wax.     potassium chloride (KLOR-CON) 20 MEQ packet Take 10 mEq by mouth daily.     ezetimibe (ZETIA) 10 MG tablet TAKE 1 TABLET BY MOUTH DAILY 90 tablet 0   metFORMIN (GLUCOPHAGE) 500 MG tablet TAKE 1 TABLET BY MOUTH DAILY  WITH BREAKFAST 90 tablet 0   rosuvastatin (CRESTOR) 10 MG tablet TAKE 1 TABLET BY MOUTH DAILY 90 tablet 0   silodosin (RAPAFLO) 8 MG CAPS capsule TAKE 1 CAPSULE BY MOUTH DAILY  WITH BREAKFAST 90 capsule 0   No facility-administered medications prior to visit.     Per HPI unless specifically indicated in ROS section below Review of Systems  Constitutional:  Negative for activity change, appetite change, chills, fatigue, fever and unexpected weight change.  HENT:  Negative for hearing loss.   Eyes:  Negative for visual disturbance.  Respiratory:  Negative for cough, chest tightness, shortness of breath and wheezing.   Cardiovascular:  Negative for chest pain, palpitations and leg swelling.  Gastrointestinal:  Negative for abdominal distention, abdominal pain, blood in stool, constipation, diarrhea, nausea and vomiting.  Genitourinary:  Negative for difficulty urinating and hematuria.  Musculoskeletal:  Negative for arthralgias, myalgias and neck pain.  Skin:  Negative for rash.  Neurological:  Negative for dizziness,  seizures, syncope and headaches.  Hematological:  Negative for adenopathy. Does not bruise/bleed easily.  Psychiatric/Behavioral:  Negative for dysphoric mood. The patient is not nervous/anxious.     Objective:  BP (!) 144/82   Pulse 67   Temp (!) 97.1 F (36.2 C) (Temporal)   Ht 5' 6.5" (1.689 m)   Wt 224 lb 4 oz (101.7 kg)   SpO2 95%   BMI 35.65 kg/m   Wt Readings from Last 3 Encounters:  02/02/22 224 lb 4 oz (101.7 kg)  01/28/22 226 lb (102.5 kg)  01/26/22 220 lb (  99.8 kg)      Physical Exam Vitals and nursing note reviewed.  Constitutional:      General: He is not in acute distress.    Appearance: Normal appearance. He is well-developed. He is not ill-appearing.  HENT:     Head: Normocephalic and atraumatic.     Right Ear: Hearing, tympanic membrane, ear canal and external ear normal.     Left Ear: Hearing, tympanic membrane, ear canal and external ear normal.     Nose: Nose normal.     Mouth/Throat:     Mouth: Mucous membranes are moist.     Pharynx: Oropharynx is clear. No oropharyngeal exudate or posterior oropharyngeal erythema.  Eyes:     General: No scleral icterus.    Extraocular Movements: Extraocular movements intact.     Conjunctiva/sclera: Conjunctivae normal.     Pupils: Pupils are equal, round, and reactive to light.  Neck:     Thyroid: No thyroid mass or thyromegaly.     Vascular: No carotid bruit.  Cardiovascular:     Rate and Rhythm: Normal rate and regular rhythm.     Pulses: Normal pulses.          Radial pulses are 2+ on the right side and 2+ on the left side.     Heart sounds: Normal heart sounds. No murmur heard. Pulmonary:     Effort: Pulmonary effort is normal. No respiratory distress.     Breath sounds: Normal breath sounds. No wheezing, rhonchi or rales.  Abdominal:     General: Bowel sounds are normal. There is no distension.     Palpations: Abdomen is soft. There is no mass.     Tenderness: There is no abdominal tenderness. There is  no guarding or rebound.     Hernia: No hernia is present.  Musculoskeletal:        General: Normal range of motion.     Cervical back: Normal range of motion and neck supple.     Right lower leg: No edema.     Left lower leg: No edema.     Comments:  2+ rad pulses bilaterally Abscess to pulp of right 5th digit with swollen granulation tissue present to lateral nailfold   Lymphadenopathy:     Cervical: No cervical adenopathy.  Skin:    General: Skin is warm and dry.     Findings: No rash.  Neurological:     General: No focal deficit present.     Mental Status: He is alert and oriented to person, place, and time.  Psychiatric:        Mood and Affect: Mood normal.        Behavior: Behavior normal.        Thought Content: Thought content normal.        Judgment: Judgment normal.       Results for orders placed or performed in visit on 09/15/21  HM DIABETES EYE EXAM  Result Value Ref Range   HM Diabetic Eye Exam No Retinopathy No Retinopathy    Assessment & Plan:   Problem List Items Addressed This Visit     Advanced care planning/counseling discussion (Chronic)    Previously discussed      Health maintenance examination - Primary (Chronic)    Preventative protocols reviewed and updated unless pt declined. Discussed healthy diet and lifestyle.       CAD (coronary artery disease)    Sees cardiology, on zetia, crestor, aspirin, eliquis.  Relevant Medications   ezetimibe (ZETIA) 10 MG tablet   rosuvastatin (CRESTOR) 10 MG tablet   HTN (hypertension)    Chronic, BP above goal - advised start monitoring BP at home and let me or cardiology know if consistently >140/90 for antihypertensive titration.       Relevant Medications   ezetimibe (ZETIA) 10 MG tablet   rosuvastatin (CRESTOR) 10 MG tablet   Hyperlipidemia associated with type 2 diabetes mellitus (HCC)    Chronic, update FLP on statin and zetia. The ASCVD Risk score (Arnett DK, et al., 2019) failed to  calculate for the following reasons:   The patient has a prior MI or stroke diagnosis       Relevant Medications   ezetimibe (ZETIA) 10 MG tablet   metFORMIN (GLUCOPHAGE) 500 MG tablet   rosuvastatin (CRESTOR) 10 MG tablet   Other Relevant Orders   Lipid panel   Comprehensive metabolic panel   BPH (benign prostatic hyperplasia)    Update PSA. Stable period on rapaflo.       Relevant Medications   silodosin (RAPAFLO) 8 MG CAPS capsule   Other Relevant Orders   PSA   Severe obesity (BMI 35.0-39.9) with comorbidity ([place])    Encouraged healthy diet and lifestyle choices to affect sustainable weight loss.  Obesity is complicated by comorbidities of afib, hypertension, diabetes, hyperlipidemia, and osteoarthritis.       Relevant Medications   metFORMIN (GLUCOPHAGE) 500 MG tablet   Controlled diabetes mellitus type 2 with complications (HCC)    Chronic, update A1c on low dose metformin.  RTC 6 mo DM f/u visit.       Relevant Medications   metFORMIN (GLUCOPHAGE) 500 MG tablet   rosuvastatin (CRESTOR) 10 MG tablet   Other Relevant Orders   Hemoglobin A1c   Microalbumin / creatinine urine ratio   Podagra    Update urate. No recent gout flare      Relevant Orders   Uric acid   Atrial fibrillation with RVR (HCC)    Update CBC, continues eliquis. Appreciate afib clinic care.       Relevant Medications   ezetimibe (ZETIA) 10 MG tablet   rosuvastatin (CRESTOR) 10 MG tablet   Other Relevant Orders   CBC with Differential/Platelet   Felon of finger of right hand    Acute issue over the past 1 week - anticipate has developed felon after acute paronychia. States this did drain at home and felt better afterwards however evidence of ongoing infection with residual abscess and now swollen granulation tissue to lateral nailfold anticipate poor healing. Will urgently refer to hand surgery for further evaluation/management. I did not start antibiotic at this time. Not consistent with  herpetic whitlow.       Relevant Orders   Ambulatory referral to Hand Surgery     Meds ordered this encounter  Medications   ezetimibe (ZETIA) 10 MG tablet    Sig: Take 1 tablet (10 mg total) by mouth daily.    Dispense:  90 tablet    Refill:  4   metFORMIN (GLUCOPHAGE) 500 MG tablet    Sig: Take 1 tablet (500 mg total) by mouth daily with breakfast.    Dispense:  90 tablet    Refill:  4   rosuvastatin (CRESTOR) 10 MG tablet    Sig: Take 1 tablet (10 mg total) by mouth daily.    Dispense:  90 tablet    Refill:  4   silodosin (RAPAFLO) 8 MG CAPS capsule  Sig: Take 1 capsule (8 mg total) by mouth daily with breakfast.    Dispense:  90 capsule    Refill:  4    Orders Placed This Encounter  Procedures   Hemoglobin A1c   Microalbumin / creatinine urine ratio   Lipid panel   Comprehensive metabolic panel   PSA   CBC with Differential/Platelet   Uric acid   Ambulatory referral to Hand Surgery    Referral Priority:   Urgent    Referral Type:   Surgical    Referral Reason:   Specialty Services Required    Requested Specialty:   Hand Surgery    Number of Visits Requested:   1    Patient Instructions  Labs today For finger - continue warm water soaks in epsom salt, antibiotic ointment to granulation tissue, I will refer you to hand surgeon for further evaluation.  BP is staying elevated - start monitoring more closely at home and let me know if consistently >140/90.  Good to see you today Return in 6 months for follow up visit.   Follow up plan: Return in about 6 months (around 08/03/2022) for follow up visit.  [name], MD

## 2022-02-02 NOTE — Assessment & Plan Note (Signed)
Update PSA. Stable period on rapaflo.

## 2022-02-02 NOTE — Assessment & Plan Note (Signed)
Sees cardiology, on zetia, crestor, aspirin, eliquis.

## 2022-02-02 NOTE — Addendum Note (Signed)
Addended by: [name] on: 02/02/2022 11:43 AM   Modules accepted: Orders

## 2022-02-02 NOTE — Assessment & Plan Note (Addendum)
Acute issue over the past 1 week - anticipate has developed felon after acute paronychia. States this did drain at home and felt better afterwards however evidence of ongoing infection with residual abscess and now swollen granulation tissue to lateral nailfold anticipate poor healing. Will urgently refer to hand surgery for further evaluation/management. I did not start antibiotic at this time. Not consistent with herpetic whitlow.

## 2022-02-02 NOTE — Assessment & Plan Note (Signed)
Update urate. No recent gout flare

## 2022-02-02 NOTE — Assessment & Plan Note (Addendum)
Encouraged healthy diet and lifestyle choices to affect sustainable weight loss.  Obesity is complicated by comorbidities of afib, hypertension, diabetes, hyperlipidemia, and osteoarthritis.

## 2022-02-02 NOTE — Assessment & Plan Note (Addendum)
Chronic, BP above goal - advised start monitoring BP at home and let me or cardiology know if consistently >140/90 for antihypertensive titration.

## 2022-02-02 NOTE — Assessment & Plan Note (Signed)
Previously discussed.

## 2022-02-02 NOTE — Patient Instructions (Addendum)
Labs today For finger - continue warm water soaks in epsom salt, antibiotic ointment to granulation tissue, I will refer you to hand surgeon for further evaluation.  BP is staying elevated - start monitoring more closely at home and let me know if consistently >140/90.  Good to see you today Return in 6 months for follow up visit.

## 2022-02-02 NOTE — Assessment & Plan Note (Signed)
Chronic, update FLP on statin and zetia. The ASCVD Risk score (Arnett DK, et al., 2019) failed to calculate for the following reasons:   The patient has a prior MI or stroke diagnosis

## 2022-02-05 ENCOUNTER — Encounter: Payer: Self-pay | Admitting: Orthopaedic Surgery

## 2022-02-05 ENCOUNTER — Ambulatory Visit (INDEPENDENT_AMBULATORY_CARE_PROVIDER_SITE_OTHER): Payer: Medicare Other | Admitting: Orthopaedic Surgery

## 2022-02-05 DIAGNOSIS — L03011 Cellulitis of right finger: Secondary | ICD-10-CM

## 2022-02-05 MED ORDER — AMOXICILLIN-POT CLAVULANATE 875-125 MG PO TABS: 1.0000 | ORAL_TABLET | Freq: Two times a day (BID) | ORAL | 0 refills | Status: DC
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
## 2022-02-11 ENCOUNTER — Ambulatory Visit: Payer: Medicare Other | Admitting: Physician Assistant

## 2022-02-11 ENCOUNTER — Encounter: Payer: Self-pay | Admitting: Physician Assistant

## 2022-02-11 DIAGNOSIS — L03011 Cellulitis of right finger: Secondary | ICD-10-CM
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
## 2022-02-18 ENCOUNTER — Telehealth: Payer: Self-pay

## 2022-02-18 DIAGNOSIS — E785 Hyperlipidemia, unspecified: Secondary | ICD-10-CM

## 2022-02-18 NOTE — Telephone Encounter (Signed)
   Pre-operative Risk Assessment    Patient Name: Travis Howell  DOB: 1950/07/24 MRN: [id number]      Request for Surgical Clearance    Procedure:  Left Knee Arthroplasty   Date of Surgery:  Clearance TBD                                 Surgeon:  Kathalene Frames. Mayer Camel, MD Surgeon's Group or Practice Name:  [facility]  Phone number:  [phone] [facility] Fax number:  [phone]   Type of Clearance Requested:   - Medical    Type of Anesthesia:  Spinal   Additional requests/questions:   N/A  SignedJamelle Haring   02/18/2022, 7:49 AM

## 2022-02-18 NOTE — Telephone Encounter (Signed)
   Name: Travis Howell  DOB: 02/24/1950  MRN: [id number]  Primary Cardiologist: Larae Grooms, MD  Chart reviewed as part of pre-operative protocol coverage. Because of [name]'s past medical history and time since last visit, he will require a follow-up in-office visit in order to better assess preoperative cardiovascular risk.  Pre-op covering staff: - Please schedule appointment and call patient to inform them. If patient already had an upcoming appointment within acceptable timeframe, please add "pre-op clearance" to the appointment notes so provider is aware. - Please contact requesting surgeon's office via preferred method (i.e, phone, fax) to inform them of need for appointment prior to surgery.    Mable Fill, Marissa Nestle, NP  02/18/2022, 8:02 AM

## 2022-02-18 NOTE — Telephone Encounter (Signed)
Received faxed surgical clearance form from [facility]. Pt needs L knee arthroplasty. Surgery date, TBD.   Spoke with pt notifying him of info above. Pt scheduled pre-op eval on 02/23/22 at 11:00.   [Form is in basket on [employer].]

## 2022-02-18 NOTE — Telephone Encounter (Signed)
Patient agreeable with in office appointment.

## 2022-02-19 ENCOUNTER — Telehealth: Payer: Self-pay | Admitting: Family Medicine

## 2022-02-19 NOTE — Telephone Encounter (Signed)
Patient has visit with you on 2/19. It it ok to wait until then or so you want seen sooner?   Patient Name: Travis Howell Gender: Male DOB: 1950/10/04 Age: 72 Y 40 M 23 D Return Phone Number: [phone] (Primary), [phone] (Secondary) Address[place]  [address] Client [facility] - Client Client Site [facility] Provider [name] - MD Contact Type Call Who Is Calling Patient / Member / Family / Caregiver Call Type Triage / Clinical Caller Name Merleen Nicely Relationship To Patient Partner Return Phone Number [phone] (Primary) Chief Complaint BLOOD SUGAR HIGH - 500 or greater Reason for Call Symptomatic / Request for [place] states her boyfriend glucose monitor is reading just high, providing no numbers but when she check her glucose it gives a number. Translation No Nurse Assessment Nurse: Lavina Hamman, RN, Thomasena Edis Date/Time (Eastern Time): 02/19/2022 3:44:36 PM Confirm and document reason for call. If symptomatic, describe symptoms. ---Caller states Pt's blood sugar was reading high. States now it is 114 Does the patient have any new or worsening symptoms? ---Yes Will a triage be completed? ---Yes Related visit to physician within the last 2 weeks? ---No Does the PT have any chronic conditions? (i.e. diabetes, asthma, this includes High risk factors for pregnancy, etc.) ---Yes List chronic conditions. ---Diabetes Is this a behavioral health or substance abuse call? ---No Guidelines Guideline Title Affirmed Question Affirmed Notes Nurse Date/Time (Eastern Time) Diabetes - High Blood Sugar Blood glucose 70-240 mg/dL (3.9 -13.3 mmol/L) Lavina Hamman, RN, Ethan 02/19/2022 3:45:37 PM Disp. Time Eilene Ghazi Time) Disposition Final User 02/19/2022 3:40:54 PM Send to Urgent [facility] Spine 02/19/2022 3:47:51 PM Home Care Yes Lavina Hamman, RN,  Thomasena Edis PLEASE NOTE: All timestamps contained within this report are represented as [place] Standard Time. CONFIDENTIALTY NOTICE: This fax transmission is intended only for the addressee. It contains information that is legally privileged, confidential or otherwise protected from use or disclosure. If you are not the intended recipient, you are strictly prohibited from reviewing, disclosing, copying using or disseminating any of this information or taking any action in reliance on or regarding this information. If you have received this fax in error, please notify us immediately by telephone so that we can arrange for its return to [name]. Phone: [phone], Toll-Free: [phone], Fax: [phone] Page: 2 of 2 Call Id: [id number] Final Disposition 02/19/2022 3:47:51 PM Home Care Yes Lavina Hamman, RN, Thomasena Edis Caller Disagree/Comply Comply Caller Understands No PreDisposition InappropriateToAsk Care Advice Given Per Guideline HOME CARE: * You should be able to treat this at home. HIGH BLOOD SUGAR (HYPERGLYCEMIA): * Definition: Fasting blood glucose of 126 mg/dL (7.0 mmol/L) or above, or random blood glucose over 200 mg/dL (11.1 mmol/L). * Urine ketones are moderate or large (or more than 1+); if you check blood ketones, when blood ketone test is over 1.4 mmol/L CALL BACK IF: * You become worse * Blood glucose over 300 mg/dL (16.7 mmol/L) two or more times in a row CARE ADVICE given per Diab

## 2022-02-19 NOTE — Telephone Encounter (Signed)
Patient Travis Howell called in and stated that patients blood sugar monitor is just reading high and not providing a number. She checked hers and was able to get a reading. She stated a few days ago it was around the 140s. Patient denied having any symptoms. Sent over to access nurse.

## 2022-02-19 NOTE — Telephone Encounter (Signed)
Fyi to Dr. Darnell Level

## 2022-02-20 ENCOUNTER — Telehealth: Payer: Self-pay

## 2022-02-20 NOTE — Telephone Encounter (Signed)
Recent labs were very reassuring which makes me think this could be glucometer error.  Has he recently taken any steroid PO or injection?  Recommend push water and check more frequently, bring sugar log to appt next week.  Let us know sooner if he is getting sugar readings >300.

## 2022-02-20 NOTE — Telephone Encounter (Signed)
Rtn pt's call. States he has not taken any steroids or had steroid inj. I relayed Dr. Synthia Innocent message. Pt verbalizes understanding. Says he also thinks it was an error with his glucometer. BS was 105 this AM.

## 2022-02-20 NOTE — Telephone Encounter (Signed)
Patient returned call,would ,ike a cb

## 2022-02-20 NOTE — Telephone Encounter (Signed)
Lvm asking pt to call back. Need to relay Dr. Synthia Innocent message and get answer to his question.

## 2022-02-20 NOTE — Telephone Encounter (Signed)
VMT pt. Requested call back to discuss starting PREP on 03/09/22

## 2022-02-23 ENCOUNTER — Ambulatory Visit (INDEPENDENT_AMBULATORY_CARE_PROVIDER_SITE_OTHER)
Admission: RE | Admit: 2022-02-23 | Discharge: 2022-02-23 | Disposition: A | Discharge status: Home / Self Care | Payer: Medicare Other | Source: Ambulatory Visit | Attending: Family Medicine | Admitting: Family Medicine

## 2022-02-23 ENCOUNTER — Ambulatory Visit: Payer: Medicare Other | Admitting: Family Medicine

## 2022-02-23 ENCOUNTER — Encounter: Payer: Self-pay | Admitting: Family Medicine

## 2022-02-23 VITALS — BP 144/88 | HR 71 | Temp 97.1°F | Ht 66.5 in | Wt 223.2 lb

## 2022-02-23 DIAGNOSIS — Z01818 Encounter for other preprocedural examination: Secondary | ICD-10-CM

## 2022-02-23 DIAGNOSIS — I10 Essential (primary) hypertension: Secondary | ICD-10-CM | POA: Diagnosis not present

## 2022-02-23 DIAGNOSIS — I252 Old myocardial infarction: Secondary | ICD-10-CM

## 2022-02-23 DIAGNOSIS — E1169 Type 2 diabetes mellitus with other specified complication: Secondary | ICD-10-CM | POA: Diagnosis not present

## 2022-02-23 DIAGNOSIS — I4819 Other persistent atrial fibrillation: Secondary | ICD-10-CM

## 2022-02-23 DIAGNOSIS — I2510 Atherosclerotic heart disease of native coronary artery without angina pectoris: Secondary | ICD-10-CM | POA: Diagnosis not present

## 2022-02-23 DIAGNOSIS — J986 Disorders of diaphragm: Secondary | ICD-10-CM

## 2022-02-23 NOTE — Assessment & Plan Note (Signed)
RCRI = 1  Check CXR today Cardiac clearance through cardiology (appt tomorrow).  Recent labs attached including A1c 7%.  He will have labwork through ortho office per preop form.  Anticipate adequately low risk from medical standpoint to proceed with planned surgical intervention.
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
## 2022-02-23 NOTE — Assessment & Plan Note (Signed)
On zetia, crestor, aspirin, eliquis.  Has cardiology appt tomorrow.

## 2022-02-23 NOTE — Assessment & Plan Note (Signed)
Stable period on eliquis and toprol XL, regularly sees EP.

## 2022-02-23 NOTE — Assessment & Plan Note (Signed)
Chronic, stable on metformin 560m daily. Latest A1c 7%.

## 2022-02-23 NOTE — Progress Notes (Unsigned)
Patient ID: Travis Howell, male    DOB: December 11, 1950, 72 y.o.   MRN: [id number]  This visit was conducted in person.  BP (!) 144/88   Pulse 71   Temp (!) 97.1 F (36.2 C) (Temporal)   Ht 5' 6.5" (1.689 m)   Wt 223 lb 4 oz (101.3 kg)   SpO2 95%   BMI 35.49 kg/m   BP Readings from Last 3 Encounters:  02/24/22 (!) 148/88  02/23/22 (!) 144/88  02/02/22 (!) 144/82  160s/100 on repeat testing Home BP readings 140s/80s  CC: preop eval Subjective:   HPI: Travis Howell is a 72 y.o. male presenting on 02/23/2022 for Pre-op Exam ( Pt needs L knee arthroplasty. Surgery date TBD.)   Travis Howell  has a past medical history of Anxiety, Arthritis, BPH (benign prostatic hypertrophy) with urinary obstruction, CAD (coronary artery disease) (2012), Chronic lower back pain, Depression, Depression with anxiety, Deviated septum, Diet-controlled type 2 diabetes mellitus ([place]) (02/13/2017), Felon of finger of right hand (02/02/2022), History of chicken pox, HLD (hyperlipidemia), HTN (hypertension), Localized osteoarthritis of right shoulder (2016), Myocardial infarction ([place]) (2011), PAF (paroxysmal atrial fibrillation) ([place]), RLS (restless legs syndrome), and Shortness of breath dyspnea.  Planned upcoming L knee replacement surgery date TBD by Dr Mayer Camel for debilitating L knee pain due to osteoarthritis. S/p steroid injection without much benefit.   Patient has tolerated anesthesia well in the past.  Latest surgical intervention was [id number] - R kidney stone laser surgery, prior to this L total shoulder replacement 09/2019.  Denies trouble with post-op nausea/vomiting, or trouble awakening after surgery.   Denies chest pain, dyspnea, palpitations, leg swelling, dizziness.  No fevers/chills, coughing, abd pain, diarrhea, or UTI symptoms.  No significant h/o asthma or COPD.   Persistent atrial fibrillation - followed by EP ([place], [place]) last seen 01/28/2022, on eliquis, toprol XL 37m daily  s/p ablation 2023. [facility]   Known CAD s/p DES to LAD 2012 followed by Dr JLarae Grooms(upcoming appt tomorrow).   Recent paronychia/felon to R 5th digit that improved with augmentin antibiotic - didn't need surgery.   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 He notes he's been eating more salted shelled nuts and pretzels at night recently.      Relevant past medical, surgical, family and social history reviewed and updated as indicated. Interim medical history since our last visit reviewed. Allergies and medications reviewed and updated. Outpatient Medications Prior to Visit  Medication Sig Dispense Refill   albuterol (VENTOLIN HFA) 108 (90 Base) MCG/ACT inhaler USE 2 INHALATIONS BY MOUTH  EVERY 6 HOURS AS NEEDED FOR WHEEZING OR SHORTNESS OF  BREATH 72 g 0   aspirin EC 81 MG tablet Take 81 mg by mouth daily. Swallow whole.     colchicine 0.6 MG tablet TAKE 1 TABLET BY MOUTH DAILY AS  NEEDED FOR GOUT FLARE 90 tablet 0   diphenhydrAMINE-APAP, sleep, (TYLENOL PM EXTRA STRENGTH PO) Take 2 tablets by mouth at bedtime as needed (pain).     ELIQUIS 5 MG TABS tablet TAKE 1 TABLET BY MOUTH TWICE  DAILY 180 tablet 3   ezetimibe (ZETIA) 10 MG tablet Take 1 tablet (10 mg total) by mouth daily. 90 tablet 4   metFORMIN (GLUCOPHAGE) 500 MG  tablet Take 1 tablet (500 mg total) by mouth daily with breakfast. 90 tablet 4   metoprolol succinate (TOPROL-XL) 100 MG 24 hr tablet TAKE 1 TABLET BY MOUTH AT  BEDTIME 90 tablet 1   naproxen sodium (ALEVE) 220 MG tablet Take 440 mg by mouth daily as needed (pain).     Omega-3 Fatty Acids (FISH OIL) 1000 MG CAPS Take 3,000 mg by mouth daily.     OVER THE COUNTER MEDICATION Place 4-5 drops into both ears daily as needed (ear wax). OTC ear drop for wax.      potassium chloride (KLOR-CON) 20 MEQ packet Take 10 mEq by mouth as needed (leg cramps).     silodosin (RAPAFLO) 8 MG CAPS capsule Take 1 capsule (8 mg total) by mouth daily with breakfast. 90 capsule 4   rosuvastatin (CRESTOR) 10 MG tablet Take 1 tablet (10 mg total) by mouth daily. 90 tablet 4   amoxicillin-clavulanate (AUGMENTIN) 875-125 MG tablet Take 1 tablet by mouth 2 (two) times daily. 20 tablet 0   Multiple Vitamin (MULTIVITAMIN ADULT PO) Take 2 tablets by mouth daily.     No facility-administered medications prior to visit.     Per HPI unless specifically indicated in ROS section below Review of Systems  Objective:  BP (!) 144/88   Pulse 71   Temp (!) 97.1 F (36.2 C) (Temporal)   Ht 5' 6.5" (1.689 m)   Wt 223 lb 4 oz (101.3 kg)   SpO2 95%   BMI 35.49 kg/m   Wt Readings from Last 3 Encounters:  02/24/22 222 lb 3.2 oz (100.8 kg)  02/23/22 223 lb 4 oz (101.3 kg)  02/02/22 224 lb 4 oz (101.7 kg)      Physical Exam Vitals and nursing note reviewed.  Constitutional:      Appearance: Normal appearance. He is not ill-appearing.  HENT:     Head: Normocephalic and atraumatic.     Mouth/Throat:     Mouth: Mucous membranes are moist.     Pharynx: Oropharynx is clear. No oropharyngeal exudate or posterior oropharyngeal erythema.  Eyes:     Extraocular Movements: Extraocular movements intact.     Pupils: Pupils are equal, round, and reactive to light.     Comments: Anisocoria - L pupil is smaller - per his eye doctor this is normal  Cardiovascular:     Rate and Rhythm: Normal rate and regular rhythm.     Pulses: Normal pulses.     Heart sounds: Normal heart sounds. No murmur heard. Pulmonary:     Effort: Pulmonary effort is normal. No respiratory distress.     Breath sounds: Normal breath sounds. No wheezing, rhonchi or rales.  Musculoskeletal:     Cervical back: Normal range of motion. No rigidity.     Right lower leg: No edema.     Left lower leg: No edema.   Lymphadenopathy:     Cervical: No cervical adenopathy.  Skin:    General: Skin is warm and dry.     Findings: No rash.  Neurological:     Mental Status: He is alert.  Psychiatric:        Mood and Affect: Mood normal.        Behavior: Behavior normal.       Results for orders placed or performed in visit on 02/02/22  Hemoglobin A1c  Result Value Ref Range   Hgb A1c MFr Bld 7.0 (H) 4.6 - 6.5 %  Microalbumin / creatinine urine ratio  Result  Value Ref Range   Microalb, Ur 2.4 (H) 0.0 - 1.9 mg/dL   Creatinine,U 155.9 mg/dL   Microalb Creat Ratio 1.5 0.0 - 30.0 mg/g  Lipid panel  Result Value Ref Range   Cholesterol 122 0 - 200 mg/dL   Triglycerides 73.0 0.0 - 149.0 mg/dL   HDL 40.20 >39.00 mg/dL   VLDL 14.6 0.0 - 40.0 mg/dL   LDL Cholesterol 68 0 - 99 mg/dL   Total CHOL/HDL Ratio 3    NonHDL 82.17   Comprehensive metabolic panel  Result Value Ref Range   Sodium 141 135 - 145 mEq/L   Potassium 4.3 3.5 - 5.1 mEq/L   Chloride 101 96 - 112 mEq/L   CO2 31 19 - 32 mEq/L   Glucose, Bld 138 (H) 70 - 99 mg/dL   BUN 11 6 - 23 mg/dL   Creatinine, Ser 0.74 0.40 - 1.50 mg/dL   Total Bilirubin 0.6 0.2 - 1.2 mg/dL   Alkaline Phosphatase 62 39 - 117 U/L   AST 19 0 - 37 U/L   ALT 13 0 - 53 U/L   Total Protein 7.3 6.0 - 8.3 g/dL   Albumin 4.6 3.5 - 5.2 g/dL   GFR 91.91 >60.00 mL/min   Calcium 9.1 8.4 - 10.5 mg/dL  PSA  Result Value Ref Range   PSA 0.23 0.10 - 4.00 ng/mL  CBC with Differential/Platelet  Result Value Ref Range   WBC 5.7 4.0 - 10.5 K/uL   RBC 4.06 (L) 4.22 - 5.81 Mil/uL   Hemoglobin 14.0 13.0 - 17.0 g/dL   HCT 39.3 39.0 - 52.0 %   MCV 96.8 78.0 - 100.0 fl   MCHC 35.6 30.0 - 36.0 g/dL   RDW 12.9 11.5 - 15.5 %   Platelets 177.0 150.0 - 400.0 K/uL   Neutrophils Relative % 70.4 43.0 - 77.0 %   Lymphocytes Relative 19.4 12.0 - 46.0 %   Monocytes Relative 6.4 3.0 - 12.0 %   Eosinophils Relative 3.3 0.0 - 5.0 %   Basophils Relative 0.5 0.0 - 3.0 %   Neutro Abs 4.0  1.4 - 7.7 K/uL   Lymphs Abs 1.1 0.7 - 4.0 K/uL   Monocytes Absolute 0.4 0.1 - 1.0 K/uL   Eosinophils Absolute 0.2 0.0 - 0.7 K/uL   Basophils Absolute 0.0 0.0 - 0.1 K/uL  Uric acid  Result Value Ref Range   Uric Acid, Serum 5.9 4.0 - 7.8 mg/dL   DG Chest 2 View CLINICAL DATA:  Preoperative evaluation. Productive cough. History of black lung from working in [place].  EXAM: CHEST - 2 VIEW  COMPARISON:  CT chest 09/26/2020.  Chest x-ray 09/23/2020.  FINDINGS: Mediastinum and hilar structures are unremarkable. Elevation of the right hemidiaphragm with associated right base atelectasis and or scarring again noted. Similar findings noted on prior exams. No focal alveolar infiltrate. Mild right pleural thickening consistent with scarring again noted. No pleural effusion or pneumothorax. Heart size stable. Mild degenerative change thoracic spine. Bilateral shoulder replacements.  IMPRESSION: Elevation of the right hemidiaphragm with associated right base atelectasis and or scarring again noted. Similar findings noted on prior exams. No acute cardiopulmonary disease.  Electronically Signed   By: Marcello Moores  Register M.D.   On: 02/25/2022 09:57   Assessment & Plan:   Problem List Items Addressed This Visit     CAD (coronary artery disease)    On zetia, crestor, aspirin, eliquis.  Has cardiology appt tomorrow.       HTN (hypertension)  Chronic, BP above goal today.  He continues toprol XL 142m nightly without missed dose.  He has been eating more salted foods recently - advised back off this. No medications added today. Reassess at cardiology appt tomorrow.       Severe obesity (BMI 35.0-39.9) with comorbidity ([facility]   History of MI (myocardial infarction)   Type 2 diabetes mellitus with other specified complication (HCC)    Chronic, stable on metformin 506mdaily. Latest A1c 7%.       Elevated hemidiaphragm    Chronic, present since 2017.  Update CXR.  Pt endorses  trauma 1982 when he fell 5569t while [place] work [place]      Pre-op evaluation - Primary    RCRI = 1  Check CXR today Cardiac clearance through cardiology (appt tomorrow).  Recent labs attached including A1c 7%.  He will have labwork through ortho office per preop form.  Anticipate adequately low risk from medical standpoint to proceed with planned surgical intervention.       Relevant Orders   DG Chest 2 View (Completed)   Persistent atrial fibrillation (HCC)    Stable period on eliquis and toprol XL, regularly sees EP.         No orders of the defined types were placed in this encounter.   Orders Placed This Encounter  Procedures   DG Chest 2 View    Standing Status:   Future    Number of Occurrences:   1    Standing Expiration Date:   02/24/2023    Order Specific Question:   Reason for Exam (SYMPTOM  OR DIAGNOSIS REQUIRED)    Answer:   preop eval    Order Specific Question:   Preferred imaging location?    Answer:   [facility]  Patient Instructions  Chest xray today  Continue toprol XL 10065mt night, continue monitoring BP at home.  Back off salted nuts and pretzels.  Your goal blood pressure is <140/90. Work on low salt/sodium diet - goal <2gm (2,000m109mer day. Eat a diet high in fruits/vegetables and whole grains.  Look into mediterranean and DASH diet. Goal activity is 150mi35m of moderate intensity exercise.  This can be split into 30 minute chunks.  If you are not at this level, you can start with smaller 10-15 min increments and slowly build up activity. Look at www.[place]for more resources  Good to see you today.  We will forward results from today to Dr RowanDamita Dunningsce.   Follow up plan: Return if symptoms worsen or fail to improve.  JavieRia Bush

## 2022-02-23 NOTE — Patient Instructions (Addendum)
Chest xray today  Continue toprol XL 184m at night, continue monitoring BP at home.  Back off salted nuts and pretzels.  Your goal blood pressure is <140/90. Work on low salt/sodium diet - goal <2gm (2,0036m per day. Eat a diet high in fruits/vegetables and whole grains.  Look into mediterranean and DASH diet. Goal activity is 15014mwk of moderate intensity exercise.  This can be split into 30 minute chunks.  If you are not at this level, you can start with smaller 10-15 min increments and slowly build up activity. Look at [email or web address] for more resources  Good to see you today.  We will forward results from today to Dr RowDamita Dunningsfice.

## 2022-02-23 NOTE — Assessment & Plan Note (Addendum)
Chronic, present since 2017.  Update CXR.  Pt endorses trauma 1982 when he fell 28 ft while at work in [employer].

## 2022-02-23 NOTE — Assessment & Plan Note (Signed)
Chronic, BP above goal today.  He continues toprol XL 178m nightly without missed dose.  He has been eating more salted foods recently - advised back off this. No medications added today. Reassess at cardiology appt tomorrow.

## 2022-02-24 ENCOUNTER — Encounter: Payer: Self-pay | Admitting: Nurse Practitioner

## 2022-02-24 ENCOUNTER — Ambulatory Visit: Payer: Medicare Other | Attending: Nurse Practitioner | Admitting: Nurse Practitioner

## 2022-02-24 ENCOUNTER — Ambulatory Visit: Payer: Medicare Other | Admitting: Orthopaedic Surgery

## 2022-02-24 VITALS — BP 148/88 | HR 67 | Ht 67.0 in | Wt 222.2 lb

## 2022-02-24 DIAGNOSIS — I2510 Atherosclerotic heart disease of native coronary artery without angina pectoris: Secondary | ICD-10-CM | POA: Diagnosis not present

## 2022-02-24 DIAGNOSIS — I10 Essential (primary) hypertension: Secondary | ICD-10-CM

## 2022-02-24 DIAGNOSIS — Z01810 Encounter for preprocedural cardiovascular examination: Secondary | ICD-10-CM | POA: Diagnosis not present

## 2022-02-24 DIAGNOSIS — I4819 Other persistent atrial fibrillation: Secondary | ICD-10-CM

## 2022-02-24 DIAGNOSIS — E782 Mixed hyperlipidemia: Secondary | ICD-10-CM

## 2022-02-24 NOTE — Patient Instructions (Signed)
Medication Instructions:  Your physician recommends that you continue on your current medications as directed. Please refer to the Current Medication list given to you today. *If you need a refill on your cardiac medications before your next appointment, please call your pharmacy*   Lab Work: None ordered   Testing/Procedures: None ordered   Follow-Up: At [facility], you and your health needs are our priority.  As part of our continuing mission to provide you with exceptional heart care, we have created designated Provider Care Teams.  These Care Teams include your primary Cardiologist (physician) and Advanced Practice Providers (APPs -  Physician Assistants and Nurse Practitioners) who all work together to provide you with the care you need, when you need it.  We recommend signing up for the patient portal called "MyChart".  Sign up information is provided on this After Visit Summary.  MyChart is used to connect with patients for Virtual Visits (Telemedicine).  Patients are able to view lab/test results, encounter notes, upcoming appointments, etc.  Non-urgent messages can be sent to your provider as well.   To learn more about what you can do with MyChart, go to [email or web address].    Your next appointment:   FIRST AVAILABLE    Provider:   Larae Grooms, MD     Other Instructions [place]

## 2022-02-25 ENCOUNTER — Other Ambulatory Visit: Payer: Self-pay | Admitting: Orthopedic Surgery

## 2022-02-25 MED ORDER — ROSUVASTATIN CALCIUM 20 MG PO TABS: 20.0000 mg | ORAL_TABLET | Freq: Every day | ORAL | 1 refills | Status: DC

## 2022-02-25 NOTE — Telephone Encounter (Signed)
Patient with diagnosis of afib on Eliquis for anticoagulation.    Procedure: left TKA Date of procedure: TBD  CHA2DS2-VASc Score = 4  This indicates a 4.8% annual risk of stroke. The patient's score is based upon: CHF History: 0 HTN History: 1 Diabetes History: 1 Stroke History: 0 Vascular Disease History: 1 Age Score: 1 Gender Score: 0  CrCl >168m/min Platelet count 177K  Per office protocol, patient can hold Eliquis for 3 days prior to procedure.    **This guidance is not considered finalized until pre-operative APP has relayed final recommendations.**

## 2022-02-25 NOTE — Addendum Note (Signed)
Addended by: Whitman Hero on: 02/25/2022 01:26 PM   Modules accepted: Orders

## 2022-02-25 NOTE — Telephone Encounter (Signed)
Travis Howell, Please contact Travis Howell and let him know to hold his aspirin 7 days prior to procedure and also he can hold Eliquis 3 days prior to procedure and restart once hemostasis is achieved postprocedure.  [name]

## 2022-02-25 NOTE — Telephone Encounter (Signed)
Hi [name]!  You saw this patient yesterday for preoperative risk assessment. Do you mind adding an addendum to your note with pharm d recommendations and routing it to the requesting office?  Thank you!  DW

## 2022-02-25 NOTE — Telephone Encounter (Signed)
Patient agreeable and voiced understanding.   Per [name] Patient needs to increase Crestor to 57m once a day.   Rx(s) sent to pharmacy electronically.

## 2022-02-26 NOTE — Telephone Encounter (Signed)
Faxed form, OV notes and results to [place] at [phone], attn: Marlin Canary.

## 2022-03-19 NOTE — Patient Instructions (Addendum)
SURGICAL WAITING ROOM VISITATION  Patients having surgery or a procedure may have no more than 2 support people in the waiting area - these visitors may rotate.    Children under the age of 14 must have an adult with them who is not the patient.  Due to an increase in RSV and influenza rates and associated hospitalizations, children ages 40 and under may not visit patients in [place].  If the patient needs to stay at the hospital during part of their recovery, the visitor guidelines for inpatient rooms apply. Pre-op nurse will coordinate an appropriate time for 1 support person to accompany patient in pre-op.  This support person may not rotate.    Please refer to the [facility] website for the visitor guidelines for Inpatients (after your surgery is over and you are in a regular room).    Your procedure is scheduled on: 03/30/22   Report to [facility] Main Entrance    Report to admitting at 10:00 AM   Call this number if you have problems the morning of surgery [phone]   Do not eat food :After Midnight.   After Midnight you may have the following liquids until 9:30 AM DAY OF SURGERY  Water Non-Citrus Juices (without pulp, NO RED-Apple, White grape, White cranberry) Black Coffee (NO MILK/CREAM OR CREAMERS, sugar ok)  Clear Tea (NO MILK/CREAM OR CREAMERS, sugar ok) regular and decaf                             Plain Jell-O (NO RED)                                           Fruit ices (not with fruit pulp, NO RED)                                     Popsicles (NO RED)                                                               Sports drinks like Gatorade (NO RED)                 The day of surgery:  Drink ONE (1) Pre-Surgery G2 at 9:30 AM the morning of surgery. Drink in one sitting. Do not sip.  This drink was given to you during your hospital  pre-op appointment visit. Nothing else to drink after completing the  Pre-Surgery G2.          If you  have questions, please contact your surgeon's office.   FOLLOW BOWEL PREP AND ANY ADDITIONAL PRE OP INSTRUCTIONS YOU RECEIVED FROM YOUR SURGEON'S OFFICE!!!     Oral Hygiene is also important to reduce your risk of infection.                                    Remember - BRUSH YOUR TEETH THE MORNING OF SURGERY WITH YOUR REGULAR TOOTHPASTE  DENTURES WILL BE REMOVED  PRIOR TO SURGERY PLEASE DO NOT APPLY "Poly grip" OR ADHESIVES!!!   Take these medicines the morning of surgery with A SIP OF WATER: Zetia, Flonase, Metoprolol, Rosuvastatin   These are anesthesia recommendations for holding your anticoagulants.  Please contact your prescribing physician to confirm IF it is safe to hold your anticoagulants for this length of time.   Eliquis Apixaban   72 hours   Xarelto Rivaroxaban   72 hours  Plavix Clopidogrel   120 hours  Pletal Cilostazol   120 hours    DO NOT TAKE ANY ORAL DIABETIC MEDICATIONS DAY OF YOUR SURGERY  How to Manage Your Diabetes Before and After Surgery  Why is it important to control my blood sugar before and after surgery? Improving blood sugar levels before and after surgery helps healing and can limit problems. A way of improving blood sugar control is eating a healthy diet by:  Eating less sugar and carbohydrates  Increasing activity/exercise  Talking with your doctor about reaching your blood sugar goals High blood sugars (greater than 180 mg/dL) can raise your risk of infections and slow your recovery, so you will need to focus on controlling your diabetes during the weeks before surgery. Make sure that the doctor who takes care of your diabetes knows about your planned surgery including the date and location.  How do I manage my blood sugar before surgery? Check your blood sugar at least 4 times a day, starting 2 days before surgery, to make sure that the level is not too high or low. Check your blood sugar the morning of your surgery when you wake up and every  2 hours until you get to the Short Stay unit. If your blood sugar is less than 70 mg/dL, you will need to treat for low blood sugar: Do not take insulin. Treat a low blood sugar (less than 70 mg/dL) with  cup of clear juice (cranberry or apple), 4 glucose tablets, OR glucose gel. Recheck blood sugar in 15 minutes after treatment (to make sure it is greater than 70 mg/dL). If your blood sugar is not greater than 70 mg/dL on recheck, call [phone] for further instructions. Report your blood sugar to the short stay nurse when you get to Short Stay.  If you are admitted to the hospital after surgery: Your blood sugar will be checked by the staff and you will probably be given insulin after surgery (instead of oral diabetes medicines) to make sure you have good blood sugar levels. The goal for blood sugar control after surgery is 80-180 mg/dL.   WHAT DO I DO ABOUT MY DIABETES MEDICATION?  Do not take oral diabetes medicines (pills) the morning of surgery.  THE DAY BEFORE SURGERY, take Metformin as prescribed.    THE MORNING OF SURGERY, do not take Metformin.  Reviewed and Endorsed by [facility] Patient Education Committee, August 2015                              You may not have any metal on your body including jewelry, and body piercing             Do not wear lotions, powders, cologne, or deodorant  Do not shave  48 hours prior to surgery.               Men may shave face and neck.   Do not bring valuables to the hospital. [facility] IS NOT  RESPONSIBLE   FOR VALUABLES.   Contacts, glasses, dentures or bridgework may not be worn into surgery.   Bring small overnight bag day of surgery.   DO NOT BRING YOUR HOME MEDICATIONS TO THE HOSPITAL. PHARMACY WILL DISPENSE MEDICATIONS LISTED ON YOUR MEDICATION LIST TO YOU DURING YOUR ADMISSION IN THE HOSPITAL!    Patients discharged on the day of surgery will not be allowed to drive home.  Someone NEEDS to stay with you  for the first 24 hours after anesthesia.              Please read over the following fact sheets you were given: IF YOU HAVE QUESTIONS ABOUT YOUR PRE-OP INSTRUCTIONS PLEASE CALL [phone] Contras    If you received a COVID test during your pre-op visit  it is requested that you wear a mask when out in public, stay away from anyone that may not be feeling well and notify your surgeon if you develop symptoms. If you test positive for Covid or have been in contact with anyone that has tested positive in the last 10 days please notify you surgeon.    [place] - Preparing for Surgery Before surgery, you can play an important role.  Because skin is not sterile, your skin needs to be as free of germs as possible.  You can reduce the number of germs on your skin by washing with CHG (chlorahexidine gluconate) soap before surgery.  CHG is an antiseptic cleaner which kills germs and bonds with the skin to continue killing germs even after washing. Please DO NOT use if you have an allergy to CHG or antibacterial soaps.  If your skin becomes reddened/irritated stop using the CHG and inform your nurse when you arrive at Short Stay. Do not shave (including legs and underarms) for at least 48 hours prior to the first CHG shower.  You may shave your face/neck.  Please follow these instructions carefully:  1.  Shower with CHG Soap the night before surgery and the  morning of surgery.  2.  If you choose to wash your hair, wash your hair first as usual with your normal  shampoo.  3.  After you shampoo, rinse your hair and body thoroughly to remove the shampoo.                             4.  Use CHG as you would any other liquid soap.  You can apply chg directly to the skin and wash.  Gently with a scrungie or clean washcloth.  5.  Apply the CHG Soap to your body ONLY FROM THE NECK DOWN.   Do   not use on face/ open                           Wound or open sores. Avoid contact with eyes, ears mouth and   genitals  (private parts).                       Wash face,  Genitals (private parts) with your normal soap.             6.  Wash thoroughly, paying special attention to the area where your    surgery  will be performed.  7.  Thoroughly rinse your body with warm water from the neck down.  8.  DO NOT shower/wash with your normal soap after  using and rinsing off the CHG Soap.                9.  Pat yourself dry with a clean towel.            10.  Wear clean pajamas.            11.  Place clean sheets on your bed the night of your first shower and do not  sleep with pets. Day of Surgery : Do not apply any lotions/deodorants the morning of surgery.  Please wear clean clothes to the hospital/surgery center.  FAILURE TO FOLLOW THESE INSTRUCTIONS MAY RESULT IN THE CANCELLATION OF YOUR SURGERY  PATIENT SIGNATURE_________________________________  NURSE SIGNATURE__________________________________  ________________________________________________________________________  Adam Phenix  An incentive spirometer is a tool that can help keep your lungs clear and active. This tool measures how well you are filling your lungs with each breath. Taking long deep breaths may help reverse or decrease the chance of developing breathing (pulmonary) problems (especially infection) following: A long period of time when you are unable to move or be active. BEFORE THE PROCEDURE  If the spirometer includes an indicator to show your best effort, your nurse or respiratory therapist will set it to a desired goal. If possible, sit up straight or lean slightly forward. Try not to slouch. Hold the incentive spirometer in an upright position. INSTRUCTIONS FOR USE  Sit on the edge of your bed if possible, or sit up as far as you can in bed or on a chair. Hold the incentive spirometer in an upright position. Breathe out normally. Place the mouthpiece in your mouth and seal your lips tightly around it. Breathe in slowly and  as deeply as possible, raising the piston or the ball toward the top of the column. Hold your breath for 3-5 seconds or for as long as possible. Allow the piston or ball to fall to the bottom of the column. Remove the mouthpiece from your mouth and breathe out normally. Rest for a few seconds and repeat Steps 1 through 7 at least 10 times every 1-2 hours when you are awake. Take your time and take a few normal breaths between deep breaths. The spirometer may include an indicator to show your best effort. Use the indicator as a goal to work toward during each repetition. After each set of 10 deep breaths, practice coughing to be sure your lungs are clear. If you have an incision (the cut made at the time of surgery), support your incision when coughing by placing a pillow or rolled up towels firmly against it. Once you are able to get out of bed, walk around indoors and cough well. You may stop using the incentive spirometer when instructed by your caregiver.  RISKS AND COMPLICATIONS Take your time so you do not get dizzy or light-headed. If you are in pain, you may need to take or ask for pain medication before doing incentive spirometry. It is harder to take a deep breath if you are having pain. AFTER USE Rest and breathe slowly and easily. It can be helpful to keep track of a log of your progress. Your caregiver can provide you with a simple table to help with this. If you are using the spirometer at home, follow these instructions: [employer] IF:  You are having difficultly using the spirometer. You have trouble using the spirometer as often as instructed. Your pain medication is not giving enough relief while using the spirometer. You develop  fever of 100.5 F (38.1 C) or higher. SEEK IMMEDIATE MEDICAL CARE IF:  You cough up bloody sputum that had not been present before. You develop fever of 102 F (38.9 C) or greater. You develop worsening pain at or near the incision site. MAKE  SURE YOU:  Understand these instructions. Will watch your condition. Will get help right away if you are not doing well or get worse. Document Released: 05/04/2006 Document Revised: 03/16/2011 Document Reviewed: 07/05/2006 [facility] Patient Information 2014 [place], [place].   ________________________________________________________________________

## 2022-03-19 NOTE — Progress Notes (Addendum)
COVID Vaccine Completed: yes  Date of COVID positive in last 90 days: no  PCP - Lockie Mola, MD Cardiologist - Larae Grooms, MD Electrophysiologist- Allegra Lai, MD  Cardiac clearance by Ambrose Pancoast 02/24/22 in [place] clearance by [name] 02/23/22 in [facility]   Chest x-ray - 02/25/22 Epic EKG - 02/24/22 Epic Stress Test - 3 years ago per pt ECHO - 09/27/20 Epic Cardiac Cath - ablation 04/23/21 Epic Pacemaker/ICD device last checked: n/a Spinal Cord Stimulator: n/a  Bowel Prep - no  Sleep Study - n/a CPAP -   Fasting Blood Sugar - 130-150 Checks Blood Sugar 1 times a day  Last dose of GLP1 agonist-  N/A GLP1 instructions:  N/A   Last dose of SGLT-2 inhibitors-  N/A SGLT-2 instructions: N/A   Blood Thinner Instructions: Eliquis, hold 3 days Aspirin Instructions: ASA 81, hold 7 days Last Dose: 03/26/22 2200  Activity level: Can go up a flight of stairs and perform activities of daily living without stopping and without symptoms of chest pain or shortness of breath.   Anesthesia review: CAD, HTN, a fib, DM2, lung disease, MI, stent  Patient denies shortness of breath, fever, cough and chest pain at PAT appointment  Patient verbalized understanding of instructions that were given to them at the PAT appointment. Patient was also instructed that they will need to review over the PAT instructions again at home before surgery.

## 2022-03-20 ENCOUNTER — Other Ambulatory Visit: Payer: Self-pay

## 2022-03-20 ENCOUNTER — Encounter (HOSPITAL_COMMUNITY): Payer: Self-pay

## 2022-03-20 ENCOUNTER — Encounter (HOSPITAL_COMMUNITY)
Admission: RE | Admit: 2022-03-20 | Discharge: 2022-03-20 | Disposition: A | Discharge status: Home / Self Care | Payer: Medicare Other | Source: Ambulatory Visit | Attending: Orthopedic Surgery | Admitting: Orthopedic Surgery

## 2022-03-20 VITALS — BP 142/88 | HR 57 | Temp 98.6°F | Resp 14 | Ht 67.0 in | Wt 220.0 lb

## 2022-03-20 DIAGNOSIS — I10 Essential (primary) hypertension: Secondary | ICD-10-CM | POA: Diagnosis not present

## 2022-03-20 DIAGNOSIS — I2510 Atherosclerotic heart disease of native coronary artery without angina pectoris: Secondary | ICD-10-CM | POA: Diagnosis not present

## 2022-03-20 DIAGNOSIS — Z01818 Encounter for other preprocedural examination: Secondary | ICD-10-CM

## 2022-03-20 DIAGNOSIS — Z01812 Encounter for preprocedural laboratory examination: Secondary | ICD-10-CM | POA: Diagnosis not present

## 2022-03-20 DIAGNOSIS — I480 Paroxysmal atrial fibrillation: Secondary | ICD-10-CM | POA: Diagnosis not present

## 2022-03-20 DIAGNOSIS — Z87891 Personal history of nicotine dependence: Secondary | ICD-10-CM | POA: Insufficient documentation

## 2022-03-20 DIAGNOSIS — E1169 Type 2 diabetes mellitus with other specified complication: Secondary | ICD-10-CM | POA: Diagnosis not present

## 2022-03-20 DIAGNOSIS — I252 Old myocardial infarction: Secondary | ICD-10-CM | POA: Diagnosis not present

## 2022-03-20 DIAGNOSIS — E119 Type 2 diabetes mellitus without complications: Secondary | ICD-10-CM | POA: Insufficient documentation

## 2022-03-20 DIAGNOSIS — N401 Enlarged prostate with lower urinary tract symptoms: Secondary | ICD-10-CM | POA: Insufficient documentation

## 2022-03-20 DIAGNOSIS — M1712 Unilateral primary osteoarthritis, left knee: Secondary | ICD-10-CM | POA: Insufficient documentation

## 2022-03-20 HISTORY — DX: Personal history of urinary calculi: Z87.442

## 2022-03-20 LAB — BASIC METABOLIC PANEL
Anion gap: 8 (ref 5–15)
BUN: 16 mg/dL (ref 8–23)
CO2: 27 mmol/L (ref 22–32)
Calcium: 9 mg/dL (ref 8.9–10.3)
Chloride: 103 mmol/L (ref 98–111)
Creatinine, Ser: 0.73 mg/dL (ref 0.61–1.24)
GFR, Estimated: >60 mL/min (ref >60)
Glucose, Bld: 145 mg/dL — ABNORMAL HIGH (ref 70–99)
Potassium: 4.3 mmol/L (ref 3.5–5.1)
Sodium: 138 mmol/L (ref 135–145)

## 2022-03-20 LAB — CBC
HCT: 39.8 % (ref 39.0–52.0)
Hemoglobin: 14.1 g/dL (ref 13.0–17.0)
MCH: 34.1 pg — ABNORMAL HIGH (ref 26.0–34.0)
MCHC: 35.4 g/dL (ref 30.0–36.0)
MCV: 96.1 fL (ref 80.0–100.0)
Platelets: 158 10*3/uL (ref 150–400)
RBC: 4.14 MIL/uL — ABNORMAL LOW (ref 4.22–5.81)
RDW: 12.3 % (ref 11.5–15.5)
WBC: 5 10*3/uL (ref 4.0–10.5)
nRBC: 0 % (ref 0.0–0.2)

## 2022-03-20 LAB — SURGICAL PCR SCREEN
MRSA, PCR: NEGATIVE
Staphylococcus aureus: POSITIVE — AB

## 2022-03-20 LAB — GLUCOSE, CAPILLARY: Glucose-Capillary: 153 mg/dL — ABNORMAL HIGH (ref 70–99)

## 2022-03-20 NOTE — Progress Notes (Signed)
[facility] results routed to Dr. Mayer Camel
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
## 2022-03-23 NOTE — Anesthesia Preprocedure Evaluation (Addendum)
Anesthesia Evaluation  Patient identified by MRN, date of birth, ID band Patient awake    Reviewed: Allergy & Precautions, NPO status , Patient's Chart, lab work & pertinent test results  History of Anesthesia Complications Negative for: history of anesthetic complications  Airway Mallampati: III  TM Distance: >3 FB Neck ROM: Full   Comment: Previous grade I view with Miller 2, easy mask Dental  (+) Dental Advisory Given   Pulmonary neg shortness of breath, neg sleep apnea, neg COPD, neg recent URI, former smoker   Pulmonary exam normal breath sounds clear to auscultation       Cardiovascular hypertension (metoprolol), Pt. on home beta blockers (-) angina + CAD, + Past MI (2012) and + Cardiac Stents (2012)  (-) CABG + dysrhythmias (paroxysmal s/p ablation 04/23/2021) Atrial Fibrillation  Rhythm:Regular Rate:Normal  HLD  TTE 09/27/2020: IMPRESSIONS     1. Left ventricular ejection fraction, by estimation, is 50 to 55%. The  left ventricle has low normal function. The left ventricle has no regional  wall motion abnormalities. Left ventricular diastolic function could not  be evaluated.   2. Right ventricular systolic function is normal. The right ventricular  size is normal. There is mildly elevated pulmonary artery systolic  pressure.   3. Left atrial size was moderately dilated.   4. Right atrial size was moderately dilated.   5. The mitral valve is normal in structure. Trivial mitral valve  regurgitation. No evidence of mitral stenosis.   6. The aortic valve is grossly normal. Aortic valve regurgitation is not  visualized. No aortic stenosis is present.   7. The inferior vena cava is dilated in size with <50% respiratory  variability, suggesting right atrial pressure of 15 mmHg.     Neuro/Psych neg Seizures PSYCHIATRIC DISORDERS Anxiety Depression    Chronic low back pain    GI/Hepatic negative GI ROS, Neg liver ROS,,,   Endo/Other  diabetes (Hgb A1c 7.0), Type 2, Oral Hypoglycemic Agents    Renal/GU negative Renal ROS     Musculoskeletal  (+) Arthritis , Osteoarthritis,    Abdominal   Peds  Hematology negative hematology ROS (+)   Anesthesia Other Findings Last Eliquis: 03/26/2022  Platelets 158 03/20/2022  Reproductive/Obstetrics                             Anesthesia Physical Anesthesia Plan  ASA: 3  Anesthesia Plan: MAC, Regional and Spinal   Post-op Pain Management: Regional block* and Tylenol PO (pre-op)*   Induction: Intravenous  PONV Risk Score and Plan: 1 and Ondansetron, Dexamethasone, Propofol infusion and Treatment may vary due to age or medical condition  Airway Management Planned: Natural Airway and Simple Face Mask  Additional Equipment:   Intra-op Plan:   Post-operative Plan: Extubation in OR  Informed Consent:      Dental advisory given  Plan Discussed with: CRNA and Anesthesiologist  Anesthesia Plan Comments: (See PAT note 03/20/2022  Discussed potential risks of nerve blocks including, but not limited to, infection, bleeding, nerve damage, seizures, pneumothorax, respiratory depression, and potential failure of the block. Alternatives to nerve blocks discussed. All questions answered.  I have discussed risks of neuraxial anesthesia including but not limited to infection, bleeding, nerve injury, back pain, headache, seizures, and failure of block. Patient denies bleeding disorders and is not currently anticoagulated. Labs have been reviewed. Risks and benefits discussed. All patient's questions answered.   Discussed with patient risks of MAC including, but  not limited to, minor pain or discomfort, hearing people in the room, and possible need for backup general anesthesia. Risks for general anesthesia also discussed including, but not limited to, sore throat, hoarse voice, chipped/damaged teeth, injury to vocal cords, nausea and  vomiting, allergic reactions, lung infection, heart attack, stroke, and death. All questions answered. )       Anesthesia Quick Evaluation

## 2022-03-25 DIAGNOSIS — M1712 Unilateral primary osteoarthritis, left knee: Secondary | ICD-10-CM | POA: Diagnosis present

## 2022-03-25 DIAGNOSIS — Z96652 Presence of left artificial knee joint: Secondary | ICD-10-CM | POA: Diagnosis present
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
## 2022-03-30 ENCOUNTER — Ambulatory Visit (HOSPITAL_BASED_OUTPATIENT_CLINIC_OR_DEPARTMENT_OTHER): Payer: Medicare Other | Admitting: Anesthesiology

## 2022-03-30 ENCOUNTER — Other Ambulatory Visit: Payer: Self-pay

## 2022-03-30 ENCOUNTER — Ambulatory Visit (HOSPITAL_COMMUNITY): Payer: Medicare Other | Admitting: Physician Assistant

## 2022-03-30 ENCOUNTER — Encounter (HOSPITAL_COMMUNITY): Payer: Self-pay | Admitting: Orthopedic Surgery

## 2022-03-30 ENCOUNTER — Ambulatory Visit (HOSPITAL_COMMUNITY)
Admission: RE | Admit: 2022-03-30 | Discharge: 2022-03-30 | Disposition: A | Discharge status: Home / Self Care | Payer: Medicare Other | Source: Ambulatory Visit | Attending: Orthopedic Surgery | Admitting: Orthopedic Surgery

## 2022-03-30 ENCOUNTER — Encounter (HOSPITAL_COMMUNITY)
Admission: RE | Disposition: A | Discharge status: Home / Self Care | Payer: Self-pay | Source: Ambulatory Visit | Attending: Orthopedic Surgery

## 2022-03-30 DIAGNOSIS — G8929 Other chronic pain: Secondary | ICD-10-CM | POA: Insufficient documentation

## 2022-03-30 DIAGNOSIS — E1169 Type 2 diabetes mellitus with other specified complication: Secondary | ICD-10-CM

## 2022-03-30 DIAGNOSIS — E119 Type 2 diabetes mellitus without complications: Secondary | ICD-10-CM | POA: Insufficient documentation

## 2022-03-30 DIAGNOSIS — E785 Hyperlipidemia, unspecified: Secondary | ICD-10-CM | POA: Diagnosis not present

## 2022-03-30 DIAGNOSIS — I480 Paroxysmal atrial fibrillation: Secondary | ICD-10-CM | POA: Diagnosis not present

## 2022-03-30 DIAGNOSIS — F32A Depression, unspecified: Secondary | ICD-10-CM | POA: Insufficient documentation

## 2022-03-30 DIAGNOSIS — I4891 Unspecified atrial fibrillation: Secondary | ICD-10-CM | POA: Insufficient documentation

## 2022-03-30 DIAGNOSIS — Z96652 Presence of left artificial knee joint: Secondary | ICD-10-CM | POA: Diagnosis present

## 2022-03-30 DIAGNOSIS — Z87891 Personal history of nicotine dependence: Secondary | ICD-10-CM | POA: Diagnosis not present

## 2022-03-30 DIAGNOSIS — Z79899 Other long term (current) drug therapy: Secondary | ICD-10-CM | POA: Diagnosis not present

## 2022-03-30 DIAGNOSIS — Z7984 Long term (current) use of oral hypoglycemic drugs: Secondary | ICD-10-CM | POA: Insufficient documentation

## 2022-03-30 DIAGNOSIS — Z955 Presence of coronary angioplasty implant and graft: Secondary | ICD-10-CM | POA: Insufficient documentation

## 2022-03-30 DIAGNOSIS — I252 Old myocardial infarction: Secondary | ICD-10-CM | POA: Diagnosis not present

## 2022-03-30 DIAGNOSIS — M1712 Unilateral primary osteoarthritis, left knee: Secondary | ICD-10-CM | POA: Diagnosis not present

## 2022-03-30 DIAGNOSIS — I10 Essential (primary) hypertension: Secondary | ICD-10-CM | POA: Insufficient documentation

## 2022-03-30 DIAGNOSIS — G2581 Restless legs syndrome: Secondary | ICD-10-CM | POA: Diagnosis not present

## 2022-03-30 DIAGNOSIS — I2510 Atherosclerotic heart disease of native coronary artery without angina pectoris: Secondary | ICD-10-CM | POA: Diagnosis not present

## 2022-03-30 DIAGNOSIS — F419 Anxiety disorder, unspecified: Secondary | ICD-10-CM | POA: Diagnosis not present

## 2022-03-30 DIAGNOSIS — Z7901 Long term (current) use of anticoagulants: Secondary | ICD-10-CM | POA: Insufficient documentation

## 2022-03-30 DIAGNOSIS — Z6834 Body mass index (BMI) 34.0-34.9, adult: Secondary | ICD-10-CM | POA: Insufficient documentation

## 2022-03-30 DIAGNOSIS — M545 Low back pain, unspecified: Secondary | ICD-10-CM | POA: Diagnosis not present

## 2022-03-30 HISTORY — PX: TOTAL KNEE ARTHROPLASTY: SHX125

## 2022-03-30 LAB — GLUCOSE, CAPILLARY
Glucose-Capillary: 161 mg/dL — ABNORMAL HIGH (ref 70–99)
Glucose-Capillary: 142 mg/dL — ABNORMAL HIGH (ref 70–99)

## 2022-03-30 SURGERY — ARTHROPLASTY, KNEE, TOTAL
Anesthesia: Monitor Anesthesia Care | Site: Knee | Laterality: Left

## 2022-03-30 MED ORDER — DEXAMETHASONE SODIUM PHOSPHATE 10 MG/ML IJ SOLN
INTRAMUSCULAR | Status: AC
  Filled 2022-03-30: qty 1

## 2022-03-30 MED ORDER — PHENYLEPHRINE HCL-NACL 20-0.9 MG/250ML-% IV SOLN
INTRAVENOUS | Status: DC | PRN
  Administered 2022-03-30: 40 ug/min via INTRAVENOUS

## 2022-03-30 MED ORDER — POVIDONE-IODINE 10 % EX SWAB: 2.0000 | Freq: Once | CUTANEOUS | Status: DC

## 2022-03-30 MED ORDER — LIDOCAINE HCL (PF) 2 % IJ SOLN
INTRAMUSCULAR | Status: AC
  Filled 2022-03-30: qty 5

## 2022-03-30 MED ORDER — BUPIVACAINE-EPINEPHRINE (PF) 0.25% -1:200000 IJ SOLN
INTRAMUSCULAR | Status: AC
  Filled 2022-03-30: qty 30

## 2022-03-30 MED ORDER — WATER FOR IRRIGATION, STERILE IR SOLN
Status: DC | PRN
  Administered 2022-03-30: 2000 mL

## 2022-03-30 MED ORDER — ORAL CARE MOUTH RINSE: 15.0000 mL | Freq: Once | OROMUCOSAL | Status: AC

## 2022-03-30 MED ORDER — CEFAZOLIN SODIUM-DEXTROSE 2-4 GM/100ML-% IV SOLN
2.0000 g | INTRAVENOUS | Status: AC
  Administered 2022-03-30: 2 g via INTRAVENOUS
  Filled 2022-03-30: qty 100

## 2022-03-30 MED ORDER — FENTANYL CITRATE PF 50 MCG/ML IJ SOSY
50.0000 ug | PREFILLED_SYRINGE | INTRAMUSCULAR | Status: DC
  Administered 2022-03-30: 50 ug via INTRAVENOUS
  Filled 2022-03-30: qty 2

## 2022-03-30 MED ORDER — BUPIVACAINE LIPOSOME 1.3 % IJ SUSP: 20.0000 mL | Freq: Once | INTRAMUSCULAR | Status: DC

## 2022-03-30 MED ORDER — PHENYLEPHRINE HCL (PRESSORS) 10 MG/ML IV SOLN
INTRAVENOUS | Status: AC
  Filled 2022-03-30: qty 1

## 2022-03-30 MED ORDER — SODIUM CHLORIDE (PF) 0.9 % IJ SOLN
INTRAMUSCULAR | Status: AC
  Filled 2022-03-30: qty 10

## 2022-03-30 MED ORDER — BUPIVACAINE IN DEXTROSE 0.75-8.25 % IT SOLN
INTRATHECAL | Status: DC | PRN
  Administered 2022-03-30: 1.6 mL via INTRATHECAL

## 2022-03-30 MED ORDER — AMISULPRIDE (ANTIEMETIC) 5 MG/2ML IV SOLN: 10.0000 mg | Freq: Once | INTRAVENOUS | Status: DC | PRN

## 2022-03-30 MED ORDER — TRANEXAMIC ACID 1000 MG/10ML IV SOLN
INTRAVENOUS | Status: DC | PRN
  Administered 2022-03-30: 2000 mg via TOPICAL

## 2022-03-30 MED ORDER — ACETAMINOPHEN 500 MG PO TABS
1000.0000 mg | ORAL_TABLET | Freq: Once | ORAL | Status: AC
  Administered 2022-03-30: 1000 mg via ORAL
  Filled 2022-03-30: qty 2

## 2022-03-30 MED ORDER — TIZANIDINE HCL 2 MG PO CAPS: 4.0000 mg | ORAL_CAPSULE | Freq: Three times a day (TID) | ORAL | 0 refills | Status: DC | PRN

## 2022-03-30 MED ORDER — CHLORHEXIDINE GLUCONATE 0.12 % MT SOLN
15.0000 mL | Freq: Once | OROMUCOSAL | Status: AC
  Administered 2022-03-30: 15 mL via OROMUCOSAL

## 2022-03-30 MED ORDER — LIDOCAINE HCL (PF) 2 % IJ SOLN
INTRAMUSCULAR | Status: DC | PRN
  Administered 2022-03-30: 60 mL via INTRADERMAL

## 2022-03-30 MED ORDER — LACTATED RINGERS IV BOLUS
250.0000 mL | Freq: Once | INTRAVENOUS | Status: AC
  Administered 2022-03-30: 250 mL via INTRAVENOUS

## 2022-03-30 MED ORDER — ONDANSETRON HCL 4 MG/2ML IJ SOLN
INTRAMUSCULAR | Status: DC | PRN
  Administered 2022-03-30: 4 mg via INTRAVENOUS

## 2022-03-30 MED ORDER — BUPIVACAINE LIPOSOME 1.3 % IJ SUSP
INTRAMUSCULAR | Status: DC | PRN
  Administered 2022-03-30: 20 mL

## 2022-03-30 MED ORDER — EPHEDRINE SULFATE (PRESSORS) 50 MG/ML IJ SOLN
INTRAMUSCULAR | Status: DC | PRN
  Administered 2022-03-30 (×3): 5 mg via INTRAVENOUS
  Administered 2022-03-30: 10 mg via INTRAVENOUS

## 2022-03-30 MED ORDER — BUPIVACAINE LIPOSOME 1.3 % IJ SUSP
INTRAMUSCULAR | Status: AC
  Filled 2022-03-30: qty 20

## 2022-03-30 MED ORDER — MIDAZOLAM HCL 2 MG/2ML IJ SOLN
INTRAMUSCULAR | Status: AC
  Filled 2022-03-30: qty 2

## 2022-03-30 MED ORDER — OXYCODONE HCL 5 MG/5ML PO SOLN: 5.0000 mg | Freq: Once | ORAL | Status: DC | PRN

## 2022-03-30 MED ORDER — OXYCODONE HCL 5 MG PO TABS: 5.0000 mg | ORAL_TABLET | Freq: Once | ORAL | Status: DC | PRN

## 2022-03-30 MED ORDER — OXYCODONE HCL 5 MG PO TABS: 5.0000 mg | ORAL_TABLET | ORAL | 0 refills | Status: AC | PRN

## 2022-03-30 MED ORDER — FENTANYL CITRATE PF 50 MCG/ML IJ SOSY: 25.0000 ug | PREFILLED_SYRINGE | INTRAMUSCULAR | Status: DC | PRN

## 2022-03-30 MED ORDER — OXYCODONE HCL 5 MG PO TABS: 5.0000 mg | ORAL_TABLET | ORAL | 0 refills | Status: DC | PRN

## 2022-03-30 MED ORDER — BUPIVACAINE-EPINEPHRINE (PF) 0.25% -1:200000 IJ SOLN
INTRAMUSCULAR | Status: DC | PRN
  Administered 2022-03-30: 30 mL via PERINEURAL

## 2022-03-30 MED ORDER — LACTATED RINGERS IV BOLUS
500.0000 mL | Freq: Once | INTRAVENOUS | Status: AC
  Administered 2022-03-30: 500 mL via INTRAVENOUS

## 2022-03-30 MED ORDER — EPHEDRINE 5 MG/ML INJ
INTRAVENOUS | Status: AC
  Filled 2022-03-30: qty 5

## 2022-03-30 MED ORDER — TRANEXAMIC ACID 1000 MG/10ML IV SOLN
2000.0000 mg | INTRAVENOUS | Status: DC
  Filled 2022-03-30 (×2): qty 20

## 2022-03-30 MED ORDER — ROPIVACAINE HCL 5 MG/ML IJ SOLN
INTRAMUSCULAR | Status: DC | PRN
  Administered 2022-03-30: 20 mL via PERINEURAL

## 2022-03-30 MED ORDER — PROPOFOL 1000 MG/100ML IV EMUL
INTRAVENOUS | Status: AC
  Filled 2022-03-30: qty 100

## 2022-03-30 MED ORDER — LACTATED RINGERS IV SOLN: INTRAVENOUS | Status: DC

## 2022-03-30 MED ORDER — DEXAMETHASONE SODIUM PHOSPHATE 10 MG/ML IJ SOLN
INTRAMUSCULAR | Status: DC | PRN
  Administered 2022-03-30: 5 mg via INTRAVENOUS

## 2022-03-30 MED ORDER — 0.9 % SODIUM CHLORIDE (POUR BTL) OPTIME
TOPICAL | Status: DC | PRN
  Administered 2022-03-30 (×2): 1000 mL

## 2022-03-30 MED ORDER — SODIUM CHLORIDE (PF) 0.9 % IJ SOLN
INTRAMUSCULAR | Status: AC
  Filled 2022-03-30: qty 50

## 2022-03-30 MED ORDER — TRANEXAMIC ACID-NACL 1000-0.7 MG/100ML-% IV SOLN: 1000.0000 mg | Freq: Once | INTRAVENOUS | Status: DC

## 2022-03-30 MED ORDER — APIXABAN 2.5 MG PO TABS: 2.5000 mg | ORAL_TABLET | Freq: Two times a day (BID) | ORAL | 0 refills | Status: DC

## 2022-03-30 MED ORDER — MIDAZOLAM HCL 2 MG/2ML IJ SOLN
1.0000 mg | INTRAMUSCULAR | Status: DC
  Administered 2022-03-30: 2 mg via INTRAVENOUS
  Filled 2022-03-30: qty 2

## 2022-03-30 MED ORDER — ONDANSETRON HCL 4 MG/2ML IJ SOLN
INTRAMUSCULAR | Status: AC
  Filled 2022-03-30: qty 2

## 2022-03-30 MED ORDER — SODIUM CHLORIDE (PF) 0.9 % IJ SOLN
INTRAMUSCULAR | Status: DC | PRN
  Administered 2022-03-30: 60 mL

## 2022-03-30 MED ORDER — PROPOFOL 500 MG/50ML IV EMUL
INTRAVENOUS | Status: DC | PRN
  Administered 2022-03-30: 101 ug/kg/min via INTRAVENOUS

## 2022-03-30 MED ORDER — TRANEXAMIC ACID-NACL 1000-0.7 MG/100ML-% IV SOLN
1000.0000 mg | INTRAVENOUS | Status: AC
  Administered 2022-03-30: 1000 mg via INTRAVENOUS
  Filled 2022-03-30 (×2): qty 100

## 2022-03-30 SURGICAL SUPPLY — 51 items
ATTUNE MED DOME PAT 38 KNEE (Knees) IMPLANT
ATTUNE PS FEM LT SZ 7 CEM KNEE (Femur) IMPLANT
ATTUNE PSRP INSR SZ7 6 KNEE (Insert) IMPLANT
BAG COUNTER SPONGE SURGICOUNT (BAG) IMPLANT
BAG DECANTER FOR FLEXI CONT (MISCELLANEOUS) ×1 IMPLANT
BAG SPEC THK2 15X12 ZIP CLS (MISCELLANEOUS) ×1
BAG SPNG CNTER NS LX DISP (BAG)
BAG ZIPLOCK 12X15 (MISCELLANEOUS) ×1 IMPLANT
BASE TIBIAL ROT PLAT SZ 7 KNEE (Knees) IMPLANT
BLADE SAG 18X100X1.27 (BLADE) ×1 IMPLANT
BLADE SAW SGTL 11.0X1.19X90.0M (BLADE) ×1 IMPLANT
BLADE SURG SZ10 CARB STEEL (BLADE) ×2 IMPLANT
BNDG CMPR MED 10X6 ELC LF (GAUZE/BANDAGES/DRESSINGS) ×1
BNDG ELASTIC 6X10 VLCR STRL LF (GAUZE/BANDAGES/DRESSINGS) ×1 IMPLANT
BOWL SMART MIX CTS (DISPOSABLE) ×1 IMPLANT
BSPLAT TIB 7 CMNT ROT PLAT STR (Knees) ×1 IMPLANT
CEMENT HV SMART SET (Cement) ×2 IMPLANT
COVER SURGICAL LIGHT HANDLE (MISCELLANEOUS) ×1 IMPLANT
DRAPE INCISE IOBAN 66X45 STRL (DRAPES) IMPLANT
DRAPE U-SHAPE 47X51 STRL (DRAPES) ×1 IMPLANT
DRSG AQUACEL AG ADV 3.5X10 (GAUZE/BANDAGES/DRESSINGS) ×1 IMPLANT
DURAPREP 26ML APPLICATOR (WOUND CARE) ×1 IMPLANT
ELECT REM PT RETURN 15FT ADLT (MISCELLANEOUS) ×1 IMPLANT
GLOVE BIO SURGEON STRL SZ7.5 (GLOVE) ×1 IMPLANT
GLOVE BIO SURGEON STRL SZ8.5 (GLOVE) ×1 IMPLANT
GLOVE BIOGEL PI IND STRL 8 (GLOVE) ×1 IMPLANT
GLOVE BIOGEL PI IND STRL 9 (GLOVE) ×1 IMPLANT
GOWN STRL REUS W/ TWL XL LVL3 (GOWN DISPOSABLE) ×2 IMPLANT
GOWN STRL REUS W/TWL XL LVL3 (GOWN DISPOSABLE) ×2
HANDPIECE INTERPULSE COAX TIP (DISPOSABLE) ×1
HOOD PEEL AWAY T7 (MISCELLANEOUS) ×3 IMPLANT
KIT TURNOVER KIT A (KITS) IMPLANT
NDL HYPO 21X1.5 SAFETY (NEEDLE) ×2 IMPLANT
NEEDLE HYPO 21X1.5 SAFETY (NEEDLE) ×2 IMPLANT
NS IRRIG 1000ML POUR BTL (IV SOLUTION) ×1 IMPLANT
PACK TOTAL KNEE CUSTOM (KITS) ×1 IMPLANT
PIN STEINMAN FIXATION KNEE (PIN) IMPLANT
PROTECTOR NERVE ULNAR (MISCELLANEOUS) ×1 IMPLANT
SET HNDPC FAN SPRY TIP SCT (DISPOSABLE) ×1 IMPLANT
SPIKE FLUID TRANSFER (MISCELLANEOUS) ×3 IMPLANT
SUT VIC AB 1 CTX 36 (SUTURE) ×1
SUT VIC AB 1 CTX36XBRD ANBCTR (SUTURE) ×1 IMPLANT
SUT VIC AB 3-0 CT1 27 (SUTURE) ×3
SUT VIC AB 3-0 CT1 TAPERPNT 27 (SUTURE) ×3 IMPLANT
SYR CONTROL 10ML LL (SYRINGE) ×2 IMPLANT
TIBIAL BASE ROT PLAT SZ 7 KNEE (Knees) ×1 IMPLANT
TRAY CATH INTERMITTENT SS 16FR (CATHETERS) IMPLANT
TRAY FOLEY MTR SLVR 16FR STAT (SET/KITS/TRAYS/PACK) ×1 IMPLANT
TUBE SUCTION HIGH CAP CLEAR NV (SUCTIONS) ×1 IMPLANT
WATER STERILE IRR 1000ML POUR (IV SOLUTION) ×2 IMPLANT
WRAP KNEE MAXI GEL POST OP (GAUZE/BANDAGES/DRESSINGS) ×1 IMPLANT

## 2022-03-30 NOTE — Progress Notes (Signed)
Orthopedic Tech Progress Note Patient Details:  Travis Howell [birth year] FG:[id number]  Ortho Devices Type of Ortho Device: Bone foam zero knee Ortho Device/Splint Interventions: Ordered      [place] 03/30/2022, 1:39 PM

## 2022-03-30 NOTE — Op Note (Signed)
PATIENT ID:      Travis Howell  MRN:     [id number] DOB/AGE:    [birth year] / 72 y.o.       OPERATIVE REPORT   DATE OF PROCEDURE:  03/30/2022      PREOPERATIVE DIAGNOSIS:   LEFT KNEE OSTEOARTHRITIS      Estimated body mass index is 34.46 kg/m as calculated from the following:   Height as of this encounter: 5\' 7"  (1.702 m).   Weight as of this encounter: 99.8 kg.                                                       POSTOPERATIVE DIAGNOSIS:   Same                                                                  PROCEDURE:  Procedure(s): LEFT TOTAL KNEE ARTHROPLASTY Using DepuyAttune RP implants #7L Femur, #7Tibia, 6 mm Attune RP bearing, 41 Patella    SURGEON: Kerin Salen  ASSISTANT:   Kerry Hough. [employer]   (Present and scrubbed throughout the case, critical for assistance with exposure, retraction, instrumentation, and closure.)        ANESTHESIA: Spinal, 20cc Exparel, 50cc 0.25% Marcaine EBL: 350 cc FLUID REPLACEMENT: 1500 cc crystaloid TOURNIQUET: DRAINS: None TRANEXAMIC ACID: 1gm IV, 2gm topical COMPLICATIONS:  None         INDICATIONS FOR PROCEDURE: The patient has  LEFT KNEE OSTEOARTHRITIS, Var deformities, XR shows bone on bone arthritis, lateral subluxation of tibia. Patient has failed all conservative measures including anti-inflammatory medicines, narcotics, attempts at exercise and weight loss, cortisone injections and viscosupplementation.  Risks and benefits of surgery have been discussed, questions answered.   DESCRIPTION OF PROCEDURE: The patient identified by armband, received  IV antibiotics, in the holding area at [facility]. Patient taken to the operating room, appropriate anesthetic monitors were attached, and Spinal anesthesia was  induced. IV Tranexamic acid was given. Lateral post and 2 surefoot positioners applied to the table, the lower extremity was then prepped and draped in usual sterile fashion from the toes to the high thigh. Time-out  procedure was performed. Kerry Hough. [facility] PAC, was present and scrubbed throughout the case, critical for assistance with, positioning, exposure, retraction, instrumentation, and closure.The skin and subcutaneous tissue along the incision was injected with 20 cc of a mixture of 20cc Exparel and 30cc Marcaine 50cc saline solution, using a 21-gauge by 1-1/2 inch needle. We began the operation, with the knee flexed 130 degrees, by making the anterior midline incision starting at handbreadth above the patella going over the patella 1 cm medial to and 4 cm distal to the tibial tubercle. Small bleeders in the skin and the subcutaneous tissue identified and cauterized. Transverse retinaculum was incised and reflected medially and a medial parapatellar arthrotomy was accomplished. the patella was everted and theprepatellar fat pad resected. The superficial medial collateral ligament was then elevated from anterior to posterior along the proximal flare of the tibia and anterior half of the menisci resected. The knee was hyperflexed exposing bone on bone arthritis. Peripheral  and notch osteophytes as well as the cruciate ligaments were then resected. We continued to work our way around posteriorly along the proximal tibia, and externally rotated the tibia subluxing it out from underneath the femur. A McHale PCL retractor was placed through the notch, a lateral Hohmann retractor, and anterolateral small homan retractor placed. We then entered the proximal tibia with the Depuy starter drill in line with the axis of the tibia followed by an intramedullary guide rod and 3-degree posterior slope cutting guide. The tibial cutting guide, was pinned into place allowing resection of 4 mm of bone medially and 12 mm of bone laterally. Satisfied with the tibial resection, we then entered the distal femur 2 mm anterior to the PCL origin with the starter drill, followed by the intramedullary guide rod and applied the distal femoral cutting  guide set at 9 mm, with 5 degrees of valgus. This was pinned along the epicondylar axis. At this point, the distal femoral cut was accomplished without difficulty. We then sized for a #7L femoral component and pinned the chamfer guide in 3 degrees of external rotation. The anterior, posterior, and chamfer cuts were accomplished without difficulty followed by the Attune RP box cutting guide and the box cut. We also removed posterior osteophytes from the posterior femoral condyles. The posterior capsule was injected with Exparel solution. The knee was brought into full extension. We checked our extension gap and fit a 6 mm trial lollipop. Distracting in extension with a lamina spreader,  bleeders in the posterior capsule, Posterior medial and posterior lateral gutter were cauterized.  The transexamic acid-soaked sponge was then placed in the gap of the knee in extension. The knee was flexed 30. The posterior patella cut was accomplished with the 9.5 mm Attune cutting guide, sized for a 41 mm dome, and the fixation pegs drilled.The knee was then once again hyperflexed exposing the proximal tibia. We sized for a # 7 tibial base plate, applied the smokestack and the conical reamer followed by the the Delta fin keel punch. We then hammered into place the Attune RP trial femoral component, drilled the lugs, inserted a  6 mm trial bearing, trial patellar button, and took the knee through range of motion from 0-130 degrees. Medial and lateral ligamentous stability was checked. No thumb pressure was required for patellar Tracking.  All trial components were removed, mating surfaces irrigated with pulse lavage, and dried with suction and sponges. 10 cc of the Exparel solution was applied to the cancellus bone of the patella distal femur and proximal tibia.  After waiting 30 seconds, the bony surfaces were again, dried with sponges. A double batch of DePuy HV cement was mixed and applied to all bony metallic mating surfaces  except for the posterior condyles of the femur itself. In order, we hammered into place the tibial tray and removed excess cement, the femoral component and removed excess cement. The final Attune RP bearing was inserted, and the knee brought to full extension with compression. The patellar button was clamped into place, and excess cement removed. The knee was held at 30 flexion with compression using the second surefoot, while the cement cured. The wound was irrigated out with normal saline solution pulse lavage. The rest of the Exparel was injected into the parapatellar arthrotomy, subcutaneous tissues, and periosteal tissues. The parapatellar arthrotomy was closed with running #1 Vicryl suture. The subcutaneous tissue with 3-0 undyed Vicryl suture, and the skin with running 3-0 SQ vicryl. An Aquacil dressing and Ace wrap  were applied. The patient was taken to recovery room without difficulty.   Kerin Salen 03/30/2022, 12:08 PM

## 2022-03-30 NOTE — Discharge Instructions (Signed)

## 2022-03-30 NOTE — Evaluation (Signed)
Physical Therapy Evaluation Patient Details Name: Travis Howell MRN: [id number] DOB: October 30, 1950 Today's Date: 03/30/2022  History of Present Illness  72 yo male presents to therapy s/p L TKA on 03/30/2022 due to failure of conservative measures. Pt has PMH including but not limited to: A-fib, DM II, R and L TSA, MI, LBP, CAD, HTN, and HDL.  Clinical Impression    Travis Howell is a 72 y.o. male POD 0 s/p L TKA. Patient reports IND with mobility at baseline. Patient is now limited by functional impairments (see PT problem list below) and requires min guard for transfers and gait with RW. Patient was able to ambulate 45 and 40 feet with RW and min guard and cues for safe walker management. Patient educated on safe sequencing for stair mobility, car transfers, pain management including use of CP pt and family demonstrated verbal understanding of safe guarding position for people assisting with mobility. Patient instructed in exercises to facilitate ROM and circulation reviewed with HO provided. Patient will benefit from continued skilled PT interventions to address impairments and progress towards PLOF. Patient has met mobility goals at adequate level for discharge home with family support and pt sating starting OPPT on 3/27; will continue to follow if pt continues acute stay to progress towards Mod I goals.      Recommendations for follow up therapy are one component of a multi-disciplinary discharge planning process, led by the attending physician.  Recommendations may be updated based on patient status, additional functional criteria and insurance authorization.  Follow Up Recommendations       Assistance Recommended at Discharge Intermittent Supervision/Assistance  Patient can return home with the following  A little help with walking and/or transfers;A little help with bathing/dressing/bathroom;Assistance with cooking/housework;Assist for transportation;Help with stairs or ramp for  entrance    Equipment Recommendations Rolling walker (2 wheels) (adjusted and provided at eval)  Recommendations for Other Services       Functional Status Assessment Patient has had a recent decline in their functional status and demonstrates the ability to make significant improvements in function in a reasonable and predictable amount of time.     Precautions / Restrictions Precautions Precautions: Knee Restrictions Weight Bearing Restrictions: No      Mobility  Bed Mobility Overal bed mobility: Needs Assistance Bed Mobility: Supine to Sit     Supine to sit: Min guard     General bed mobility comments: min cues    Transfers Overall transfer level: Needs assistance Equipment used: Rolling walker (2 wheels) Transfers: Sit to/from Stand Sit to Stand: Min guard           General transfer comment: cues for proper UE and AD placement    Ambulation/Gait Ambulation/Gait assistance: Min guard Gait Distance (Feet): 45 Feet Assistive device: Rolling walker (2 wheels) Gait Pattern/deviations: Antalgic (step almost through pattern, minimal L knee flexion) Gait velocity: decreased        Stairs Stairs: Yes Stairs assistance: Min guard Stair Management: Two rails Number of Stairs: 4 (first 2 with B handrail and second with R handrail only) General stair comments: cues for safety and technique with step to pattern  Wheelchair Mobility    Modified Rankin (Stroke Patients Only)       Balance Overall balance assessment: Needs assistance Sitting-balance support: Feet unsupported, Single extremity supported Sitting balance-Leahy Scale: Fair     Standing balance support: No upper extremity supported (static standing and B UE support at [facility] for dynamic/gait) Standing balance-Leahy Scale: Fair  Pertinent Vitals/Pain Pain Assessment Pain Assessment: 0-10 Pain Score: 3  Pain Location: L knee Pain Descriptors /  Indicators: Aching, Constant, Operative site guarding Pain Intervention(s): Limited activity within patient's tolerance, Monitored during session, Premedicated before session, Repositioned, Ice applied    Home Living Family/patient expects to be discharged to:: Private residence Living Arrangements: Spouse/significant other;Other relatives Available Help at Discharge: Family Type of Home: House Home Access: Stairs to enter Entrance Stairs-Rails: Right Entrance Stairs-Number of Steps: 5   Home Layout: One level Home Equipment: Cane - single point      Prior Function Prior Level of Function : Independent/Modified Independent;Driving             Mobility Comments: IND with all ADLs, slef care tasks, IADLs, driving ADLs Comments: pt reports having to assist wife in home setting     Hand Dominance        Extremity/Trunk Assessment        Lower Extremity Assessment Lower Extremity Assessment: LLE deficits/detail LLE Deficits / Details: ankle DF/PF 5/5 and SLT < 10 degree lag LLE Sensation: WNL       Communication   Communication: No difficulties  Cognition Arousal/Alertness: Awake/alert Behavior During Therapy: WFL for tasks assessed/performed Overall Cognitive Status: Within Functional Limits for tasks assessed                                          General Comments      Exercises Total Joint Exercises Ankle Circles/Pumps: AROM, Both, 20 reps Quad Sets: AROM, Left, 5 reps Heel Slides: AROM, Left, 5 reps Hip ABduction/ADduction: AROM, Left, 5 reps Straight Leg Raises: AROM, Left, 5 reps Long Arc Quad: AROM, Left, 5 reps, Seated   Assessment/Plan    PT Assessment Patient needs continued PT services  PT Problem List Decreased strength;Decreased range of motion;Decreased activity tolerance;Decreased balance;Decreased mobility;Decreased knowledge of use of DME;Pain       PT Treatment Interventions DME instruction;Gait training;Stair  training;Functional mobility training;Therapeutic activities;Therapeutic exercise;Balance training;Neuromuscular re-education;Patient/family education;Modalities    PT Goals (Current goals can be found in the Care Plan section)  Acute Rehab PT Goals Patient Stated Goal: to be able to exercise and get moving again PT Goal Formulation: With patient Time For Goal Achievement: 04/13/22 Potential to Achieve Goals: Good    Frequency       Co-evaluation               AM-PAC PT "6 Clicks" Mobility  Outcome Measure Help needed turning from your back to your side while in a flat bed without using bedrails?: None Help needed moving from lying on your back to sitting on the side of a flat bed without using bedrails?: A Little Help needed moving to and from a bed to a chair (including a wheelchair)?: A Little Help needed standing up from a chair using your arms (e.g., wheelchair or bedside chair)?: A Little Help needed to walk in hospital room?: A Little Help needed climbing 3-5 steps with a railing? : A Little 6 Click Score: 19    End of Session Equipment Utilized During Treatment: Gait belt Activity Tolerance: Patient tolerated treatment well;No increased pain Patient left: in chair;with call bell/phone within reach;with family/visitor present Nurse Communication: Mobility status;Other (comment) (progression toward d/c) PT Visit Diagnosis: Unsteadiness on feet (R26.81);Other abnormalities of gait and mobility (R26.89);Muscle weakness (generalized) (M62.81);Pain Pain - Right/Left: Left Pain - part  of body: Knee    Time: [id number] PT Time Calculation (min) (ACUTE ONLY): 34 min   Charges:   PT Evaluation $PT Eval Low Complexity: 1 Low PT Treatments $Gait Training: 8-22 mins        Baird Lyons, PT   Adair Patter 03/30/2022, 4:31 PM

## 2022-03-30 NOTE — Transfer of Care (Signed)
Immediate Anesthesia Transfer of Care Note  Patient: Travis Howell  Procedure(s) Performed: LEFT TOTAL KNEE ARTHROPLASTY (Left: Knee)  Patient Location: PACU  Anesthesia Type:Spinal  Level of Consciousness: awake, alert , oriented, and patient cooperative  Airway & Oxygen Therapy: Patient Spontanous Breathing and Patient connected to face mask oxygen  Post-op Assessment: Report given to RN and Post -op Vital signs reviewed and stable  Post vital signs: Reviewed and stable  Last Vitals:  Vitals Value Taken Time  BP 100/63 03/30/22 1307  Temp 36.3 C 03/30/22 1302  Pulse 70 03/30/22 1308  Resp 16 03/30/22 1308  SpO2 96 % 03/30/22 1308  Vitals shown include unvalidated device data.  Last Pain:  Vitals:   03/30/22 1302  TempSrc: Oral  PainSc:          Complications: No notable events documented.

## 2022-03-30 NOTE — Anesthesia Procedure Notes (Signed)
Anesthesia Regional Block: Adductor canal block   Pre-Anesthetic Checklist: , timeout performed,  Correct Patient, Correct Site, Correct Laterality,  Correct Procedure, Correct Position, site marked,  Risks and benefits discussed,  Surgical consent,  Pre-op evaluation,  At surgeon's request and post-op pain management  Laterality: Left  Prep: chloraprep       Needles:  Injection technique: Single-shot  Needle Type: Echogenic Stimulator Needle     Needle Length: 9cm  Needle Gauge: 21     Additional Needles:   Procedures:,,,, ultrasound used (permanent image in chart),,    Narrative:  Start time: 03/30/2022 9:46 AM End time: 03/30/2022 9:48 AM Injection made incrementally with aspirations every 5 mL.  Performed by: Personally  Anesthesiologist: Nilda Simmer, MD  Additional Notes: Discussed risks and benefits of nerve block including, but not limited to, prolonged and/or permanent nerve injury involving sensory and/or motor function. Monitors were applied and a time-out was performed. The nerve and associated structures were visualized under ultrasound guidance. After negative aspiration, local anesthetic was slowly injected around the nerve. There was no evidence of high pressure during the procedure. There were no paresthesias. VSS remained stable and the patient tolerated the procedure well.

## 2022-03-30 NOTE — Interval H&P Note (Signed)
History and Physical Interval Note:  03/30/2022 9:05 AM  Travis Howell  has presented today for surgery, with the diagnosis of LEFT KNEE OSTEOARTHRITIS.  The various methods of treatment have been discussed with the patient and family. After consideration of risks, benefits and other options for treatment, the patient has consented to  Procedure(s): LEFT TOTAL KNEE ARTHROPLASTY (Left) as a surgical intervention.  The patient's history has been reviewed, patient examined, no change in status, stable for surgery.  I have reviewed the patient's chart and labs.  Questions were answered to the patient's satisfaction.     Kerin Salen

## 2022-03-30 NOTE — Anesthesia Postprocedure Evaluation (Signed)
Anesthesia Post Note  Patient: SABRE MORIOKA  Procedure(s) Performed: LEFT TOTAL KNEE ARTHROPLASTY (Left: Knee)     Patient location during evaluation: PACU Anesthesia Type: Regional, Spinal and MAC Level of consciousness: awake Pain management: pain level controlled Vital Signs Assessment: post-procedure vital signs reviewed and stable Respiratory status: spontaneous breathing, nonlabored ventilation and respiratory function stable Cardiovascular status: blood pressure returned to baseline and stable Postop Assessment: no apparent nausea or vomiting and spinal receding Anesthetic complications: no  No notable events documented.  Last Vitals:  Vitals:   03/30/22 1500 03/30/22 1655  BP: 127/71 115/68  Pulse: (!) 59 60  Resp: 14 15  Temp: 36.7 C 36.6 C  SpO2: 94% 94%    Last Pain:  Vitals:   03/30/22 1655  TempSrc: Oral  PainSc: 0-No pain                 Nilda Simmer

## 2022-03-30 NOTE — Anesthesia Procedure Notes (Signed)
Spinal  Patient location during procedure: OR Start time: 03/30/2022 10:50 AM End time: 03/30/2022 10:52 AM Reason for block: surgical anesthesia Staffing Performed: anesthesiologist  Anesthesiologist: Nilda Simmer, MD Performed by: Nilda Simmer, MD Authorized by: Nilda Simmer, MD   Preanesthetic Checklist Completed: patient identified, IV checked, site marked, risks and benefits discussed, surgical consent, monitors and equipment checked, pre-op evaluation and timeout performed Spinal Block Patient position: sitting Prep: DuraPrep Patient monitoring: blood pressure and continuous pulse ox Approach: midline Location: L3-4 Injection technique: single-shot Needle Needle type: Pencan  Needle gauge: 24 G Needle length: 9 cm Additional Notes Risks and benefits of neuraxial anesthesia including, but not limited to, infection, bleeding, local anesthetic toxicity, headache, hypotension, back pain, block failure, etc. were discussed with the patient. The patient expressed understanding and consented to the procedure. I confirmed that the patient has no bleeding disorders and is not taking blood thinners. I confirmed the patient's last platelet count with the nurse. Monitors were applied. A time-out was performed immediately prior to the procedure. Sterile technique was used throughout the whole procedure.   _1__ attempt(s)

## 2022-03-31 ENCOUNTER — Encounter (HOSPITAL_COMMUNITY): Payer: Self-pay | Admitting: Orthopedic Surgery

## 2022-05-08 ENCOUNTER — Telehealth: Payer: Self-pay

## 2022-05-08 NOTE — Telephone Encounter (Signed)
VMT pt requesting call back reference starting PREP on 05/25/22.

## 2022-06-12 ENCOUNTER — Telehealth: Payer: Self-pay

## 2022-06-12 NOTE — Telephone Encounter (Signed)
VMT pt requesting call back to see if they are ready to start PREP.

## 2022-06-12 NOTE — Telephone Encounter (Signed)
Received call back from a male for Travis Howell.  Reports he has recently joined the gym. Offered if he needs a structured exercise/lifestyle program, to pls let me know.

## 2022-06-17 ENCOUNTER — Other Ambulatory Visit: Payer: Self-pay | Admitting: Cardiology

## 2022-06-17 NOTE — Telephone Encounter (Signed)
This is a A-Fib clinic pt. Pt is being seen for Dr. Elberta Fortis, please address

## 2022-06-20 ENCOUNTER — Other Ambulatory Visit: Payer: Self-pay | Admitting: Nurse Practitioner

## 2022-06-20 DIAGNOSIS — E1169 Type 2 diabetes mellitus with other specified complication: Secondary | ICD-10-CM

## 2022-07-03 ENCOUNTER — Telehealth: Payer: Self-pay | Admitting: Family Medicine

## 2022-07-03 DIAGNOSIS — E1169 Type 2 diabetes mellitus with other specified complication: Secondary | ICD-10-CM

## 2022-07-03 MED ORDER — ACCU-CHEK SOFTCLIX LANCETS MISC: 3 refills | Status: DC

## 2022-07-03 MED ORDER — ACCU-CHEK GUIDE VI STRP: ORAL_STRIP | 3 refills | Status: DC

## 2022-07-03 MED ORDER — ACCU-CHEK GUIDE ME W/DEVICE KIT: PACK | 0 refills | Status: AC

## 2022-07-03 NOTE — Telephone Encounter (Signed)
Pt's significant other called back returning lisa's call. Requested a call back @ [phone]

## 2022-07-03 NOTE — Telephone Encounter (Signed)
Pt called to let Dr. Reece Agar know that his insurance will cover a glucose meter. Pt states him & Dr. Reece Agar as dicussed him getting one. Pt asked could Dr. Reece Agar send a rx for it? Preferred pharmacy is  [id number] [place], [place] - [address]. Call back # [phone]

## 2022-07-03 NOTE — Telephone Encounter (Signed)
Patient  wife returned call stating that there wasn't a model number but the name of the  Glucose monitor was Accucheck.

## 2022-07-03 NOTE — Telephone Encounter (Signed)
E-scribed rx for Accu-Chek Guide Me meter kit, Accu-Chek Softclix lancets and Accu-Chek Guide test strips to [facility]

## 2022-07-03 NOTE — Telephone Encounter (Signed)
Spoke with pt's significant other, [name] (on dpr), notifying her we need the brand name and model of glucometer pt's ins co covers. Says she will have pt find out and call back with info.

## 2022-07-03 NOTE — Telephone Encounter (Signed)
Rtn [name]'s call notifying her rxs below were sent in for glucometer. She verbalizes understanding and expresses her thanks.

## 2022-07-03 NOTE — Addendum Note (Signed)
Addended by: Nanci Pina on: 07/03/2022 12:17 PM   Modules accepted: Orders

## 2022-07-27 ENCOUNTER — Ambulatory Visit (HOSPITAL_COMMUNITY)
Admission: RE | Admit: 2022-07-27 | Discharge: 2022-07-27 | Disposition: A | Discharge status: Home / Self Care | Payer: Medicare Other | Source: Ambulatory Visit | Attending: Physician Assistant | Admitting: Physician Assistant

## 2022-07-27 ENCOUNTER — Encounter (HOSPITAL_COMMUNITY): Payer: Self-pay | Admitting: Physician Assistant

## 2022-07-27 VITALS — BP 130/84 | HR 65 | Ht 67.0 in | Wt 210.0 lb

## 2022-07-27 DIAGNOSIS — I10 Essential (primary) hypertension: Secondary | ICD-10-CM | POA: Insufficient documentation

## 2022-07-27 DIAGNOSIS — Z7901 Long term (current) use of anticoagulants: Secondary | ICD-10-CM | POA: Diagnosis not present

## 2022-07-27 DIAGNOSIS — E669 Obesity, unspecified: Secondary | ICD-10-CM | POA: Insufficient documentation

## 2022-07-27 DIAGNOSIS — I4819 Other persistent atrial fibrillation: Secondary | ICD-10-CM | POA: Insufficient documentation

## 2022-07-27 DIAGNOSIS — E785 Hyperlipidemia, unspecified: Secondary | ICD-10-CM | POA: Diagnosis not present

## 2022-07-27 DIAGNOSIS — D6869 Other thrombophilia: Secondary | ICD-10-CM | POA: Insufficient documentation

## 2022-07-27 DIAGNOSIS — E119 Type 2 diabetes mellitus without complications: Secondary | ICD-10-CM | POA: Insufficient documentation

## 2022-07-27 DIAGNOSIS — Z955 Presence of coronary angioplasty implant and graft: Secondary | ICD-10-CM | POA: Diagnosis not present

## 2022-07-27 DIAGNOSIS — I2510 Atherosclerotic heart disease of native coronary artery without angina pectoris: Secondary | ICD-10-CM | POA: Diagnosis not present

## 2022-07-27 DIAGNOSIS — I4892 Unspecified atrial flutter: Secondary | ICD-10-CM | POA: Insufficient documentation

## 2022-07-27 DIAGNOSIS — Z6832 Body mass index (BMI) 32.0-32.9, adult: Secondary | ICD-10-CM | POA: Insufficient documentation

## 2022-07-27 MED ORDER — APIXABAN 5 MG PO TABS: 5.0000 mg | ORAL_TABLET | Freq: Two times a day (BID) | ORAL | 3 refills | Status: DC
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
## 2022-07-27 NOTE — Addendum Note (Signed)
Encounter addended by: Shona Simpson, RN on: 07/27/2022 11:54 AM  Actions taken: Pharmacy for encounter modified, Order list changed

## 2022-08-03 ENCOUNTER — Encounter: Payer: Self-pay | Admitting: Family Medicine

## 2022-08-03 ENCOUNTER — Ambulatory Visit: Payer: Medicare Other | Admitting: Family Medicine

## 2022-08-03 VITALS — BP 124/74 | HR 61 | Temp 97.5°F | Ht 67.0 in | Wt 208.1 lb

## 2022-08-03 DIAGNOSIS — E669 Obesity, unspecified: Secondary | ICD-10-CM

## 2022-08-03 DIAGNOSIS — Z7984 Long term (current) use of oral hypoglycemic drugs: Secondary | ICD-10-CM | POA: Diagnosis not present

## 2022-08-03 DIAGNOSIS — I2510 Atherosclerotic heart disease of native coronary artery without angina pectoris: Secondary | ICD-10-CM

## 2022-08-03 DIAGNOSIS — J984 Other disorders of lung: Secondary | ICD-10-CM | POA: Diagnosis not present

## 2022-08-03 DIAGNOSIS — J986 Disorders of diaphragm: Secondary | ICD-10-CM

## 2022-08-03 DIAGNOSIS — R0989 Other specified symptoms and signs involving the circulatory and respiratory systems: Secondary | ICD-10-CM

## 2022-08-03 DIAGNOSIS — I4819 Other persistent atrial fibrillation: Secondary | ICD-10-CM

## 2022-08-03 DIAGNOSIS — E1169 Type 2 diabetes mellitus with other specified complication: Secondary | ICD-10-CM | POA: Diagnosis not present

## 2022-08-03 LAB — POCT GLYCOSYLATED HEMOGLOBIN (HGB A1C): Hemoglobin A1C: 6 % — AB (ref 4.0–5.6)

## 2022-08-03 NOTE — Assessment & Plan Note (Addendum)
Congratulated on weight loss to date after knee replacement - continue healthy diet and lifestyle choices.

## 2022-08-03 NOTE — Progress Notes (Signed)
Ph: [phone] Fax: [phone]   Patient ID: Travis Howell, male    DOB: 02-Jun-1950, 72 y.o.   MRN: [id number]  This visit was conducted in person.  BP 124/74   Pulse 61   Temp (!) 97.5 F (36.4 C) (Temporal)   Ht 5\' 7"  (1.702 m)   Wt 208 lb 2 oz (94.4 kg)   SpO2 99%   BMI 32.60 kg/m   BP Readings from Last 3 Encounters:  08/03/22 124/74  07/27/22 130/84  03/30/22 115/68    CC: 6 mo DM f/u visit  Subjective:   HPI: Travis Howell is a 72 y.o. male presenting on 08/03/2022 for Medical Management of Chronic Issues (Here for 6 mo DM f/U. )   14 lb weight loss in the past 4 months since surgery!  S/p L knee replacement surgery by Dr Turner Daniels 03/2022. He's recovered well and is going [facility] [employer] for the past 2 months  - doing walking.   Chronic R hemidiaphragm elevation. 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 H/o fall 55 ft 1982 during [employer] representative work. Wonders if this was what caused R diaphragm. Never had chest pain with this.   DM - does regularly check sugars 120-130 fasting. Compliant with antihyperglycemic regimen which includes: metformin 500mg  daily. Denies low sugars or hypoglycemic symptoms. Denies paresthesias, blurry vision. Last diabetic eye exam 09/2021. Glucometer brand: accuchek guide me meter. Last foot exam: due. DSME: declined. Lab Results  Component Value Date   HGBA1C 6.0 (A) 08/03/2022   Diabetic Foot Exam - Simple   Simple Foot Form Visual Inspection No deformities, no ulcerations, no other skin breakdown bilaterally: Yes Sensation Testing Intact to touch and monofilament testing bilaterally: Yes Pulse Check See comments: Yes Comments No claudication Diminished pulses to BLE Thickened onychomycotic toenails bilaterally    Lab Results  Component Value Date   MICROALBUR 2.4 (H) 02/02/2022    Persistent atrial fibrillation - followed by EP Camitz and Jorja Loa PA on eliquis, toprol XL 75mg  daily s/p  ablation 2023. Saw last week, has been referred to [facility] course (12 wk program).  Known CAD s/p DES to LAD 2012 sees Dr Eldridge Dace.      Relevant past medical, surgical, family and social history reviewed and updated as indicated. Interim medical history since our last visit reviewed. Allergies and medications reviewed and updated. Outpatient Medications Prior to Visit  Medication Sig Dispense Refill   Accu-Chek Softclix Lancets lancets Use as instructed to check blood sugar once a day 100 each 3   albuterol (VENTOLIN HFA) 108 (90 Base) MCG/ACT inhaler USE 2 INHALATIONS BY MOUTH  EVERY 6 HOURS AS NEEDED FOR WHEEZING OR SHORTNESS OF  BREATH 72 g 0   apixaban (ELIQUIS) 5 MG TABS tablet Take 1 tablet (5 mg total) by mouth 2 (two) times daily. 180 tablet 3   Blood Glucose Monitoring Suppl (ACCU-CHEK GUIDE ME) w/Device KIT Use to check blood sugar once a day 1 kit 0   colchicine 0.6 MG tablet TAKE 1 TABLET BY MOUTH DAILY AS  NEEDED FOR GOUT FLARE 90 tablet 0   diphenhydrAMINE-APAP, sleep, (TYLENOL PM EXTRA STRENGTH PO) Take 2 tablets by mouth at bedtime as needed (pain).     ezetimibe (ZETIA) 10 MG tablet Take 1 tablet (10 mg total) by mouth daily. 90 tablet 4   fluticasone (FLONASE) 50 MCG/ACT nasal spray Place 1 spray into both nostrils daily as needed for allergies or rhinitis.  glucose blood (ACCU-CHEK GUIDE) test strip Use as instructed to check blood sugar once a day 100 each 3   metFORMIN (GLUCOPHAGE) 500 MG tablet Take 1 tablet (500 mg total) by mouth daily with breakfast. 90 tablet 4   metoprolol succinate (TOPROL-XL) 100 MG 24 hr tablet TAKE 1 TABLET BY MOUTH AT  BEDTIME 90 tablet 3   Omega-3 Fatty Acids (FISH OIL) 1000 MG CAPS Take 3,000 mg by mouth daily.     OVER THE COUNTER MEDICATION Place 4-5 drops into both ears daily as needed (ear wax). OTC ear drop for wax.     potassium chloride (KLOR-CON) 20 MEQ packet Take 10 mEq by mouth as needed (leg cramps in the summer).      rosuvastatin (CRESTOR) 20 MG tablet TAKE 1 TABLET BY MOUTH DAILY 90 tablet 2   silodosin (RAPAFLO) 8 MG CAPS capsule Take 1 capsule (8 mg total) by mouth daily with breakfast. 90 capsule 4   tizanidine (ZANAFLEX) 2 MG capsule Take 2 capsules (4 mg total) by mouth 3 (three) times daily as needed for muscle spasms. 60 capsule 0   No facility-administered medications prior to visit.     Per HPI unless specifically indicated in ROS section below Review of Systems  Objective:  BP 124/74   Pulse 61   Temp (!) 97.5 F (36.4 C) (Temporal)   Ht 5\' 7"  (1.702 m)   Wt 208 lb 2 oz (94.4 kg)   SpO2 99%   BMI 32.60 kg/m   Wt Readings from Last 3 Encounters:  08/03/22 208 lb 2 oz (94.4 kg)  07/27/22 210 lb (95.3 kg)  03/30/22 220 lb 0.3 oz (99.8 kg)      Physical Exam Vitals and nursing note reviewed.  Constitutional:      Appearance: Normal appearance. He is not ill-appearing.  Eyes:     Extraocular Movements: Extraocular movements intact.     Conjunctiva/sclera: Conjunctivae normal.     Pupils: Pupils are equal, round, and reactive to light.  Cardiovascular:     Rate and Rhythm: Normal rate and regular rhythm.     Pulses: Normal pulses.     Heart sounds: Normal heart sounds. No murmur heard. Pulmonary:     Effort: Pulmonary effort is normal. No respiratory distress.     Breath sounds: No wheezing, rhonchi or rales.     Comments: RLL crackles that somewhat clear with deep cough, as well as chronic RLL diminished breath sounds  Musculoskeletal:     Right lower leg: No edema.     Left lower leg: No edema.     Comments: See HPI for foot exam if done  Skin:    General: Skin is warm and dry.     Findings: No rash.  Neurological:     Mental Status: He is alert.  Psychiatric:        Mood and Affect: Mood normal.        Behavior: Behavior normal.       Results for orders placed or performed in visit on 08/03/22  POCT glycosylated hemoglobin (Hb A1C)  Result Value Ref Range    Hemoglobin A1C 6.0 (A) 4.0 - 5.6 %   HbA1c POC (<> result, manual entry)     HbA1c, POC (prediabetic range)     HbA1c, POC (controlled diabetic range)      Assessment & Plan:   Problem List Items Addressed This Visit     CAD (coronary artery disease)    Appreciate cardiology  care.       Obesity, Class I, BMI 30-34.9    Congratulated on weight loss to date after knee replacement - continue healthy diet and lifestyle choices.       Type 2 diabetes mellitus with other specified complication (HCC) - Primary    Chronic, great control on metformin 500mg  daily - continue this.  Congratulated on weight loss to date as well.       Relevant Orders   POCT glycosylated hemoglobin (Hb A1C) (Completed)   Lung disease   Elevated hemidiaphragm    Chronic R hemidiaphragm elevation present since 2017.       Persistent atrial fibrillation (HCC)    Appreciate EP care.       Diminished pulses in lower extremity    Again noted today.  No claudication symptoms or rest pain. Will continue to monitor.         No orders of the defined types were placed in this encounter.   Orders Placed This Encounter  Procedures   POCT glycosylated hemoglobin (Hb A1C)    Patient Instructions  Good to see you today You are doing well.  Congratulations on weight loss! Continue current medicines.  Return as needed or in 6 months for physical.   Follow up plan: Return in about 6 months (around 02/03/2023) for annual exam, prior fasting for blood work.  Eustaquio Boyden, MD

## 2022-08-03 NOTE — Assessment & Plan Note (Signed)
Appreciate cardiology care.  °

## 2022-08-03 NOTE — Assessment & Plan Note (Signed)
Again noted today.  No claudication symptoms or rest pain. Will continue to monitor.

## 2022-08-03 NOTE — Patient Instructions (Addendum)
Good to see you today You are doing well.  Congratulations on weight loss! Continue current medicines.  Return as needed or in 6 months for physical.

## 2022-08-03 NOTE — Assessment & Plan Note (Signed)
Appreciate EP care.

## 2022-08-03 NOTE — Assessment & Plan Note (Signed)
Chronic R hemidiaphragm elevation present since 2017.

## 2022-08-03 NOTE — Assessment & Plan Note (Addendum)
Chronic, great control on metformin 500mg  daily - continue this.  Congratulated on weight loss to date as well.

## 2022-08-21 ENCOUNTER — Telehealth (HOSPITAL_COMMUNITY): Payer: Self-pay

## 2022-08-21 NOTE — Telephone Encounter (Signed)
Patient's wife called in on on 8/13 stating her husband when he urinates they are seeing a lot of blood. Instructed patient's wife to reach out to his pcp. Communicated with patient's wife and she verbalized understanding.

## 2022-09-16 LAB — FECAL OCCULT BLOOD, GUAIAC: Fecal Occult Blood: POSITIVE
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
## 2022-09-21 ENCOUNTER — Ambulatory Visit: Payer: Medicare Other | Attending: Interventional Cardiology | Admitting: Interventional Cardiology

## 2022-09-21 ENCOUNTER — Encounter: Payer: Self-pay | Admitting: Interventional Cardiology

## 2022-09-21 VITALS — BP 130/70 | HR 61 | Ht 67.0 in | Wt 201.8 lb

## 2022-09-21 DIAGNOSIS — I4819 Other persistent atrial fibrillation: Secondary | ICD-10-CM

## 2022-09-21 DIAGNOSIS — I2510 Atherosclerotic heart disease of native coronary artery without angina pectoris: Secondary | ICD-10-CM | POA: Diagnosis not present

## 2022-09-21 DIAGNOSIS — E782 Mixed hyperlipidemia: Secondary | ICD-10-CM | POA: Diagnosis not present

## 2022-09-21 DIAGNOSIS — D6869 Other thrombophilia: Secondary | ICD-10-CM | POA: Diagnosis not present

## 2022-09-21 DIAGNOSIS — I10 Essential (primary) hypertension: Secondary | ICD-10-CM

## 2022-09-21 NOTE — Patient Instructions (Signed)
Medication Instructions:  Your physician recommends that you continue on your current medications as directed. Please refer to the Current Medication list given to you today.  *If you need a refill on your cardiac medications before your next appointment, please call your pharmacy*   Lab Work: none If you have labs (blood work) drawn today and your tests are completely normal, you will receive your results only by: MyChart Message (if you have MyChart) OR A paper copy in the mail If you have any lab test that is abnormal or we need to change your treatment, we will call you to review the results.   Testing/Procedures: none   Follow-Up: At [facility], you and your health needs are our priority.  As part of our continuing mission to provide you with exceptional heart care, we have created designated Provider Care Teams.  These Care Teams include your primary Cardiologist (physician) and Advanced Practice Providers (APPs -  Physician Assistants and Nurse Practitioners) who all work together to provide you with the care you need, when you need it.  We recommend signing up for the patient portal called "MyChart".  Sign up information is provided on this After Visit Summary.  MyChart is used to connect with patients for Virtual Visits (Telemedicine).  Patients are able to view lab/test results, encounter notes, upcoming appointments, etc.  Non-urgent messages can be sent to your provider as well.   To learn more about what you can do with MyChart, go to [email or web address].    Your next appointment:   12 month(s)  Provider:   Dr Clifton James, Dr Lynnette Caffey, Dr [place], Dr Herbie Baltimore  Other Instructions

## 2022-09-25 ENCOUNTER — Encounter: Payer: Self-pay | Admitting: Family Medicine

## 2022-09-25 ENCOUNTER — Telehealth: Payer: Self-pay | Admitting: Family Medicine

## 2022-09-25 DIAGNOSIS — R195 Other fecal abnormalities: Secondary | ICD-10-CM

## 2022-09-25 NOTE — Telephone Encounter (Signed)
Received message from patient's insurance that he completed an in-home fecal immunochemical screening test which returned positive on September 16, 2022. His last colonoscopy was November 2019 with a plan to repeat in 5 years due to tubular adenoma by Dr. Rhea Belton. Please notify I have placed new referral to GI to discuss repeat colonoscopy.

## 2022-09-25 NOTE — Telephone Encounter (Signed)
Abstracted results.  Lvm asking pt to call back. Need to relay results and Dr Timoteo Expose message.

## 2022-09-25 NOTE — Telephone Encounter (Signed)
Lvm asking pt to call back. Need to relay results and Dr. Timoteo Expose message.

## 2022-09-28 NOTE — Telephone Encounter (Addendum)
Lvm asking pt to call back. Need to relay results and Dr Timoteo Expose message. Mailing a letter.

## 2022-11-26 ENCOUNTER — Ambulatory Visit: Payer: Medicare Other | Admitting: Physician Assistant

## 2022-11-26 ENCOUNTER — Telehealth: Payer: Self-pay

## 2022-11-26 ENCOUNTER — Encounter: Payer: Self-pay | Admitting: Physician Assistant

## 2022-11-26 VITALS — BP 128/70 | HR 53 | Ht 67.0 in | Wt 203.0 lb

## 2022-11-26 DIAGNOSIS — I2510 Atherosclerotic heart disease of native coronary artery without angina pectoris: Secondary | ICD-10-CM

## 2022-11-26 DIAGNOSIS — Z860101 Personal history of adenomatous and serrated colon polyps: Secondary | ICD-10-CM

## 2022-11-26 DIAGNOSIS — Z7901 Long term (current) use of anticoagulants: Secondary | ICD-10-CM

## 2022-11-26 DIAGNOSIS — R195 Other fecal abnormalities: Secondary | ICD-10-CM

## 2022-11-26 MED ORDER — PLENVU 140 G PO SOLR: 1.0000 | Freq: Once | ORAL | 0 refills | Status: AC

## 2022-11-26 NOTE — Patient Instructions (Signed)
You have been scheduled for a colonoscopy. Please follow written instructions given to you at your visit today.   Please pick up your prep supplies at the pharmacy within the next 1-3 days.  If you use inhalers (even only as needed), please bring them with you on the day of your procedure.  DO NOT TAKE 7 DAYS PRIOR TO TEST- Trulicity (dulaglutide) Ozempic, Wegovy (semaglutide) Mounjaro (tirzepatide) Bydureon Bcise (exanatide extended release)  DO NOT TAKE 1 DAY PRIOR TO YOUR TEST Rybelsus (semaglutide) Adlyxin (lixisenatide) Victoza (liraglutide) Byetta (exanatide) ___________________________________________________________________________ _______________________________________________________  If your blood pressure at your visit was 140/90 or greater, please contact your primary care physician to follow up on this.  _______________________________________________________  If you are age 71 or older, your body mass index should be between 23-30. Your Body mass index is 31.79 kg/m. If this is out of the aforementioned range listed, please consider follow up with your Primary Care Provider.  If you are age 42 or younger, your body mass index should be between 19-25. Your Body mass index is 31.79 kg/m. If this is out of the aformentioned range listed, please consider follow up with your Primary Care Provider.   ________________________________________________________  The [facility] providers would like to encourage you to use [facility] to communicate with providers for non-urgent requests or questions.  Due to long hold times on the telephone, sending your provider a message by [facility] may be a faster and more efficient way to get a response.  Please allow 48 business hours for a response.  Please remember that this is for non-urgent requests.  _______________________________________________________

## 2022-11-26 NOTE — Telephone Encounter (Signed)
[facility] Pre-operative Risk Assessment     Request for surgical clearance:     Endoscopy Procedure  What type of surgery is being performed?     Colonoscopy  When is this surgery scheduled?     12/16/2022  What type of clearance is required ?   Pharmacy  Are there any medications that need to be held prior to surgery and how long? Eliquis - 2 days  Practice name and name of physician performing surgery?      [facility]  What is your office phone and fax number?      Phone- [phone]  Fax- [phone]  Anesthesia type (None, local, MAC, general) ?       MAC   Please route your response to [facility]
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
## 2022-11-27 NOTE — Progress Notes (Signed)
Addendum: Reviewed and agree with assessment and management plan. Kadijah Shamoon M, MD  

## 2022-12-01 NOTE — Telephone Encounter (Signed)
Patient with diagnosis of afib on Eliquis for anticoagulation.    Procedure: Colonoscopy Date of procedure: 12/16/22   CHA2DS2-VASc Score = 4   This indicates a 4.8% annual risk of stroke. The patient's score is based upon: CHF History: 0 HTN History: 1 Diabetes History: 1 Stroke History: 0 Vascular Disease History: 1 Age Score: 1 Gender Score: 0      CrCl 100 ml/min Platelet count 158  Per office protocol, patient can hold Eliquis for 2 days prior to procedure.    **This guidance is not considered finalized until pre-operative APP has relayed final recommendations.**

## 2022-12-01 NOTE — Telephone Encounter (Signed)
   Patient Name: Travis Howell  DOB: 1950-07-01 MRN: [id number]  Primary Cardiologist: Lance Muss, MD  Chart reviewed as part of pre-operative protocol coverage. Pre-op clearance already addressed by colleagues in earlier phone notes. To summarize recommendations:  -Per office protocol, patient can hold Eliquis for 2 days prior to procedure.  Please resume when medically safe to do so.   Medical clearance was not requested.   Will route this bundled recommendation to requesting provider via Epic fax function and remove from pre-op pool. Please call with questions.  Sharlene Dory, PA-C 12/01/2022, 3:16 PM

## 2022-12-06 HISTORY — PX: COLONOSCOPY: SHX174

## 2022-12-16 ENCOUNTER — Ambulatory Visit: Payer: Medicare Other | Admitting: Internal Medicine

## 2022-12-16 ENCOUNTER — Encounter: Payer: Self-pay | Admitting: Internal Medicine

## 2022-12-16 VITALS — BP 88/55 | HR 58 | Temp 97.4°F | Resp 13 | Ht 67.0 in | Wt 203.0 lb

## 2022-12-16 DIAGNOSIS — Z860101 Personal history of adenomatous and serrated colon polyps: Secondary | ICD-10-CM

## 2022-12-16 DIAGNOSIS — D123 Benign neoplasm of transverse colon: Secondary | ICD-10-CM | POA: Diagnosis not present

## 2022-12-16 DIAGNOSIS — K5730 Diverticulosis of large intestine without perforation or abscess without bleeding: Secondary | ICD-10-CM | POA: Diagnosis not present

## 2022-12-16 DIAGNOSIS — D122 Benign neoplasm of ascending colon: Secondary | ICD-10-CM

## 2022-12-16 DIAGNOSIS — Z1211 Encounter for screening for malignant neoplasm of colon: Secondary | ICD-10-CM | POA: Diagnosis not present

## 2022-12-16 MED ORDER — SODIUM CHLORIDE 0.9 % IV SOLN: 500.0000 mL | INTRAVENOUS | Status: DC

## 2022-12-16 NOTE — Progress Notes (Signed)
Pt's states no medical or surgical changes since previsit or office visit. 

## 2022-12-16 NOTE — Progress Notes (Signed)
Report to PACU, RN, vss, BBS= Clear.  

## 2022-12-16 NOTE — Op Note (Signed)
[facility] Patient Name: Travis Howell Procedure Date: 12/16/2022 9:19 AM MRN: [id number] Endoscopist: Beverley Fiedler , MD, [id number] Age: 72 Referring MD:  Date of Birth: 06-22-1950 Gender: Male Account #: [id number] Procedure:                Colonoscopy Indications:              Surveillance: Personal history of adenomatous                            polyps on last colonoscopy 5 years ago, Last                            colonoscopy: November 2019 (TA x 1, SSP x 1),                            remote TVA Medicines:                Monitored Anesthesia Care Procedure:                Pre-Anesthesia Assessment:                           - Prior to the procedure, a History and Physical                            was performed, and patient medications and                            allergies were reviewed. The patient's tolerance of                            previous anesthesia was also reviewed. The risks                            and benefits of the procedure and the sedation                            options and risks were discussed with the patient.                            All questions were answered, and informed consent                            was obtained. Prior Anticoagulants: The patient has                            taken Eliquis (apixaban), last dose was 2 days                            prior to procedure. ASA Grade Assessment: II - A                            patient with mild systemic disease. After reviewing  the risks and benefits, the patient was deemed in                            satisfactory condition to undergo the procedure.                           After obtaining informed consent, the colonoscope                            was passed under direct vision. Throughout the                            procedure, the patient's blood pressure, pulse, and                            oxygen saturations were monitored  continuously. The                            CF [id number] #[id number] was introduced through the anus                            and advanced to the cecum, identified by                            appendiceal orifice and ileocecal valve. The                            colonoscopy was performed without difficulty. The                            patient tolerated the procedure well. The quality                            of the bowel preparation was good. The ileocecal                            valve, appendiceal orifice, and rectum were                            photographed. Scope In: 9:21:55 AM Scope Out: 9:39:24 AM Scope Withdrawal Time: 0 hours 14 minutes 33 seconds  Total Procedure Duration: 0 hours 17 minutes 29 seconds  Findings:                 The digital rectal exam was normal.                           A 5 mm polyp was found in the ascending colon. The                            polyp was sessile. The polyp was removed with a                            cold snare. Resection and retrieval were complete.  A 1 mm polyp was found in the ascending colon. The                            polyp was sessile. The polyp was removed with a                            cold biopsy forceps. Resection and retrieval were                            complete.                           A 4 mm polyp was found in the transverse colon. The                            polyp was sessile. The polyp was removed with a                            cold snare. Resection and retrieval were complete.                           Multiple medium-mouthed and small-mouthed                            diverticula were found in the sigmoid colon.                           The retroflexed view of the distal rectum and anal                            verge was normal and showed no anal or rectal                            abnormalities. Complications:            No immediate complications. Estimated  Blood Loss:     Estimated blood loss: none. Impression:               - One 5 mm polyp in the ascending colon, removed                            with a cold snare. Resected and retrieved.                           - One 1 mm polyp in the ascending colon, removed                            with a cold biopsy forceps. Resected and retrieved.                           - One 4 mm polyp in the transverse colon, removed                            with  a cold snare. Resected and retrieved.                           - Moderate diverticulosis in the sigmoid colon.                           - The distal rectum and anal verge are normal on                            retroflexion view. Recommendation:           - Patient has a contact number available for                            emergencies. The signs and symptoms of potential                            delayed complications were discussed with the                            patient. Return to normal activities tomorrow.                            Written discharge instructions were provided to the                            patient.                           - Resume previous diet.                           - Continue present medications.                           - Resume Eliquis (apixaban) at prior dose today.                            Refer to managing physician for further adjustment                            of therapy.                           - Await pathology results.                           - Repeat colonoscopy is recommended for                            surveillance. The colonoscopy date will be                            determined after pathology results from today's                            exam become available  for review. Beverley Fiedler, MD 12/16/2022 9:42:50 AM This report has been signed electronically.

## 2022-12-16 NOTE — Progress Notes (Signed)
See office note dated 11/26/2022 for details and current H&P  Patient presenting for surveillance colonoscopy  Eliquis on hold x 2 days  He remains appropriate for [facility] colonoscopy today

## 2022-12-16 NOTE — Patient Instructions (Signed)
You may resume your Eliquis today at the previous dose.  Resume alll of your previous medications today.  Read all of your discharge instructions.  YOU HAD AN ENDOSCOPIC PROCEDURE TODAY AT THE [facility]:   Refer to the procedure report that was given to you for any specific questions about what was found during the examination.  If the procedure report does not answer your questions, please call your gastroenterologist to clarify.  If you requested that your care partner not be given the details of your procedure findings, then the procedure report has been included in a sealed envelope for you to review at your convenience later.  YOU SHOULD EXPECT: Some feelings of bloating in the abdomen. Passage of more gas than usual.  Walking can help get rid of the air that was put into your GI tract during the procedure and reduce the bloating. If you had a lower endoscopy (such as a colonoscopy or flexible sigmoidoscopy) you may notice spotting of blood in your stool or on the toilet paper. If you underwent a bowel prep for your procedure, you may not have a normal bowel movement for a few days.  Please Note:  You might notice some irritation and congestion in your nose or some drainage.  This is from the oxygen used during your procedure.  There is no need for concern and it should clear up in a day or so.  SYMPTOMS TO REPORT IMMEDIATELY:  Following lower endoscopy (colonoscopy or flexible sigmoidoscopy):  Excessive amounts of blood in the stool  Significant tenderness or worsening of abdominal pains  Swelling of the abdomen that is new, acute  Fever of 100F or higher   For urgent or emergent issues, a gastroenterologist can be reached at any hour by calling [phone]. Do not use MyChart messaging for urgent concerns.    DIET:  We do recommend a small meal at first, but then you may proceed to your regular diet.  Drink plenty of fluids but you should avoid alcoholic beverages for  24 hours.  ACTIVITY:  You should plan to take it easy for the rest of today and you should NOT DRIVE or use heavy machinery until tomorrow (because of the sedation medicines used during the test).    FOLLOW UP: Our staff will call the number listed on your records the next business day following your procedure.  We will call around 7:15- 8:00 am to check on you and address any questions or concerns that you may have regarding the information given to you following your procedure. If we do not reach you, we will leave a message.     If any biopsies were taken you will be contacted by phone or by letter within the next 1-3 weeks.  Please call us at [phone] if you have not heard about the biopsies in 3 weeks.    SIGNATURES/CONFIDENTIALITY: You and/or your care partner have signed paperwork which will be entered into your electronic medical record.  These signatures attest to the fact that that the information above on your After Visit Summary has been reviewed and is understood.  Full responsibility of the confidentiality of this discharge information lies with you and/or your care-partner.

## 2022-12-16 NOTE — Progress Notes (Signed)
Called to room to assist during endoscopic procedure.  Patient ID and intended procedure confirmed with present staff. Received instructions for my participation in the procedure from the performing physician.  

## 2022-12-17 ENCOUNTER — Encounter: Payer: Self-pay | Admitting: Family Medicine

## 2022-12-17 ENCOUNTER — Telehealth: Payer: Self-pay

## 2022-12-17 NOTE — Telephone Encounter (Signed)
  Follow up Call-     12/16/2022    8:30 AM  Call back number  Post procedure Call Back phone  # [phone]  Permission to leave phone message Yes     Patient questions:  Do you have a fever, pain , or abdominal swelling? No. Pain Score  0 *  Have you tolerated food without any problems? Yes.    Have you been able to return to your normal activities? Yes.    Do you have any questions about your discharge instructions: Diet   No. Medications  No. Follow up visit  No.  Do you have questions or concerns about your Care? No.  Actions: * If pain score is 4 or above: No action needed, pain <4.

## 2022-12-18 LAB — SURGICAL PATHOLOGY

## 2022-12-21 ENCOUNTER — Encounter: Payer: Self-pay | Admitting: Internal Medicine

## 2022-12-23 ENCOUNTER — Encounter: Payer: Self-pay | Admitting: Family Medicine

## 2022-12-23 LAB — HM DIABETES EYE EXAM

## 2022-12-23 NOTE — Telephone Encounter (Signed)
 Care team updated and letter sent for eye exam notes.

## 2023-01-18 ENCOUNTER — Other Ambulatory Visit: Payer: Self-pay | Admitting: Orthopedic Surgery

## 2023-01-18 DIAGNOSIS — M25511 Pain in right shoulder: Secondary | ICD-10-CM

## 2023-01-23 ENCOUNTER — Other Ambulatory Visit: Payer: Self-pay | Admitting: Family Medicine

## 2023-01-23 DIAGNOSIS — I4819 Other persistent atrial fibrillation: Secondary | ICD-10-CM

## 2023-01-23 DIAGNOSIS — N402 Nodular prostate without lower urinary tract symptoms: Secondary | ICD-10-CM

## 2023-01-23 DIAGNOSIS — E1169 Type 2 diabetes mellitus with other specified complication: Secondary | ICD-10-CM

## 2023-01-23 DIAGNOSIS — N400 Enlarged prostate without lower urinary tract symptoms: Secondary | ICD-10-CM

## 2023-01-27 ENCOUNTER — Ambulatory Visit: Payer: Medicare Other

## 2023-01-27 VITALS — Ht 67.0 in | Wt 203.0 lb

## 2023-01-27 DIAGNOSIS — Z0000 Encounter for general adult medical examination without abnormal findings: Secondary | ICD-10-CM
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
## 2023-01-27 NOTE — Patient Instructions (Signed)
Mr. Newfield , Thank you for taking time to come for your Medicare Wellness Visit. I appreciate your ongoing commitment to your health goals. Please review the following plan we discussed and let me know if I can assist you in the future.   Referrals/Orders/Follow-Ups/Clinician Recommendations: none  This is a list of the screening recommended for you and due dates:  Health Maintenance  Topic Date Due   Complete foot exam   07/30/2022   COVID-19 Vaccine (5 - 2024-25 season) 09/06/2022   Eye exam for diabetics  09/16/2022   Yearly kidney health urinalysis for diabetes  02/03/2023   Hemoglobin A1C  02/03/2023   Yearly kidney function blood test for diabetes  03/20/2023   Stool Blood Test  12/16/2023   Medicare Annual Wellness Visit  01/27/2024   DTaP/Tdap/Td vaccine (2 - Td or Tdap) 10/09/2026   Colon Cancer Screening  12/16/2027   Pneumonia Vaccine  Completed   Flu Shot  Completed   Hepatitis C Screening  Completed   Zoster (Shingles) Vaccine  Completed   HPV Vaccine  Aged Out    Advanced directives: (In Chart) A copy of your advanced directives are scanned into your chart should your provider ever need it.  Next Medicare Annual Wellness Visit scheduled for next year: Yes 01/28/2024 @ 8:50am televisit

## 2023-01-28 ENCOUNTER — Ambulatory Visit
Admission: RE | Admit: 2023-01-28 | Discharge: 2023-01-28 | Disposition: A | Discharge status: Home / Self Care | Payer: Medicare Other | Source: Ambulatory Visit | Attending: Orthopedic Surgery | Admitting: Orthopedic Surgery

## 2023-01-28 DIAGNOSIS — M25511 Pain in right shoulder: Secondary | ICD-10-CM

## 2023-01-28 MED ORDER — IOPAMIDOL (ISOVUE-M 200) INJECTION 41%
13.0000 mL | Freq: Once | INTRAMUSCULAR | Status: AC
  Administered 2023-01-28: 13 mL

## 2023-01-29 ENCOUNTER — Other Ambulatory Visit (INDEPENDENT_AMBULATORY_CARE_PROVIDER_SITE_OTHER): Payer: Medicare Other

## 2023-01-29 DIAGNOSIS — N402 Nodular prostate without lower urinary tract symptoms: Secondary | ICD-10-CM

## 2023-01-29 DIAGNOSIS — N400 Enlarged prostate without lower urinary tract symptoms: Secondary | ICD-10-CM

## 2023-01-29 DIAGNOSIS — E785 Hyperlipidemia, unspecified: Secondary | ICD-10-CM | POA: Diagnosis not present

## 2023-01-29 DIAGNOSIS — E1169 Type 2 diabetes mellitus with other specified complication: Secondary | ICD-10-CM

## 2023-01-29 DIAGNOSIS — I4819 Other persistent atrial fibrillation: Secondary | ICD-10-CM

## 2023-01-29 LAB — CBC WITH DIFFERENTIAL/PLATELET
Basophils Absolute: 0.1 10*3/uL (ref 0.0–0.1)
Basophils Relative: 1.1 % (ref 0.0–3.0)
Eosinophils Absolute: 0.3 10*3/uL (ref 0.0–0.7)
Eosinophils Relative: 4.8 % (ref 0.0–5.0)
HCT: 39 % (ref 39.0–52.0)
Hemoglobin: 13.5 g/dL (ref 13.0–17.0)
Lymphocytes Relative: 27.6 % (ref 12.0–46.0)
Lymphs Abs: 1.5 10*3/uL (ref 0.7–4.0)
MCHC: 34.5 g/dL (ref 30.0–36.0)
MCV: 99 fL (ref 78.0–100.0)
Monocytes Absolute: 0.4 10*3/uL (ref 0.1–1.0)
Monocytes Relative: 7.2 % (ref 3.0–12.0)
Neutro Abs: 3.2 10*3/uL (ref 1.4–7.7)
Neutrophils Relative %: 59.3 % (ref 43.0–77.0)
Platelets: 167 10*3/uL (ref 150.0–400.0)
RBC: 3.94 Mil/uL — ABNORMAL LOW (ref 4.22–5.81)
RDW: 12.8 % (ref 11.5–15.5)
WBC: 5.4 10*3/uL (ref 4.0–10.5)

## 2023-01-29 LAB — MICROALBUMIN / CREATININE URINE RATIO
Creatinine,U: 107.9 mg/dL
Microalb Creat Ratio: 7 mg/g (ref 0.0–30.0)
Microalb, Ur: 7.6 mg/dL — ABNORMAL HIGH (ref 0.0–1.9)

## 2023-01-29 LAB — COMPREHENSIVE METABOLIC PANEL
ALT: 14 U/L (ref 0–53)
AST: 22 U/L (ref 0–37)
Albumin: 4.4 g/dL (ref 3.5–5.2)
Alkaline Phosphatase: 59 U/L (ref 39–117)
BUN: 16 mg/dL (ref 6–23)
CO2: 29 meq/L (ref 19–32)
Calcium: 8.9 mg/dL (ref 8.4–10.5)
Chloride: 105 meq/L (ref 96–112)
Creatinine, Ser: 0.78 mg/dL (ref 0.40–1.50)
GFR: 89.83 mL/min (ref >60.00)
Glucose, Bld: 148 mg/dL — ABNORMAL HIGH (ref 70–99)
Potassium: 4.4 meq/L (ref 3.5–5.1)
Sodium: 142 meq/L (ref 135–145)
Total Bilirubin: 0.5 mg/dL (ref 0.2–1.2)
Total Protein: 6.8 g/dL (ref 6.0–8.3)

## 2023-01-29 LAB — LIPID PANEL
Cholesterol: 100 mg/dL (ref 0–200)
HDL: 43.1 mg/dL (ref >39.00)
LDL Cholesterol: 45 mg/dL (ref 0–99)
NonHDL: 57.03
Total CHOL/HDL Ratio: 2
Triglycerides: 58 mg/dL (ref 0.0–149.0)
VLDL: 11.6 mg/dL (ref 0.0–40.0)

## 2023-01-29 LAB — HEMOGLOBIN A1C: Hgb A1c MFr Bld: 7 % — ABNORMAL HIGH (ref 4.6–6.5)

## 2023-01-29 LAB — VITAMIN B12: Vitamin B-12: 239 pg/mL (ref 211–911)

## 2023-01-29 LAB — PSA: PSA: 1.03 ng/mL (ref 0.10–4.00)

## 2023-02-05 ENCOUNTER — Other Ambulatory Visit: Payer: Self-pay | Admitting: Family Medicine

## 2023-02-05 ENCOUNTER — Ambulatory Visit (INDEPENDENT_AMBULATORY_CARE_PROVIDER_SITE_OTHER): Payer: Medicare Other | Admitting: Family Medicine

## 2023-02-05 ENCOUNTER — Encounter: Payer: Self-pay | Admitting: Family Medicine

## 2023-02-05 VITALS — BP 128/70 | HR 57 | Temp 97.6°F | Ht 66.5 in | Wt 210.0 lb

## 2023-02-05 DIAGNOSIS — E66811 Obesity, class 1: Secondary | ICD-10-CM

## 2023-02-05 DIAGNOSIS — Z7984 Long term (current) use of oral hypoglycemic drugs: Secondary | ICD-10-CM

## 2023-02-05 DIAGNOSIS — I2510 Atherosclerotic heart disease of native coronary artery without angina pectoris: Secondary | ICD-10-CM

## 2023-02-05 DIAGNOSIS — E538 Deficiency of other specified B group vitamins: Secondary | ICD-10-CM

## 2023-02-05 DIAGNOSIS — Z7189 Other specified counseling: Secondary | ICD-10-CM

## 2023-02-05 DIAGNOSIS — D6869 Other thrombophilia: Secondary | ICD-10-CM

## 2023-02-05 DIAGNOSIS — I10 Essential (primary) hypertension: Secondary | ICD-10-CM | POA: Diagnosis not present

## 2023-02-05 DIAGNOSIS — Z0000 Encounter for general adult medical examination without abnormal findings: Secondary | ICD-10-CM | POA: Diagnosis not present

## 2023-02-05 DIAGNOSIS — R8290 Unspecified abnormal findings in urine: Secondary | ICD-10-CM

## 2023-02-05 DIAGNOSIS — N400 Enlarged prostate without lower urinary tract symptoms: Secondary | ICD-10-CM

## 2023-02-05 DIAGNOSIS — I4819 Other persistent atrial fibrillation: Secondary | ICD-10-CM

## 2023-02-05 DIAGNOSIS — E1169 Type 2 diabetes mellitus with other specified complication: Secondary | ICD-10-CM | POA: Diagnosis not present

## 2023-02-05 DIAGNOSIS — E785 Hyperlipidemia, unspecified: Secondary | ICD-10-CM

## 2023-02-05 LAB — POC URINALSYSI DIPSTICK (AUTOMATED)
Bilirubin, UA: NEGATIVE
Glucose, UA: NEGATIVE
Ketones, UA: NEGATIVE
Nitrite, UA: NEGATIVE
Protein, UA: POSITIVE — AB
Spec Grav, UA: >=1.03 — AB (ref 1.010–1.025)
Urobilinogen, UA: 0.2 U/dL
pH, UA: 5.5 (ref 5.0–8.0)

## 2023-02-05 MED ORDER — CEPHALEXIN 500 MG PO CAPS: 500.0000 mg | ORAL_CAPSULE | Freq: Two times a day (BID) | ORAL | 0 refills | Status: DC

## 2023-02-05 MED ORDER — SILODOSIN 8 MG PO CAPS: 8.0000 mg | ORAL_CAPSULE | Freq: Every day | ORAL | 4 refills | Status: AC

## 2023-02-05 MED ORDER — METFORMIN HCL 500 MG PO TABS: 500.0000 mg | ORAL_TABLET | Freq: Every day | ORAL | 4 refills | Status: AC

## 2023-02-05 MED ORDER — CYANOCOBALAMIN 500 MCG PO TABS: 500.0000 ug | ORAL_TABLET | Freq: Every day | ORAL | Status: AC

## 2023-02-05 MED ORDER — EZETIMIBE 10 MG PO TABS: 10.0000 mg | ORAL_TABLET | Freq: Every day | ORAL | 4 refills | Status: AC

## 2023-02-05 NOTE — Assessment & Plan Note (Signed)
In metformin use. Start b12 daily or MWF.

## 2023-02-05 NOTE — Assessment & Plan Note (Signed)
Chronic, stable on current regimen - continue. The ASCVD Risk score (Arnett DK, et al., 2019) failed to calculate for the following reasons:   Risk score cannot be calculated because patient has a medical history suggesting prior/existing ASCVD

## 2023-02-05 NOTE — Progress Notes (Signed)
Ph: [phone] Fax: [phone]   Patient ID: Travis Howell, male    DOB: 08-25-1950, 73 y.o.   MRN: [id number]  This visit was conducted in person.  BP 128/70   Pulse (!) 57   Temp 97.6 F (36.4 C) (Oral)   Ht 5' 6.5" (1.689 m)   Wt 210 lb (95.3 kg)   SpO2 99%   BMI 33.39 kg/m    CC: CPE Subjective:   HPI: Travis Howell is a 73 y.o. male presenting on 02/05/2023 for Annual Exam (MCR prt 2 [AWV- 01/27/23].)   Saw health advisor last week for medicare wellness visit. Note reviewed.  [name] recently had a stroke affecting speech.   No results found.  Flowsheet Row Clinical Support from 01/27/2023 in [facility] [place]  PHQ-2 Total Score 0          01/24/2023   10:55 AM 02/23/2022   11:03 AM 02/02/2022   10:23 AM 01/26/2022    8:28 AM 01/21/2022    1:22 PM  Fall Risk   Falls in the past year? 0 0 0 0 0  Number falls in past yr:    0 0  Injury with Fall? 0   0 0  Risk for fall due to : No Fall Risks   History of fall(s)   Follow up Education provided;Falls prevention discussed   Falls prevention discussed;Falls evaluation completed    Ongoing chronic R shoulder pain, s/p recent CT arthrogram showing irregular synovitis to [facility] joint and mild infraspinatus partial thickness tear in h/o R total shoulder replacement [facility]). Recently started on meloxicam, Ortho f/u scheduled for next week.   H/o kidney stones, latest R stone s/p extraction with stent placement through urology 2023 ([place]). Denies recent kidney stone trouble recently. Notes darker urine over last few weeks.   Persistent atrial fibrillation - followed by EP on eliquis, toprol XL 75mg  daily s/p ablation 2023.  Known CAD s/p DES to LAD 2012.  Sees cards yearly, last Dr Eldridge Dace 09/2022 and Afib clinic Clint Fenton PA 07/2022.   Chronic R hemidiaphragm elevation. 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 H/o fall 55 ft 1982 during Holiday representative  work.   Notes low energy  Preventative: COLONOSCOPY 12/2022 - multiple TAs, moderate diverticulosis rpt 5 yrs ([place]) Prostate cancer screening - on rapaflo for BPH. voids well. Nocturia x1. No urinary symptoms.  Lung cancer screening - quit smoking 2011, not eligible due to low PY hx.  Flu shot yearly  COVID vaccine Pfizer 02/2019 x2, booster 10/2019, 05/2020  Prevnar-13 2019, pneumovax 10/2018 Tdap 2018 RSV - discussed, declines Zostavax - 2014 Shingrix - 07/2020, 10/2020 Advanced directives received, reviewed 10/2018. HCPOA are SO Wynelle Bourgeois then [employer] (deceased sister). Ok with life support if able to recover within 3-6 months. Will need to verify up to date.  Seat belt use discussed.  Sunscreen use discussed. No changing moles on skin.  Ex smoker - quit 2011. <20 PY hx. SO continues smoking.  Alcohol - none since 03/2020   Dentist Q6 mo  Eye exam yearly ([place])  Bowel - no constipation Bladder - no incontinence   Divorced Lives with SO [place] and her grandson  Occ: retired Psychologist, occupational Edu: [employer] Activity: goes to gym, knee pain limits activity Diet: good water, fruits/vegetables daily     Relevant past medical, surgical, family and social history reviewed and updated as indicated. Interim medical history since our last visit  reviewed. Allergies and medications reviewed and updated. Outpatient Medications Prior to Visit  Medication Sig Dispense Refill   Accu-Chek Softclix Lancets lancets Use as instructed to check blood sugar once a day 100 each 3   apixaban (ELIQUIS) 5 MG TABS tablet Take 1 tablet (5 mg total) by mouth 2 (two) times daily. 180 tablet 3   Blood Glucose Monitoring Suppl (ACCU-CHEK GUIDE ME) w/Device KIT Use to check blood sugar once a day 1 kit 0   fluticasone (FLONASE) 50 MCG/ACT nasal spray Place 1 spray into both nostrils daily as needed for allergies or rhinitis.     glucose blood (ACCU-CHEK GUIDE) test strip Use as instructed to check blood  sugar once a day 100 each 3   metoprolol succinate (TOPROL-XL) 100 MG 24 hr tablet TAKE 1 TABLET BY MOUTH AT  BEDTIME 90 tablet 3   Omega-3 Fatty Acids (FISH OIL) 1000 MG CAPS Take 3,000 mg by mouth daily.     OVER THE COUNTER MEDICATION Place 4-5 drops into both ears daily as needed (ear wax). OTC ear drop for wax.     potassium chloride (KLOR-CON) 20 MEQ packet Take 10 mEq by mouth as needed (leg cramps in the summer).     rosuvastatin (CRESTOR) 20 MG tablet TAKE 1 TABLET BY MOUTH DAILY 90 tablet 2   ezetimibe (ZETIA) 10 MG tablet Take 1 tablet (10 mg total) by mouth daily. 90 tablet 4   metFORMIN (GLUCOPHAGE) 500 MG tablet Take 1 tablet (500 mg total) by mouth daily with breakfast. 90 tablet 4   silodosin (RAPAFLO) 8 MG CAPS capsule Take 1 capsule (8 mg total) by mouth daily with breakfast. 90 capsule 4   No facility-administered medications prior to visit.     Per HPI unless specifically indicated in ROS section below Review of Systems  Constitutional:  Negative for activity change, appetite change, chills, fatigue, fever and unexpected weight change.  HENT:  Negative for hearing loss.   Eyes:  Negative for visual disturbance.  Respiratory:  Negative for cough, chest tightness, shortness of breath and wheezing.   Cardiovascular:  Negative for chest pain, palpitations and leg swelling.  Gastrointestinal:  Negative for abdominal distention, abdominal pain, blood in stool, constipation, diarrhea, nausea and vomiting.  Genitourinary:  Negative for difficulty urinating and hematuria.  Musculoskeletal:  Negative for arthralgias, myalgias and neck pain.  Skin:  Negative for rash.  Neurological:  Negative for dizziness, seizures, syncope and headaches.  Hematological:  Negative for adenopathy. Does not bruise/bleed easily.  Psychiatric/Behavioral:  Negative for dysphoric mood. The patient is not nervous/anxious.     Objective:  BP 128/70   Pulse (!) 57   Temp 97.6 F (36.4 C) (Oral)    Ht 5' 6.5" (1.689 m)   Wt 210 lb (95.3 kg)   SpO2 99%   BMI 33.39 kg/m   Wt Readings from Last 3 Encounters:  02/05/23 210 lb (95.3 kg)  01/27/23 203 lb (92.1 kg)  12/16/22 203 lb (92.1 kg)      Physical Exam Vitals and nursing note reviewed.  Constitutional:      General: He is not in acute distress.    Appearance: Normal appearance. He is well-developed. He is not ill-appearing.  HENT:     Head: Normocephalic and atraumatic.     Right Ear: Hearing, tympanic membrane, ear canal and external ear normal.     Left Ear: Hearing, tympanic membrane, ear canal and external ear normal.     Mouth/Throat:  Mouth: Mucous membranes are moist.     Pharynx: Oropharynx is clear. No oropharyngeal exudate or posterior oropharyngeal erythema.  Eyes:     General: No scleral icterus.    Extraocular Movements: Extraocular movements intact.     Conjunctiva/sclera: Conjunctivae normal.     Pupils: Pupils are equal, round, and reactive to light.     Comments: Chronic anisocoria R>L pupil dilation without ptosis or anhidrosis   Neck:     Thyroid: No thyroid mass or thyromegaly.     Vascular: No carotid bruit.  Cardiovascular:     Rate and Rhythm: Normal rate and regular rhythm.     Pulses: Normal pulses.          Radial pulses are 2+ on the right side and 2+ on the left side.     Heart sounds: Normal heart sounds. No murmur heard. Pulmonary:     Effort: Pulmonary effort is normal. No respiratory distress.     Breath sounds: Normal breath sounds. No wheezing, rhonchi or rales.  Abdominal:     General: Bowel sounds are normal. There is no distension.     Palpations: Abdomen is soft. There is no mass.     Tenderness: There is no abdominal tenderness. There is no guarding or rebound.     Hernia: No hernia is present.  Musculoskeletal:        General: Normal range of motion.     Cervical back: Normal range of motion and neck supple.     Right lower leg: No edema.     Left lower leg: No edema.      Comments: R handed  Lymphadenopathy:     Cervical: No cervical adenopathy.  Skin:    General: Skin is warm and dry.     Findings: No rash.  Neurological:     General: No focal deficit present.     Mental Status: He is alert and oriented to person, place, and time.  Psychiatric:        Mood and Affect: Mood normal.        Behavior: Behavior normal.        Thought Content: Thought content normal.        Judgment: Judgment normal.       Results for orders placed or performed in visit on 02/05/23  POCT Urinalysis Dipstick (Automated)   Collection Time: 02/05/23 10:39 AM  Result Value Ref Range   Color, UA dark yellow    Clarity, UA cloudy    Glucose, UA Negative Negative   Bilirubin, UA negative    Ketones, UA negative    Spec Grav, UA >=1.030 (A) 1.010 - 1.025   Blood, UA 3+    pH, UA 5.5 5.0 - 8.0   Protein, UA Positive (A) Negative   Urobilinogen, UA 0.2 0.2 or 1.0 E.U./dL   Nitrite, UA negative    Leukocytes, UA Large (3+) (A) Negative   Lab Results  Component Value Date   LABURIC 5.9 02/02/2022    Assessment & Plan:   Problem List Items Addressed This Visit     Advanced care planning/counseling discussion (Chronic)   Advanced directives received, reviewed 10/2018. HCPOA are SO Wynelle Bourgeois then [employer] (deceased sister). Ok with life support if able to recover within 3-6 months. Will need to verify up to date.       Health maintenance examination - Primary (Chronic)   Preventative protocols reviewed and updated unless pt declined. Discussed healthy diet and lifestyle.  CAD (coronary artery disease)   Appreciate cardiology care.       Relevant Medications   ezetimibe (ZETIA) 10 MG tablet   HTN (hypertension)   Chronic, stable on current regimen - continue.  Caution with meloxicam use.       Relevant Medications   ezetimibe (ZETIA) 10 MG tablet   Hyperlipidemia associated with type 2 diabetes mellitus (HCC)   Chronic, stable on  current regimen - continue. The ASCVD Risk score (Arnett DK, et al., 2019) failed to calculate for the following reasons:   Risk score cannot be calculated because patient has a medical history suggesting prior/existing ASCVD       Relevant Medications   ezetimibe (ZETIA) 10 MG tablet   metFORMIN (GLUCOPHAGE) 500 MG tablet   BPH (benign prostatic hyperplasia)   Chronic, stable period on rapaflo.  PSA increased but still within normal range - continue yearly PSA Update urinalysis (notes strong dark urine in the mornings) - rec increase water intake.       Relevant Medications   silodosin (RAPAFLO) 8 MG CAPS capsule   Other Relevant Orders   POCT Urinalysis Dipstick (Automated) (Completed)   Obesity, Class I, BMI 30-34.9   Encouraged healthy diet and lifestyle choices to affect sustainable weight loss.       Type 2 diabetes mellitus with other specified complication (HCC)   Chronic, A1c up to 7% but still adequate control.  Encouraged renewed efforts to follow diabetic diet as well as restart regular exercise routine.       Relevant Medications   metFORMIN (GLUCOPHAGE) 500 MG tablet   Persistent atrial fibrillation (HCC)   Chronic, stable period on toprol XL and eliquis. Appreciate afib clinic care.       Relevant Medications   ezetimibe (ZETIA) 10 MG tablet   Hypercoagulable state due to persistent atrial fibrillation (HCC)   Relevant Medications   ezetimibe (ZETIA) 10 MG tablet   Low serum vitamin B12   In metformin use. Start b12 daily or MWF.       Abnormal urinalysis   Abnormal UA suspicious for infection - TNTC RBC, elevated WBC as well. UCx sent.  Will start antibiotic sent to pharmacy.  Was unable to reach patient by phone - will send mychart message.       Relevant Orders   Urine Culture     Meds ordered this encounter  Medications   ezetimibe (ZETIA) 10 MG tablet    Sig: Take 1 tablet (10 mg total) by mouth daily.    Dispense:  90 tablet     Refill:  4   metFORMIN (GLUCOPHAGE) 500 MG tablet    Sig: Take 1 tablet (500 mg total) by mouth daily with breakfast.    Dispense:  90 tablet    Refill:  4   silodosin (RAPAFLO) 8 MG CAPS capsule    Sig: Take 1 capsule (8 mg total) by mouth daily with breakfast.    Dispense:  90 capsule    Refill:  4   cyanocobalamin (VITAMIN B12) 500 MCG tablet    Sig: Take 1 tablet (500 mcg total) by mouth daily.   cephALEXin (KEFLEX) 500 MG capsule    Sig: Take 1 capsule (500 mg total) by mouth 2 (two) times daily.    Dispense:  14 capsule    Refill:  0    Orders Placed This Encounter  Procedures   Urine Culture   POCT Urinalysis Dipstick (Automated)    Patient Instructions  Urinalysis today. Increase water intake Start vitamin b12 MWF or daily.  Restart regular exercise routine for goal weight loss and better sugar control.  Good to see you today Return as needed or in 6 months for diabetes follow up visit    Follow up plan: Return in about 6 months (around 08/05/2023) for follow up visit.  Eustaquio Boyden, MD

## 2023-02-05 NOTE — Assessment & Plan Note (Signed)
Chronic, stable period on toprol XL and eliquis. Appreciate afib clinic care.

## 2023-02-05 NOTE — Patient Instructions (Addendum)
Urinalysis today. Increase water intake Start vitamin b12 MWF or daily.  Restart regular exercise routine for goal weight loss and better sugar control.  Good to see you today Return as needed or in 6 months for diabetes follow up visit

## 2023-02-05 NOTE — Assessment & Plan Note (Signed)
Chronic, A1c up to 7% but still adequate control.  Encouraged renewed efforts to follow diabetic diet as well as restart regular exercise routine.

## 2023-02-05 NOTE — Assessment & Plan Note (Signed)
Advanced directives received, reviewed 10/2018. HCPOA are SO Travis Howell then [employer] (deceased sister). Ok with life support if able to recover within 3-6 months. Will need to verify up to date.

## 2023-02-05 NOTE — Assessment & Plan Note (Signed)
 Preventative protocols reviewed and updated unless pt declined. Discussed healthy diet and lifestyle.

## 2023-02-05 NOTE — Assessment & Plan Note (Signed)
 Encouraged healthy diet and lifestyle choices to affect sustainable weight loss.  ?

## 2023-02-05 NOTE — Assessment & Plan Note (Signed)
Chronic, stable period on rapaflo.  PSA increased but still within normal range - continue yearly PSA Update urinalysis (notes strong dark urine in the mornings) - rec increase water intake.

## 2023-02-05 NOTE — Assessment & Plan Note (Signed)
Appreciate cardiology care.  °

## 2023-02-05 NOTE — Assessment & Plan Note (Addendum)
Chronic, stable on current regimen - continue.  Caution with meloxicam use.

## 2023-02-05 NOTE — Assessment & Plan Note (Signed)
Abnormal UA suspicious for infection - TNTC RBC, elevated WBC as well. UCx sent.  Will start antibiotic sent to pharmacy.  Was unable to reach patient by phone - will send mychart message.

## 2023-02-08 LAB — URINE CULTURE
MICRO NUMBER:: 16025888
SPECIMEN QUALITY:: ADEQUATE

## 2023-02-08 NOTE — Telephone Encounter (Signed)
Copied from CRM [id number]. Topic: Clinical - Lab/Test Results >> Feb 05, 2023  5:09 PM Denese Killings wrote: Reason for CRM: Patient is returning a call for his labs results.

## 2023-02-12 ENCOUNTER — Telehealth: Payer: Self-pay

## 2023-02-12 MED ORDER — SULFAMETHOXAZOLE-TRIMETHOPRIM 800-160 MG PO TABS: 1.0000 | ORAL_TABLET | Freq: Two times a day (BID) | ORAL | 0 refills | Status: DC

## 2023-02-12 NOTE — Addendum Note (Signed)
 Addended by: Claire Crick on: 02/12/2023 06:28 PM   Modules accepted: Orders

## 2023-02-12 NOTE — Telephone Encounter (Signed)
 Called pt back x2, went to voicemail  Initial treatment keflex  500mg  bid x7d.  UCx grew >100k pansensitive Ecoli.   I've sent in a 7d bactrim  course.  Plz call Monday to ensure he started abx. Also recommend he call [facility] to schedule f/u for abnormal UA with blood as he may need another cystoscopy if not done recently.   Lab Results  Component Value Date   NA 142 01/29/2023   CL 105 01/29/2023   K 4.4 01/29/2023   CO2 29 01/29/2023   BUN 16 01/29/2023   CREATININE 0.78 01/29/2023   GFR 89.83 01/29/2023   CALCIUM  8.9 01/29/2023   ALBUMIN 4.4 01/29/2023   GLUCOSE 148 (H) 01/29/2023

## 2023-02-12 NOTE — Telephone Encounter (Signed)
 Copied from CRM [id number]. Topic: Clinical - Lab/Test Results >> Feb 12, 2023  4:55 PM Travis Howell wrote: Reason for CRM: Patient called in requesting to go over his labs. Patient thinks he may need to continue his antibiotics because his symptoms are still there. Patient is requesting a call back today.

## 2023-02-15 ENCOUNTER — Telehealth: Payer: Self-pay | Admitting: Family Medicine

## 2023-02-15 NOTE — Telephone Encounter (Signed)
 Called patient reviewed all information and repeated back to me. Will call if any questions.  Patient picked up meds on Saturday. He will call urology to set up follow up as requested.

## 2023-02-15 NOTE — Telephone Encounter (Signed)
 Spoke with pt. Advised him that I would fax over his most recent urine culture to [facility].

## 2023-02-15 NOTE — Telephone Encounter (Signed)
 Copied from CRM [id number]. Topic: General - Other >> Feb 15, 2023 11:07 AM [place] A wrote: Reason for CRM: Patient called and would like for his abnormal labs to be sent to [facility]'s office today because he has appointment tomorrow

## 2023-04-14 ENCOUNTER — Other Ambulatory Visit: Payer: Self-pay | Admitting: Nurse Practitioner

## 2023-04-14 DIAGNOSIS — E1169 Type 2 diabetes mellitus with other specified complication: Secondary | ICD-10-CM

## 2023-04-30 ENCOUNTER — Other Ambulatory Visit: Payer: Self-pay | Admitting: Physician Assistant

## 2023-05-10 ENCOUNTER — Other Ambulatory Visit: Payer: Self-pay | Admitting: Urology

## 2023-05-10 ENCOUNTER — Telehealth: Payer: Self-pay | Admitting: Cardiology

## 2023-05-10 NOTE — Patient Instructions (Signed)
 SURGICAL WAITING ROOM VISITATION  Patients having surgery or a procedure may have no more than 2 support people in the waiting area - these visitors may rotate.    Children under the age of 42 must have an adult with them who is not the patient.  Due to an increase in RSV and influenza rates and associated hospitalizations, children ages 76 and under may not visit patients in [facility] hospitals.  Visitors with respiratory illnesses are discouraged from visiting and should remain at home.  If the patient needs to stay at the hospital during part of their recovery, the visitor guidelines for inpatient rooms apply. Pre-op nurse will coordinate an appropriate time for 1 support person to accompany patient in pre-op.  This support person may not rotate.    Please refer to the [facility] website for the visitor guidelines for Inpatients (after your surgery is over and you are in a regular room).       Your procedure is scheduled on: 05-18-23    Report to [facility] Main Entrance    Report to admitting at      10:00 AM   Call this number if you have problems the morning of surgery [phone]   Do not eat food :After Midnight.   After Midnight you may have the following liquids until ___0600___ AM/  DAY OF SURGERY   then nothing by mouth  Water                                                             Sports drinks like Gatorade (NO RED)                             If you have questions, please contact your surgeon's office.   FOLLOW ANY ADDITIONAL PRE OP INSTRUCTIONS YOU RECEIVED FROM YOUR SURGEON'S OFFICE!!!     Oral Hygiene is also important to reduce your risk of infection.                                    Remember - BRUSH YOUR TEETH THE MORNING OF SURGERY WITH YOUR REGULAR TOOTHPASTE  DENTURES WILL BE REMOVED PRIOR TO SURGERY PLEASE DO NOT APPLY "Poly grip" OR ADHESIVES!!!   Do NOT smoke after Midnight   Stop all vitamins and herbal supplements 7 days  before surgery.   Take these medicines the morning of surgery with A SIP OF WATER : Sildosin, rosuvastatin , zetia , Cephalexin   DO NOT TAKE ANY ORAL DIABETIC MEDICATIONS DAY OF YOUR SURGERY                                You may not have any metal on your body including hair pins, jewelry, and body piercing             Do not wear  lotions, powders, cologne, or deodorant              Men may shave face and neck.   Do not bring valuables to the hospital. [facility] IS NOT  RESPONSIBLE   FOR VALUABLES.   Contacts, glasses, dentures or bridgework may not be worn into surgery.   Bring small overnight bag day of surgery.   DO NOT BRING YOUR HOME MEDICATIONS TO THE HOSPITAL. PHARMACY WILL DISPENSE MEDICATIONS LISTED ON YOUR MEDICATION LIST TO YOU DURING YOUR ADMISSION IN THE HOSPITAL!    Patients discharged on the day of surgery will not be allowed to drive home.  Someone NEEDS to stay with you for the first 24 hours after anesthesia.   Special Instructions: Bring a copy of your healthcare power of attorney and living will documents the day of surgery if you haven't scanned them before.              Please read over the following fact sheets you were given: IF YOU HAVE QUESTIONS ABOUT YOUR PRE-OP INSTRUCTIONS PLEASE CALL [phone]    If you test positive for Covid or have been in contact with anyone that has tested positive in the last 10 days please notify you surgeon.    [place] - Preparing for Surgery Before surgery, you can play an important role.  Because skin is not sterile, your skin needs to be as free of germs as possible.  You can reduce the number of germs on your skin by washing with CHG (chlorahexidine gluconate) soap before surgery.  CHG is an antiseptic cleaner which kills germs and bonds with the skin to continue killing germs even after washing. Please DO NOT use if you have an allergy to CHG or antibacterial soaps.  If your skin becomes  reddened/irritated stop using the CHG and inform your nurse when you arrive at Short Stay. Do not shave (including legs and underarms) for at least 48 hours prior to the first CHG shower.  You may shave your face/neck. Please follow these instructions carefully:  1.  Shower with CHG Soap the night before surgery and the  morning of Surgery.  2.  If you choose to wash your hair, wash your hair first as usual with your  normal  shampoo.  3.  After you shampoo, rinse your hair and body thoroughly to remove the  shampoo.                           4.  Use CHG as you would any other liquid soap.  You can apply chg directly  to the skin and wash                       Gently with a scrungie or clean washcloth.  5.  Apply the CHG Soap to your body ONLY FROM THE NECK DOWN.   Do not use on face/ open                           Wound or open sores. Avoid contact with eyes, ears mouth and genitals (private parts).                       Wash face,  Genitals (private parts) with your normal soap.             6.  Wash thoroughly, paying special attention to the area where your surgery  will be performed.  7.  Thoroughly rinse your body with warm water  from the neck down.  8.  DO NOT shower/wash with your normal soap after using and  rinsing off  the CHG Soap.                9.  Pat yourself dry with a clean towel.            10.  Wear clean pajamas.            11.  Place clean sheets on your bed the night of your first shower and do not  sleep with pets. Day of Surgery : Do not apply any lotions/deodorants the morning of surgery.  Please wear clean clothes to the hospital/surgery center.  FAILURE TO FOLLOW THESE INSTRUCTIONS MAY RESULT IN THE CANCELLATION OF YOUR SURGERY PATIENT SIGNATURE_________________________________  NURSE SIGNATURE__________________________________  ________________________________________________________________________

## 2023-05-10 NOTE — Progress Notes (Addendum)
 PCP - Claire Crick, MD    LOV 02-05-23 epic Cardiologist - Daphene Dys, MD LOV 09-21-22 epic [place] Will-/ preop call 05-10-23  PPM/ICD -  Device Orders -  Rep Notified -   Chest x-ray - 02-25-22 epic EKG - 07-27-22 epic Stress Test -  ECHO - 2022 epic Cardiac Cath -  CT cardiac-04-18-21 epic  Sleep Study -  CPAP -   Fasting Blood Sugar - 109-120 Checks Blood Sugar __1___ times a week  Blood Thinner Instructions:Eliquis  hold 2 days Aspirin  Instructions:81 mg asa continue  ERAS Protcol - PRE-SURGERY    COVID vaccine -yes  Activity--Able to climb a flight of stairs with no CP or SOB  Anesthesia review: HTN,MI,CAD/stent, DM2, PAF  Patient denies shortness of breath, fever, cough and chest pain at PAT appointment   All instructions explained to the patient, with a verbal understanding of the material. Patient agrees to go over the instructions while at home for a better understanding. Patient also instructed to self quarantine after being tested for COVID-19. The opportunity to ask questions was provided.

## 2023-05-10 NOTE — Telephone Encounter (Signed)
   Pre-operative Risk Assessment    Patient Name: Travis Howell  DOB: [birth year] MRN: [id number]   Date of last office visit: 09/21/22 Date of next office visit: n/a   Request for Surgical Clearance    Procedure:   resection of bladder tumor and urethroscopy with possible biopsies and laser lithotripsy   Date of Surgery:  Clearance 05/18/23                                Surgeon:  Dr. Caryn Clause Group or Practice Name:  [facility] Phone number:  [phone]  Fax number:  [phone]   Type of Clearance Requested:   - Medical  - Pharmacy:  Hold Apixaban  (Eliquis ) 2 days   Type of Anesthesia:  General    Additional requests/questions:      Ryland Cozier   05/10/2023, 12:47 PM

## 2023-05-10 NOTE — Progress Notes (Signed)
 Orders requested via epic in box Dr. Cathi Cluster.

## 2023-05-12 ENCOUNTER — Encounter (HOSPITAL_COMMUNITY): Payer: Self-pay

## 2023-05-12 ENCOUNTER — Encounter (HOSPITAL_COMMUNITY)
Admission: RE | Admit: 2023-05-12 | Discharge: 2023-05-12 | Disposition: A | Discharge status: Home / Self Care | Source: Ambulatory Visit | Attending: Urology | Admitting: Urology

## 2023-05-12 ENCOUNTER — Other Ambulatory Visit: Payer: Self-pay

## 2023-05-12 VITALS — BP 133/84 | HR 53 | Temp 98.1°F | Resp 16 | Ht 67.0 in | Wt 204.0 lb

## 2023-05-12 DIAGNOSIS — I2510 Atherosclerotic heart disease of native coronary artery without angina pectoris: Secondary | ICD-10-CM | POA: Diagnosis not present

## 2023-05-12 DIAGNOSIS — Z01812 Encounter for preprocedural laboratory examination: Secondary | ICD-10-CM | POA: Insufficient documentation

## 2023-05-12 DIAGNOSIS — E1169 Type 2 diabetes mellitus with other specified complication: Secondary | ICD-10-CM | POA: Diagnosis not present

## 2023-05-12 DIAGNOSIS — N401 Enlarged prostate with lower urinary tract symptoms: Secondary | ICD-10-CM | POA: Diagnosis not present

## 2023-05-12 DIAGNOSIS — Z87891 Personal history of nicotine dependence: Secondary | ICD-10-CM | POA: Diagnosis not present

## 2023-05-12 DIAGNOSIS — I480 Paroxysmal atrial fibrillation: Secondary | ICD-10-CM | POA: Diagnosis not present

## 2023-05-12 DIAGNOSIS — N200 Calculus of kidney: Secondary | ICD-10-CM | POA: Diagnosis not present

## 2023-05-12 DIAGNOSIS — R310 Gross hematuria: Secondary | ICD-10-CM | POA: Diagnosis not present

## 2023-05-12 DIAGNOSIS — Z7984 Long term (current) use of oral hypoglycemic drugs: Secondary | ICD-10-CM | POA: Diagnosis not present

## 2023-05-12 DIAGNOSIS — Z955 Presence of coronary angioplasty implant and graft: Secondary | ICD-10-CM | POA: Insufficient documentation

## 2023-05-12 DIAGNOSIS — I10 Essential (primary) hypertension: Secondary | ICD-10-CM | POA: Insufficient documentation

## 2023-05-12 HISTORY — DX: Cardiac arrhythmia, unspecified: I49.9

## 2023-05-12 LAB — HEMOGLOBIN A1C
Hgb A1c MFr Bld: 6.1 % — ABNORMAL HIGH (ref 4.8–5.6)
Mean Plasma Glucose: 128.37 mg/dL

## 2023-05-12 LAB — CBC
HCT: 36.5 % — ABNORMAL LOW (ref 39.0–52.0)
Hemoglobin: 12.1 g/dL — ABNORMAL LOW (ref 13.0–17.0)
MCH: 34.1 pg — ABNORMAL HIGH (ref 26.0–34.0)
MCHC: 33.2 g/dL (ref 30.0–36.0)
MCV: 102.8 fL — ABNORMAL HIGH (ref 80.0–100.0)
Platelets: 174 10*3/uL (ref 150–400)
RBC: 3.55 MIL/uL — ABNORMAL LOW (ref 4.22–5.81)
RDW: 13.2 % (ref 11.5–15.5)
WBC: 4.6 10*3/uL (ref 4.0–10.5)
nRBC: 0 % (ref 0.0–0.2)

## 2023-05-12 LAB — BASIC METABOLIC PANEL WITH GFR
Anion gap: 9 (ref 5–15)
BUN: 13 mg/dL (ref 8–23)
CO2: 22 mmol/L (ref 22–32)
Calcium: 8.9 mg/dL (ref 8.9–10.3)
Chloride: 108 mmol/L (ref 98–111)
Creatinine, Ser: 0.5 mg/dL — ABNORMAL LOW (ref 0.61–1.24)
GFR, Estimated: >60 mL/min (ref >60)
Glucose, Bld: 104 mg/dL — ABNORMAL HIGH (ref 70–99)
Potassium: 4.2 mmol/L (ref 3.5–5.1)
Sodium: 139 mmol/L (ref 135–145)

## 2023-05-12 LAB — GLUCOSE, CAPILLARY: Glucose-Capillary: 130 mg/dL — ABNORMAL HIGH (ref 70–99)

## 2023-05-12 NOTE — Telephone Encounter (Signed)
 Patient with diagnosis of afib on Eliquis  for anticoagulation.    Procedure:  resection of bladder tumor and urethroscopy with possible biopsies and laser lithotripsy  Date of procedure: 05/18/23   CHA2DS2-VASc Score = 4   This indicates a 4.8% annual risk of stroke. The patient's score is based upon: CHF History: 0 HTN History: 1 Diabetes History: 1 Stroke History: 0 Vascular Disease History: 1 Age Score: 1 Gender Score: 0      CrCl 104 ml/min Platelet count 174  Patient has not had an Afib/aflutter ablation within the last 3 months or DCCV within the last 30 days  Per office protocol, patient can hold Eliquis  for 2 days prior to procedure.    **This guidance is not considered finalized until pre-operative APP has relayed final recommendations.**

## 2023-05-13 ENCOUNTER — Telehealth: Payer: Self-pay | Admitting: *Deleted

## 2023-05-13 NOTE — Telephone Encounter (Signed)
 Pt has been scheduled for a tele visit, tomorrow, 05/14/23 3:20.  Consent on file / medications reconciled.

## 2023-05-13 NOTE — Telephone Encounter (Signed)
 1st attempt to reach pt regarding surgical clearance and the need for a tele visit.  Left a message for pt to call back.  Per Pre-Op APP, Levin Reamer, can add pt to schedule Friday 05/14/23.

## 2023-05-13 NOTE — Telephone Encounter (Signed)
 Pt has been scheduled for a tele visit, tomorrow, 05/14/23 3:20.  Consent on file / medications reconciled.    Patient Consent for Virtual Visit        Travis Howell has provided verbal consent on 05/13/2023 for a virtual visit (video or telephone).   CONSENT FOR VIRTUAL VISIT FOR:  Travis Howell  By participating in this virtual visit I agree to the following:  I hereby voluntarily request, consent and authorize [facility] and its employed or contracted physicians, physician assistants, nurse practitioners or other licensed health care professionals (the Practitioner), to provide me with telemedicine health care services (the "Services") as deemed necessary by the treating Practitioner. I acknowledge and consent to receive the Services by the Practitioner via telemedicine. I understand that the telemedicine visit will involve communicating with the Practitioner through live audiovisual communication technology and the disclosure of certain medical information by electronic transmission. I acknowledge that I have been given the opportunity to request an in-person assessment or other available alternative prior to the telemedicine visit and am voluntarily participating in the telemedicine visit.  I understand that I have the right to withhold or withdraw my consent to the use of telemedicine in the course of my care at any time, without affecting my right to future care or treatment, and that the Practitioner or I may terminate the telemedicine visit at any time. I understand that I have the right to inspect all information obtained and/or recorded in the course of the telemedicine visit and may receive copies of available information for a reasonable fee.  I understand that some of the potential risks of receiving the Services via telemedicine include:  Delay or interruption in medical evaluation due to technological equipment failure or disruption; Information transmitted may not be  sufficient (e.g. poor resolution of images) to allow for appropriate medical decision making by the Practitioner; and/or  In rare instances, security protocols could fail, causing a breach of personal health information.  Furthermore, I acknowledge that it is my responsibility to provide information about my medical history, conditions and care that is complete and accurate to the best of my ability. I acknowledge that Practitioner's advice, recommendations, and/or decision may be based on factors not within their control, such as incomplete or inaccurate data provided by me or distortions of diagnostic images or specimens that may result from electronic transmissions. I understand that the practice of medicine is not an exact science and that Practitioner makes no warranties or guarantees regarding treatment outcomes. I acknowledge that a copy of this consent can be made available to me via my patient portal [facility] MyChart), or I can request a printed copy by calling the office of [facility].    I understand that my insurance will be billed for this visit.   I have read or had this consent read to me. I understand the contents of this consent, which adequately explains the benefits and risks of the Services being provided via telemedicine.  I have been provided ample opportunity to ask questions regarding this consent and the Services and have had my questions answered to my satisfaction. I give my informed consent for the services to be provided through the use of telemedicine in my medical care
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
## 2023-05-13 NOTE — Telephone Encounter (Signed)
   Name: Travis Howell  DOB: 10-24-1950  MRN: [id number]  Primary Cardiologist: Avery Bodo, MD   Preoperative team, please contact this patient and set up a phone call appointment for further preoperative risk assessment. Please obtain consent and complete medication review. Thank you for your help.  I confirm that guidance regarding antiplatelet and oral anticoagulation therapy has been completed and, if necessary, noted below.  Per Pharm D, patient may hold Eliquis  for 2 days prior to procedure.    I also confirmed the patient resides in the state of [place] . As per [facility] Medical Board telemedicine laws, the patient must reside in the state in which the provider is licensed.   Morey Ar, NP 05/13/2023, 9:20 AM [facility]

## 2023-05-14 ENCOUNTER — Ambulatory Visit: Attending: Cardiology | Admitting: Student

## 2023-05-14 DIAGNOSIS — Z01810 Encounter for preprocedural cardiovascular examination: Secondary | ICD-10-CM
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
## 2023-05-17 MED ORDER — GENTAMICIN SULFATE 40 MG/ML IJ SOLN
5.0000 mg/kg | INTRAVENOUS | Status: AC
  Administered 2023-05-18: 380 mg via INTRAVENOUS
  Filled 2023-05-17: qty 9.5

## 2023-05-17 NOTE — Anesthesia Preprocedure Evaluation (Addendum)
 Anesthesia Evaluation  Patient identified by MRN, date of birth, ID band Patient awake    Reviewed: Allergy & Precautions, NPO status , Patient's Chart, lab work & pertinent test results, reviewed documented beta blocker date and time   History of Anesthesia Complications Negative for: history of anesthetic complications  Airway Mallampati: II  TM Distance: >3 FB Neck ROM: Full    Dental no notable dental hx. (+) Teeth Intact, Dental Advisory Given   Pulmonary former smoker   Pulmonary exam normal breath sounds clear to auscultation       Cardiovascular hypertension, Pt. on medications and Pt. on home beta blockers (-) angina + CAD, + Past MI (2012) and + Cardiac Stents (PCI with DES to LAD 02/2019)  Normal cardiovascular exam+ dysrhythmias (Ablation 04/2021) Atrial Fibrillation  Rhythm:Regular Rate:Normal  2022 TTE 1. Left ventricular ejection fraction, by estimation, is 50 to 55%. The  left ventricle has low normal function. The left ventricle has no regional  wall motion abnormalities. Left ventricular diastolic function could not  be evaluated.   2. Right ventricular systolic function is normal. The right ventricular  size is normal. There is mildly elevated pulmonary artery systolic  pressure.   3. Left atrial size was moderately dilated.   4. Right atrial size was moderately dilated.   5. The mitral valve is normal in structure. Trivial mitral valve  regurgitation. No evidence of mitral stenosis.   6. The aortic valve is grossly normal. Aortic valve regurgitation is not  visualized. No aortic stenosis is present.   7. The inferior vena cava is dilated in size with <50% respiratory  variability, suggesting right atrial pressure of 15 mmHg.     Neuro/Psych   Anxiety      Neuromuscular disease    GI/Hepatic   Endo/Other  diabetes, Oral Hypoglycemic Agents    Renal/GU Renal diseaseLab Results      Component                 Value               Date                              K                        4.2                 05/12/2023                    CREATININE               0.50 (L)            05/12/2023                   GLUCOSE                  104 (H)             05/12/2023                Musculoskeletal  (+) Arthritis ,    Abdominal   Peds  Hematology Lab Results      Component                Value               Date  WBC                      4.6                 05/12/2023                HGB                      12.1 (L)            05/12/2023                HCT                      36.5 (L)            05/12/2023                   PLT                      174                 05/12/2023              Anesthesia Other Findings All: Atorvastatin , Prevastatin  Reproductive/Obstetrics                             Anesthesia Physical Anesthesia Plan  ASA: 3  Anesthesia Plan: General   Post-op Pain Management:    Induction: Intravenous  PONV Risk Score and Plan: 3 and Treatment may vary due to age or medical condition, Ondansetron , Midazolam  and Dexamethasone   Airway Management Planned: Oral ETT  Additional Equipment: None  Intra-op Plan:   Post-operative Plan: Extubation in OR  Informed Consent: I have reviewed the patients History and Physical, chart, labs and discussed the procedure including the risks, benefits and alternatives for the proposed anesthesia with the patient or authorized representative who has indicated his/her understanding and acceptance.     Dental advisory given  Plan Discussed with: CRNA and Surgeon  Anesthesia Plan Comments: (See PAT note 05/12/2023)       Anesthesia Quick Evaluation
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
## 2023-05-18 ENCOUNTER — Ambulatory Visit (HOSPITAL_COMMUNITY): Payer: Self-pay | Admitting: Physician Assistant

## 2023-05-18 ENCOUNTER — Encounter (HOSPITAL_COMMUNITY): Payer: Self-pay | Admitting: Urology

## 2023-05-18 ENCOUNTER — Encounter (HOSPITAL_COMMUNITY)
Admission: RE | Disposition: A | Discharge status: Home / Self Care | Payer: Self-pay | Source: Home / Self Care | Attending: Urology

## 2023-05-18 ENCOUNTER — Ambulatory Visit (HOSPITAL_COMMUNITY)
Admission: RE | Admit: 2023-05-18 | Discharge: 2023-05-18 | Disposition: A | Discharge status: Home / Self Care | Attending: Urology | Admitting: Urology

## 2023-05-18 ENCOUNTER — Ambulatory Visit (HOSPITAL_BASED_OUTPATIENT_CLINIC_OR_DEPARTMENT_OTHER): Admitting: Anesthesiology

## 2023-05-18 ENCOUNTER — Other Ambulatory Visit: Payer: Self-pay

## 2023-05-18 ENCOUNTER — Ambulatory Visit (HOSPITAL_COMMUNITY)

## 2023-05-18 DIAGNOSIS — N403 Nodular prostate with lower urinary tract symptoms: Secondary | ICD-10-CM | POA: Diagnosis not present

## 2023-05-18 DIAGNOSIS — I10 Essential (primary) hypertension: Secondary | ICD-10-CM | POA: Diagnosis not present

## 2023-05-18 DIAGNOSIS — I2510 Atherosclerotic heart disease of native coronary artery without angina pectoris: Secondary | ICD-10-CM | POA: Insufficient documentation

## 2023-05-18 DIAGNOSIS — I252 Old myocardial infarction: Secondary | ICD-10-CM | POA: Diagnosis not present

## 2023-05-18 DIAGNOSIS — Z7984 Long term (current) use of oral hypoglycemic drugs: Secondary | ICD-10-CM | POA: Diagnosis not present

## 2023-05-18 DIAGNOSIS — I4891 Unspecified atrial fibrillation: Secondary | ICD-10-CM | POA: Insufficient documentation

## 2023-05-18 DIAGNOSIS — E119 Type 2 diabetes mellitus without complications: Secondary | ICD-10-CM | POA: Diagnosis not present

## 2023-05-18 DIAGNOSIS — R310 Gross hematuria: Secondary | ICD-10-CM | POA: Insufficient documentation

## 2023-05-18 DIAGNOSIS — Z87891 Personal history of nicotine dependence: Secondary | ICD-10-CM | POA: Diagnosis not present

## 2023-05-18 DIAGNOSIS — N200 Calculus of kidney: Secondary | ICD-10-CM | POA: Insufficient documentation

## 2023-05-18 DIAGNOSIS — N202 Calculus of kidney with calculus of ureter: Secondary | ICD-10-CM | POA: Diagnosis not present

## 2023-05-18 DIAGNOSIS — Z79899 Other long term (current) drug therapy: Secondary | ICD-10-CM | POA: Insufficient documentation

## 2023-05-18 DIAGNOSIS — E1169 Type 2 diabetes mellitus with other specified complication: Secondary | ICD-10-CM

## 2023-05-18 LAB — GLUCOSE, CAPILLARY: Glucose-Capillary: 130 mg/dL — ABNORMAL HIGH (ref 70–99)

## 2023-05-18 SURGERY — TURBT (TRANSURETHRAL RESECTION OF BLADDER TUMOR)
Anesthesia: General | Laterality: Right

## 2023-05-18 MED ORDER — 0.9 % SODIUM CHLORIDE (POUR BTL) OPTIME
TOPICAL | Status: DC | PRN
  Administered 2023-05-18: 1000 mL

## 2023-05-18 MED ORDER — MIDAZOLAM HCL 5 MG/5ML IJ SOLN
INTRAMUSCULAR | Status: DC | PRN
  Administered 2023-05-18: 2 mg via INTRAVENOUS

## 2023-05-18 MED ORDER — FENTANYL CITRATE (PF) 100 MCG/2ML IJ SOLN
INTRAMUSCULAR | Status: DC | PRN
  Administered 2023-05-18: 100 ug via INTRAVENOUS

## 2023-05-18 MED ORDER — HYOSCYAMINE SULFATE 0.125 MG PO TBDP: 0.1250 mg | ORAL_TABLET | Freq: Four times a day (QID) | ORAL | 0 refills | Status: DC | PRN

## 2023-05-18 MED ORDER — ORAL CARE MOUTH RINSE: 15.0000 mL | Freq: Once | OROMUCOSAL | Status: AC

## 2023-05-18 MED ORDER — LIDOCAINE 2% (20 MG/ML) 5 ML SYRINGE
INTRAMUSCULAR | Status: DC | PRN
  Administered 2023-05-18: 80 mg via INTRAVENOUS

## 2023-05-18 MED ORDER — CHLORHEXIDINE GLUCONATE 0.12 % MT SOLN
15.0000 mL | Freq: Once | OROMUCOSAL | Status: AC
  Administered 2023-05-18: 15 mL via OROMUCOSAL

## 2023-05-18 MED ORDER — MIDAZOLAM HCL 2 MG/2ML IJ SOLN
INTRAMUSCULAR | Status: AC
  Filled 2023-05-18: qty 2

## 2023-05-18 MED ORDER — ROCURONIUM BROMIDE 10 MG/ML (PF) SYRINGE
PREFILLED_SYRINGE | INTRAVENOUS | Status: AC
  Filled 2023-05-18: qty 10

## 2023-05-18 MED ORDER — ACETAMINOPHEN 10 MG/ML IV SOLN: 1000.0000 mg | Freq: Once | INTRAVENOUS | Status: DC | PRN

## 2023-05-18 MED ORDER — DEXAMETHASONE SODIUM PHOSPHATE 10 MG/ML IJ SOLN
INTRAMUSCULAR | Status: DC | PRN
  Administered 2023-05-18: 4 mg via INTRAVENOUS

## 2023-05-18 MED ORDER — SUGAMMADEX SODIUM 200 MG/2ML IV SOLN
INTRAVENOUS | Status: DC | PRN
  Administered 2023-05-18: 200 mg via INTRAVENOUS

## 2023-05-18 MED ORDER — ONDANSETRON HCL 4 MG/2ML IJ SOLN: 4.0000 mg | Freq: Once | INTRAMUSCULAR | Status: DC | PRN

## 2023-05-18 MED ORDER — LACTATED RINGERS IV SOLN: INTRAVENOUS | Status: DC

## 2023-05-18 MED ORDER — ROCURONIUM BROMIDE 10 MG/ML (PF) SYRINGE
PREFILLED_SYRINGE | INTRAVENOUS | Status: DC | PRN
  Administered 2023-05-18: 60 mg via INTRAVENOUS

## 2023-05-18 MED ORDER — DEXMEDETOMIDINE HCL IN NACL 80 MCG/20ML IV SOLN
INTRAVENOUS | Status: DC | PRN
  Administered 2023-05-18 (×2): 4 ug via INTRAVENOUS

## 2023-05-18 MED ORDER — FENTANYL CITRATE (PF) 100 MCG/2ML IJ SOLN
INTRAMUSCULAR | Status: AC
  Filled 2023-05-18: qty 2

## 2023-05-18 MED ORDER — PROPOFOL 10 MG/ML IV BOLUS
INTRAVENOUS | Status: DC | PRN
  Administered 2023-05-18: 20 mg via INTRAVENOUS
  Administered 2023-05-18: 150 mg via INTRAVENOUS

## 2023-05-18 MED ORDER — TAMSULOSIN HCL 0.4 MG PO CAPS: 0.4000 mg | ORAL_CAPSULE | Freq: Every day | ORAL | 0 refills | Status: DC

## 2023-05-18 MED ORDER — HYDROMORPHONE HCL 1 MG/ML IJ SOLN: 0.2500 mg | INTRAMUSCULAR | Status: DC | PRN

## 2023-05-18 MED ORDER — SODIUM CHLORIDE 0.9 % IV SOLN
1.0000 g | INTRAVENOUS | Status: AC
  Administered 2023-05-18: 1 g via INTRAVENOUS
  Filled 2023-05-18: qty 1000

## 2023-05-18 MED ORDER — METHOCARBAMOL 750 MG PO TABS: 750.0000 mg | ORAL_TABLET | Freq: Four times a day (QID) | ORAL | 0 refills | Status: AC

## 2023-05-18 MED ORDER — ONDANSETRON HCL 4 MG/2ML IJ SOLN
INTRAMUSCULAR | Status: DC | PRN
  Administered 2023-05-18: 4 mg via INTRAVENOUS

## 2023-05-18 MED ORDER — OXYCODONE HCL 5 MG/5ML PO SOLN: 5.0000 mg | Freq: Once | ORAL | Status: DC | PRN

## 2023-05-18 MED ORDER — SODIUM CHLORIDE 0.9 % IR SOLN
Status: DC | PRN
  Administered 2023-05-18: 3000 mL

## 2023-05-18 MED ORDER — PHENAZOPYRIDINE HCL 200 MG PO TABS: 200.0000 mg | ORAL_TABLET | Freq: Three times a day (TID) | ORAL | 0 refills | Status: AC | PRN

## 2023-05-18 MED ORDER — IOHEXOL 300 MG/ML  SOLN
INTRAMUSCULAR | Status: DC | PRN
Start: 2023-05-18 — End: 2023-05-18
  Administered 2023-05-18: 50 mL

## 2023-05-18 MED ORDER — OXYCODONE HCL 5 MG PO TABS: 5.0000 mg | ORAL_TABLET | Freq: Once | ORAL | Status: DC | PRN

## 2023-05-18 SURGICAL SUPPLY — 30 items
BAG URO CATCHER STRL LF (MISCELLANEOUS) ×2 IMPLANT
BASKET ZERO TIP NITINOL 2.4FR (BASKET) IMPLANT
BRUSH URET BIOPSY 3F (UROLOGICAL SUPPLIES) IMPLANT
CATH FOLEY 2WAY SLVR 30CC 20FR (CATHETERS) IMPLANT
CATH FOLEY 2WAY SLVR 5CC 18FR (CATHETERS) IMPLANT
CATH URETL OPEN 5X70 (CATHETERS) ×2 IMPLANT
CLOTH BEACON ORANGE TIMEOUT ST (SAFETY) ×2 IMPLANT
DRAPE FOOT SWITCH (DRAPES) ×2 IMPLANT
ELECT REM PT RETURN 15FT ADLT (MISCELLANEOUS) ×2 IMPLANT
EXTRACTOR STONE 1.7FRX115CM (UROLOGICAL SUPPLIES) IMPLANT
FIBER LASER MOSES 200 DFL (Laser) IMPLANT
FIBER LASER MOSES 365 DFL (Laser) IMPLANT
GLOVE BIO SURGEON STRL SZ8 (GLOVE) ×2 IMPLANT
GOWN STRL REUS W/ TWL XL LVL3 (GOWN DISPOSABLE) ×2 IMPLANT
GUIDEWIRE ANG ZIPWIRE 038X150 (WIRE) IMPLANT
GUIDEWIRE STR DUAL SENSOR (WIRE) ×2 IMPLANT
KIT TURNOVER KIT A (KITS) IMPLANT
LOOP CUT BIPOLAR 24F LRG (ELECTROSURGICAL) IMPLANT
MANIFOLD NEPTUNE II (INSTRUMENTS) ×2 IMPLANT
NDL SAFETY ECLIPSE 18X1.5 (NEEDLE) ×2 IMPLANT
PACK CYSTO (CUSTOM PROCEDURE TRAY) ×2 IMPLANT
SHEATH NAVIGATOR HD 11/13X36 (SHEATH) ×2 IMPLANT
SHEATH NAVIGATOR HD 12/14X28 (SHEATH) IMPLANT
SHEATH NAVIGATOR HD 12/14X36 (SHEATH) IMPLANT
STENT URET 6FRX26 CONTOUR (STENTS) IMPLANT
SYR 30ML LL (SYRINGE) IMPLANT
SYRINGE TOOMEY IRRIG 70ML (MISCELLANEOUS) IMPLANT
TRACTIP FLEXIVA PULSE ID 200 (Laser) IMPLANT
TUBING CONNECTING 10 (TUBING) ×2 IMPLANT
TUBING UROLOGY SET (TUBING) ×2 IMPLANT

## 2023-05-18 NOTE — Anesthesia Procedure Notes (Signed)
 Procedure Name: Intubation Date/Time: 05/18/2023 12:37 PM  Performed by: Erleen Hawking, CRNAPre-anesthesia Checklist: Patient identified, Emergency Drugs available, Suction available, Patient being monitored and Timeout performed Patient Re-evaluated:Patient Re-evaluated prior to induction Oxygen Delivery Method: Circle system utilized Preoxygenation: Pre-oxygenation with 100% oxygen Induction Type: IV induction Ventilation: Mask ventilation without difficulty Laryngoscope Size: Mac and 3 Grade View: Grade I Tube type: Oral Tube size: 7.0 mm Number of attempts: 1 Airway Equipment and Method: Stylet Placement Confirmation: ETT inserted through vocal cords under direct vision, positive ETCO2, CO2 detector and breath sounds checked- equal and bilateral Secured at: 22 cm Tube secured with: Tape Dental Injury: Teeth and Oropharynx as per pre-operative assessment  Comments: Intubation by EMS student. ATOI.  Easy intubation.

## 2023-05-18 NOTE — Transfer of Care (Signed)
 Immediate Anesthesia Transfer of Care Note  Patient: Travis Howell  Procedure(s) Performed: Procedure(s): TURBT (TRANSURETHRAL RESECTION OF BLADDER TUMOR) (N/A) CYSTOSCOPY/URETEROSCOPY/HOLMIUM LASER/STENT PLACEMENT (Right)  Patient Location: PACU  Anesthesia Type:General  Level of Consciousness:  sedated, patient cooperative and responds to stimulation  Airway & Oxygen Therapy:Patient Spontanous Breathing and Patient connected to face mask oxgen  Post-op Assessment:  Report given to PACU RN and Post -op Vital signs reviewed and stable  Post vital signs:  Reviewed and stable  Last Vitals:  Vitals:   05/18/23 1011  BP: (!) 125/97  Pulse: (!) 48  Resp: 16  Temp: 36.6 C  SpO2: 98%    Complications: No apparent anesthesia complications

## 2023-05-18 NOTE — Op Note (Signed)
 Preoperative diagnosis: Irregular bladder mucosa, right proximal ureteral thickening  Postoperative diagnosis: Irregular bladder mucosa, right proximal ureteral thickening, right renal pelvis stone  Procedure:  Cystoscopy right ureteroscopy and stone removal Ureteroscopic laser lithotripsy Right ureteral biopsy Right retrograde pyelogram with interpretation Bladder biopsy multifocal totaling 2 cm at the dome and right posterior bladder wall Muscle was present in specimen no evidence of perforation Cystogram with interpretation, no evidence of perforation Right retrograde stent placement 6 x 26 double-J no strings  Surgeon: Aimee Alf MD.  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   patient with a history of gross hematuria cystoscopy demonstrated erythema and also the CT which demonstrated ureteral thickening on the right side he is here today for biopsy as well as evaluation of the right  ureter.  After reviewing the management options for treatment, the patient elected to proceed with the above surgical procedure(s). We have discussed the potential benefits and risks of the procedure, side effects of the proposed treatment, the likelihood of the patient achieving the goals of the procedure, and any potential problems that might occur during the procedure or recuperation. Informed consent has been obtained.   Description of procedure:  The patient was taken to the operating room and general anesthesia was induced.  The patient was placed in the dorsal lithotomy position, prepped and draped in the usual sterile fashion, and preoperative antibiotics were administered. A preoperative time-out was performed.   Cystourethroscopy was performed.  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  The prostatic urethra was short but the patient did have a slightly high bladder neck with mild obstruction.  Pan cystoscopy was then performed demonstrating erythema at the dome of the bladder as well as the area posterior to the right ureteral orifice.  Attention then turned to the right ureteral orifice where a sensor wire was placed and this proper placement was confirmed with fluoroscopy.  Then a single-lumen flexible ureteroscope was placed over the wire into the renal pelvis this was done under fluoroscopy.  Once the renal pelvis pan pyeloscopy was performed there were no mucosal abnormalities or concerning areas for malignancy.  There was a 1 cm stone found in the renal pelvis a 242 m laser was then used to dust the stone using settings of 0.5 J and 25 Hz.  The stone was then dusted to fragments less than 2 mm in size.  This laser was then removed.  Upon further evaluation of the renal pelvis and ureter there was a slight nodular area  near the UPJ a brush biopsy was used to obtain samples from this area.   Cytology was also obtained from the renal pelvis.  Once this was complete a retrograde pyelogram was performed which did demonstrate no filling defects demonstrated that all the calyces have been evaluated there was mild contrast extravasation so because of this decision was made to leave a stent.  The brush biopsy forcep was then sheath and removed and sent for pathology a wire was then replaced through the scope into the renal pelvis upon placement confirmed with fluoroscopy the scope was then removed evaluating the ureter on the way out there were no abnormalities noted to the ureter no concerns for malignancies or strictures.  Once the scope was removed leaving the wire in place the resectoscope was introduced and the bladder.  The bladder was also evaluated with a 70 degree scope confirming there were no tumors near the bladder neck.  The 30 degree scope was then reintroduced and biopsy was taken of the 2 areas of concern at the dome and the right posterior bladder wall.  Specimens were sent all bleeding areas were cauterized there was no perforation noted.   The cystoscope was then removed the sensor wire was then placed through the cystoscope and the cystoscope was then reintroduced into the bladder a 6 x 26 double-J stent was then placed over the wire into the renal pelvis proper placement was confirmed with fluoroscopy of the renal pelvis visualization proper placement within the bladder.  Wellness inspection was made there is no significant bleeding areas there was some bleeding from the prostatic urethra which could be tamponaded by the catheter.  The scope was removed and a 20 [place] two-way catheter was placed into the bladder and 10 cc inflate the balloon.  A cystogram was then performed through the catheter demonstrating no perforation of the bladder.    The bladder was then emptied and the procedure ended.  The patient appeared to tolerate the procedure well and without complications.   The patient was able to be awakened and transferred to the recovery unit in satisfactory condition.   Disposition: The patient has been scheduled for followup in 6 weeks with a renal ultrasound.  The patient will be void trialed on 05/24/2023 stent will be removed at the same time.

## 2023-05-18 NOTE — Anesthesia Postprocedure Evaluation (Signed)
 Anesthesia Post Note  Patient: Travis Howell  Procedure(s) Performed: TURBT (TRANSURETHRAL RESECTION OF BLADDER TUMOR) CYSTOSCOPY/URETEROSCOPY/HOLMIUM LASER/STENT PLACEMENT (Right)     Patient location during evaluation: PACU Anesthesia Type: General Level of consciousness: awake and alert Pain management: pain level controlled Vital Signs Assessment: post-procedure vital signs reviewed and stable Respiratory status: spontaneous breathing, nonlabored ventilation, respiratory function stable and patient connected to nasal cannula oxygen Cardiovascular status: blood pressure returned to baseline and stable Postop Assessment: no apparent nausea or vomiting Anesthetic complications: no  No notable events documented.  Last Vitals:  Vitals:   05/18/23 1430 05/18/23 1445  BP: (!) 142/86 135/75  Pulse: (!) 54 (!) 50  Resp: 16   Temp:  36.6 C  SpO2: 95% 96%    Last Pain:  Vitals:   05/18/23 1445  TempSrc:   PainSc: 0-No pain                 Rosalita Combe

## 2023-05-18 NOTE — Discharge Instructions (Addendum)
 Transurethral Resection of Bladder Tumor (TURBT) or Bladder Biopsy   Definition:  Transurethral Resection of the Bladder Tumor is a surgical procedure used to diagnose and remove tumors within the bladder. TURBT is the most common treatment for early stage bladder cancer.  General instructions:     Your recent bladder surgery requires very little post hospital care but some definite precautions.  Despite the fact that no skin incisions were used, the area around the bladder incisions are raw and covered with scabs to promote healing and prevent bleeding. Certain precautions are needed to insure that the scabs are not disturbed over the next 2-4 weeks while the healing proceeds.  Because the raw surface inside your bladder and the irritating effects of urine you may expect frequency of urination and/or urgency (a stronger desire to urinate) and perhaps even getting up at night more often. This will usually resolve or improve slowly over the healing period. You may see some blood in your urine over the first 6 weeks. Do not be alarmed, even if the urine was clear for a while. Get off your feet and drink lots of fluids until clearing occurs. If you start to pass clots or don't improve call us .  Catheter: Due to the nature of this surgery some patients need to be discharged with a catheter. If you are discharged with a catheter instructions on how to care for the catheter will be attached to your discharge paperwork. You will be called to schedule an appointment to remove the catheter.   Diet:  You may return to your normal diet immediately. Because of the raw surface of your bladder, alcohol, spicy foods, foods high in acid and drinks with caffeine may cause irritation or frequency and should be used in moderation. To keep your urine flowing freely and avoid constipation, drink plenty of fluids during the day (8-10 glasses). Tip: Avoid cranberry juice because it is very acidic.  Activity:  Do not  lift more than 10 LBS for 2 weeks Do not strain with bowel movents.  Your physical activity doesn't need to be restricted. However, if you are very active, you may see some blood in the urine. We suggest that you reduce your activity under the circumstances until the bleeding has stopped.  Bowels:  It is important to keep your bowels regular during the postoperative period. Straining with bowel movements can cause bleeding. A bowel movement every other day is reasonable. Use a mild laxative if needed, such as milk of magnesia 2-3 tablespoons, or 2 Dulcolax tablets. Call if you continue to have problems. If you had been taking narcotics for pain, before, during or after your surgery, you may be constipated. Take a laxative if necessary.    Medication:  You should resume your pre-surgery medications unless told not to. In addition you may be given an antibiotic to prevent or treat infection. Antibiotics are not always necessary. All medication should be taken as prescribed until the bottles are finished unless you are having an unusual reaction to one of the drugs.     DISCHARGE INSTRUCTIONS FOR Ureteroscopy and/or Ureteral Stent Placement  MEDICATIONS:  1.  Robaxin 2. Tamsulosin   3. Hyoscyamine  4, PLEASE RESTART ELIQUIS  2 DAYS AFTER PROCEDURE   ACTIVITY:  1. No strenuous activity x 1week  2. No driving while on narcotic pain medications  3. Drink plenty of water   4. Continue to walk at home - it is normal to see blood in the urine while the stent is  in place, so keep active, but don't over do it.  5. May return to work/school tomorrow or when you feel ready  6. You may experience some pain when urinating in the kidney on the side that was operated on while the stent is in place this is normal  WHAT IS NORMAL TO EXPERIENCE: It is normal to feel the urge to urinate while the stent is in place It is normal to have blood in your urine while the stent is in place  It sometimes can be  normal to have pain in your kidney when you urinate   BATHING:  1. You can shower and we recommend daily showers  2. You have a string coming from your urethra: The stent string is attached to your ureteral stent. Do not pull on this.   DIET: You may return to your normal diet immediately. Because of the raw surface of your bladder, alcohol, spicy foods, acid type foods and drinks with caffeine may cause irritation or frequency and should be used in moderation. To keep your urine flowing freely and to avoid constipation, drink plenty of fluids during the day ( 8-10 glasses ). Tip: Avoid cranberry juice because it is very acidic.  SIGNS/SYMPTOMS TO CALL:  Please call us  if you have a fever greater than 101.5, uncontrolled nausea/vomiting, uncontrolled pain, dizziness, unable to urinate, bloody urine with clots greater than the size of a quarter, chest pain, shortness of breath, leg swelling, leg pain, redness around wound, drainage from wound, or any other concerns or questions.   You can reach us  at [phone].   FOLLOW-UP:  1. You you have been set up for f/u in 6-8 weeks  2. You will have your stent removed in clinic in 1-2 weeks

## 2023-05-19 ENCOUNTER — Encounter (HOSPITAL_COMMUNITY): Payer: Self-pay | Admitting: Urology

## 2023-05-19 LAB — SURGICAL PATHOLOGY

## 2023-05-20 ENCOUNTER — Ambulatory Visit: Payer: Self-pay | Admitting: Family Medicine

## 2023-05-20 LAB — CYTOLOGY - NON PAP

## 2023-07-27 ENCOUNTER — Ambulatory Visit (HOSPITAL_COMMUNITY)
Admission: RE | Admit: 2023-07-27 | Discharge: 2023-07-27 | Disposition: A | Discharge status: Home / Self Care | Source: Ambulatory Visit | Attending: Physician Assistant | Admitting: Physician Assistant

## 2023-07-27 ENCOUNTER — Encounter (HOSPITAL_COMMUNITY): Payer: Self-pay | Admitting: Physician Assistant

## 2023-07-27 VITALS — BP 126/82 | HR 49 | Ht 67.0 in | Wt 203.8 lb

## 2023-07-27 DIAGNOSIS — I4819 Other persistent atrial fibrillation: Secondary | ICD-10-CM | POA: Diagnosis not present

## 2023-07-27 DIAGNOSIS — D6869 Other thrombophilia: Secondary | ICD-10-CM

## 2023-07-27 MED ORDER — METOPROLOL SUCCINATE ER 50 MG PO TB24: 50.0000 mg | ORAL_TABLET | Freq: Every day | ORAL | Status: DC
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
## 2023-07-27 NOTE — Patient Instructions (Signed)
Decrease metoprolol to 50 mg daily

## 2023-08-01 ENCOUNTER — Other Ambulatory Visit: Payer: Self-pay | Admitting: Family Medicine

## 2023-08-01 DIAGNOSIS — E1169 Type 2 diabetes mellitus with other specified complication: Secondary | ICD-10-CM

## 2023-08-06 ENCOUNTER — Encounter: Payer: Self-pay | Admitting: Family Medicine

## 2023-08-06 ENCOUNTER — Ambulatory Visit: Payer: Medicare Other | Admitting: Family Medicine

## 2023-08-06 VITALS — BP 138/60 | HR 66 | Temp 98.6°F | Ht 67.0 in | Wt 202.0 lb

## 2023-08-06 DIAGNOSIS — K838 Other specified diseases of biliary tract: Secondary | ICD-10-CM

## 2023-08-06 DIAGNOSIS — E1169 Type 2 diabetes mellitus with other specified complication: Secondary | ICD-10-CM

## 2023-08-06 DIAGNOSIS — I10 Essential (primary) hypertension: Secondary | ICD-10-CM | POA: Diagnosis not present

## 2023-08-06 DIAGNOSIS — I4819 Other persistent atrial fibrillation: Secondary | ICD-10-CM

## 2023-08-06 DIAGNOSIS — Z7984 Long term (current) use of oral hypoglycemic drugs: Secondary | ICD-10-CM

## 2023-08-06 DIAGNOSIS — N400 Enlarged prostate without lower urinary tract symptoms: Secondary | ICD-10-CM | POA: Diagnosis not present

## 2023-08-06 DIAGNOSIS — N390 Urinary tract infection, site not specified: Secondary | ICD-10-CM

## 2023-08-06 LAB — MICROALBUMIN / CREATININE URINE RATIO
Creatinine,U: 162.7 mg/dL
Microalb Creat Ratio: 19.5 mg/g (ref 0.0–30.0)
Microalb, Ur: 3.2 mg/dL — ABNORMAL HIGH (ref 0.0–1.9)

## 2023-08-06 NOTE — Assessment & Plan Note (Addendum)
 Chronic, great control as evidenced by latest A1c only on metformin . Declines DSME referral - wife does help with diet.  Continue current regimen

## 2023-08-06 NOTE — Assessment & Plan Note (Signed)
 Continues rapaflo . Urology recently started finasteride.

## 2023-08-06 NOTE — Assessment & Plan Note (Addendum)
 Appreciate urology care. Recent TURBT - with benign pathology  Also had R ureteral stone treated. He does note no further UTI symptoms since this intervention. Currently asxs.  Planning return to urology in 2 months.

## 2023-08-06 NOTE — Patient Instructions (Signed)
 You are doing well today Follow up with urology [phone]) about surgery referral. Sugars are doing great!  Return to see me in 6 months for physical

## 2023-08-06 NOTE — Progress Notes (Signed)
 Ph: [phone] Fax: [phone]   Patient ID: Travis Howell, male    DOB: 1950-05-23, 73 y.o.   MRN: [id number]  This visit was conducted in person.  BP 138/60   Pulse 66   Temp 98.6 F (37 C) (Oral)   Ht 5' 7 (1.702 m)   Wt 202 lb (91.6 kg)   SpO2 96%   BMI 31.64 kg/m    CC: 6 mo f/u visit  Subjective:   HPI: Travis Howell is a 73 y.o. male presenting on 08/06/2023 for Medical Management of Chronic Issues   Recently completed house calls screening - had normal circulation screening with [employer].   TURBT 05/2023 ([facility])  Cystoscopy right ureteroscopy and stone removal Ureteroscopic laser lithotripsy Right ureteral biopsy Right retrograde pyelogram with interpretation Bladder biopsy multifocal totaling 2 cm at the dome and right posterior bladder wall Muscle was present in specimen no evidence of perforation Cystogram with interpretation, no evidence of perforation Right retrograde stent placement 6 x 26 double-J no strings Bladder biopsy showed chronic inflammation, no malignancy.  Stent subsequently removed on uro f/u.  Started on finasteride. Considering suppressive abx if recurrence.   Incidental finding on CT scan 04/2023 of subcm hypodensity to ampulla at downstream-most aspect of common bile duct may represent focal prominence of junction of main pacreatic duct and CBD. Attention on f/u, consider contrasted MRI/MRCP. Also with aort ATH and diverticulosis.  He denies fevers/chills, nausea/vomiting, abd pain or other symptoms.  Gallbladder remains.  Was referred to surgery by urology - has not yet scheduled appt.   Persistent atrial fibrillation - followed by afib clinic on eliquis , Toprol  XL 50mg  daily s/p ablation 2023.  Known CAD s/p DES to LAD 2012. also sees cardiology.   HTN - great BP control as evidenced by BP log below.   DM - does regularly check sugars-  see below. Compliant with antihyperglycemic regimen which includes: metformin  500mg   daily. Denies low sugars or hypoglycemic symptoms. Denies paresthesias, blurry vision. Last diabetic eye exam 12/2022. Glucometer brand: accuchek guide. Last foot exam: today. DSME: has declined.  Lab Results  Component Value Date   HGBA1C 6.1 (H) 05/12/2023   Diabetic Foot Exam - Simple   Simple Foot Form Diabetic Foot exam was performed with the following findings: Yes 08/06/2023 10:30 AM  Visual Inspection No deformities, no ulcerations, no other skin breakdown bilaterally: Yes Sensation Testing Intact to touch and monofilament testing bilaterally: Yes Pulse Check Posterior Tibialis and Dorsalis pulse intact bilaterally: Yes Comments No claudication    No results found for: MACKEY CURRENT   Brings vitals log which was reviewed:     Relevant past medical, surgical, family and social history reviewed and updated as indicated. Interim medical history since our last visit reviewed. Allergies and medications reviewed and updated. Outpatient Medications Prior to Visit  Medication Sig Dispense Refill   ACCU-CHEK GUIDE TEST test strip USE AS INSTRUCTED TO CHECK BLOOD SUGAR ONCE A DAY 100 strip 3   Accu-Chek Softclix Lancets lancets USE AS INSTRUCTED TO CHECK BLOOD SUGAR ONCE A DAY 100 each 3   apixaban  (ELIQUIS ) 5 MG TABS tablet Take 1 tablet (5 mg total) by mouth 2 (two) times daily. 180 tablet 3   aspirin  EC 81 MG tablet Take 81 mg by mouth daily. Swallow whole.     Blood Glucose Monitoring Suppl (ACCU-CHEK GUIDE ME) w/Device KIT Use to check blood sugar once a day 1 kit 0   ezetimibe  (ZETIA ) 10 MG tablet  Take 1 tablet (10 mg total) by mouth daily. 90 tablet 4   finasteride (PROSCAR) 5 MG tablet Take 5 mg by mouth daily.     fluticasone (FLONASE) 50 MCG/ACT nasal spray Place 1 spray into both nostrils daily as needed for allergies or rhinitis.     metFORMIN  (GLUCOPHAGE ) 500 MG tablet Take 1 tablet (500 mg total) by mouth daily with breakfast. 90 tablet 4   metoprolol   succinate (TOPROL -XL) 50 MG 24 hr tablet Take 1 tablet (50 mg total) by mouth at bedtime.     Omega-3 Fatty Acids (FISH OIL) 1000 MG CAPS Take 2,000 mg by mouth daily.     potassium chloride (KLOR-CON) 20 MEQ packet Take 10 mEq by mouth 2 (two) times daily.     rosuvastatin  (CRESTOR ) 20 MG tablet TAKE 1 TABLET BY MOUTH DAILY 90 tablet 1   silodosin  (RAPAFLO ) 8 MG CAPS capsule Take 1 capsule (8 mg total) by mouth daily with breakfast. 90 capsule 4   cyanocobalamin  (VITAMIN B12) 500 MCG tablet Take 1 tablet (500 mcg total) by mouth daily. (Patient not taking: Reported on 08/06/2023)     cephALEXin  (KEFLEX ) 500 MG capsule Take 1 capsule (500 mg total) by mouth 2 (two) times daily. 14 capsule 0   hyoscyamine  (ANASPAZ ) 0.125 MG TBDP disintergrating tablet Place 1 tablet (0.125 mg total) under the tongue every 6 (six) hours as needed for up to 20 doses. 20 tablet 0   OVER THE COUNTER MEDICATION Take 2 capsules by mouth daily. Omega Q Plus     tamsulosin  (FLOMAX ) 0.4 MG CAPS capsule Take 1 capsule (0.4 mg total) by mouth daily after supper. 30 capsule 0   No facility-administered medications prior to visit.     Per HPI unless specifically indicated in ROS section below Review of Systems  Objective:  BP 138/60   Pulse 66   Temp 98.6 F (37 C) (Oral)   Ht 5' 7 (1.702 m)   Wt 202 lb (91.6 kg)   SpO2 96%   BMI 31.64 kg/m   Wt Readings from Last 3 Encounters:  08/06/23 202 lb (91.6 kg)  07/27/23 203 lb 12.8 oz (92.4 kg)  05/18/23 204 lb (92.5 kg)      Physical Exam Vitals and nursing note reviewed.  Constitutional:      Appearance: Normal appearance. He is not ill-appearing.  Eyes:     Extraocular Movements: Extraocular movements intact.     Conjunctiva/sclera: Conjunctivae normal.     Pupils: Pupils are equal, round, and reactive to light.  Cardiovascular:     Rate and Rhythm: Normal rate and regular rhythm.     Pulses: Normal pulses.     Heart sounds: Normal heart sounds. No murmur  heard. Pulmonary:     Effort: Pulmonary effort is normal. No respiratory distress.     Breath sounds: Normal breath sounds. No wheezing, rhonchi or rales.  Abdominal:     General: Bowel sounds are normal. There is no distension.     Palpations: Abdomen is soft. There is no mass.     Tenderness: There is no abdominal tenderness. There is no guarding or rebound.     Hernia: No hernia is present.  Musculoskeletal:     Right lower leg: No edema.     Left lower leg: No edema.     Comments: See HPI for foot exam if done  Skin:    General: Skin is warm and dry.     Findings: No rash.  Neurological:     Mental Status: He is alert.  Psychiatric:        Mood and Affect: Mood normal.        Behavior: Behavior normal.       Lab Results  Component Value Date   NA 139 05/12/2023   CL 108 05/12/2023   K 4.2 05/12/2023   CO2 22 05/12/2023   BUN 13 05/12/2023   CREATININE 0.50 (L) 05/12/2023   GFRNONAA >60 05/12/2023   CALCIUM  8.9 05/12/2023   ALBUMIN 4.4 01/29/2023   GLUCOSE 104 (H) 05/12/2023    Lab Results  Component Value Date   ALT 14 01/29/2023   AST 22 01/29/2023   ALKPHOS 59 01/29/2023   BILITOT 0.5 01/29/2023    Lab Results  Component Value Date   WBC 4.6 05/12/2023   HGB 12.1 (L) 05/12/2023   HCT 36.5 (L) 05/12/2023   MCV 102.8 (H) 05/12/2023   PLT 174 05/12/2023    Assessment & Plan:   Problem List Items Addressed This Visit     HTN (hypertension)   Chronic, stable. Continue current regimen.       BPH (benign prostatic hyperplasia)   Continues rapaflo . Urology recently started finasteride.      Type 2 diabetes mellitus with other specified complication (HCC) - Primary   Chronic, great control as evidenced by latest A1c only on metformin . Declines DSME referral - wife does help with diet.  Continue current regimen       Relevant Orders   Microalbumin / creatinine urine ratio   Persistent atrial fibrillation (HCC)   Chronic, stable period on eliquis   with toprol  XL      Recurrent UTI   Appreciate urology care. Recent TURBT - with benign pathology  Also had R ureteral stone treated. He does note no further UTI symptoms since this intervention. Currently asxs.  Planning return to urology in 2 months.       Acquired dilation of bile duct   Presumed, of unclear etiology, noted incidentally on recent CT scan through urology. Pt remains asymptomatic.  Has been referred to gen surg - he will call urology to follow up on this .        No orders of the defined types were placed in this encounter.   Orders Placed This Encounter  Procedures   Microalbumin / creatinine urine ratio    Patient Instructions  You are doing well today Follow up with urology [phone]) about surgery referral. Sugars are doing great!  Return to see me in 6 months for physical   Follow up plan: Return in about 6 months (around 02/06/2024) for annual exam, prior fasting for blood work, medicare wellness visit.  Anton Blas, MD

## 2023-08-06 NOTE — Assessment & Plan Note (Signed)
 Chronic, stable. Continue current regimen.

## 2023-08-06 NOTE — Assessment & Plan Note (Addendum)
 Presumed, of unclear etiology, noted incidentally on recent CT scan through urology. Pt remains asymptomatic.  Has been referred to gen surg - he will call urology to follow up on this .

## 2023-08-06 NOTE — Assessment & Plan Note (Signed)
 Chronic, stable period on eliquis  with toprol  XL

## 2023-08-07 ENCOUNTER — Ambulatory Visit: Payer: Self-pay | Admitting: Family Medicine

## 2023-08-17 ENCOUNTER — Other Ambulatory Visit (HOSPITAL_COMMUNITY): Payer: Self-pay | Admitting: Physician Assistant

## 2023-10-11 ENCOUNTER — Other Ambulatory Visit: Payer: Self-pay | Admitting: Nurse Practitioner

## 2023-10-11 DIAGNOSIS — E1169 Type 2 diabetes mellitus with other specified complication: Secondary | ICD-10-CM

## 2023-10-23 NOTE — Progress Notes (Unsigned)
 Cardiology Office Note:   Date:  10/27/2023  ID:  Travis Howell, DOB 1950/04/26, MRN [id number] PCP:  Rilla Baller, MD  [facility] Providers Cardiologist:  Wendel Haws, MD Referring MD: Rilla Baller, MD  Chief Complaint/Reason for Referral: Follow-up CAD ASSESSMENT:    1. Acute coronary syndrome (HCC)   2. Type 2 diabetes mellitus without complication, without long-term current use of insulin  (HCC)   3. Hypertension associated with diabetes (HCC)   4. Hyperlipidemia associated with type 2 diabetes mellitus (HCC)   5. Aortic atherosclerosis   6. PAF (paroxysmal atrial fibrillation) (HCC)   7. Secondary hypercoagulable state   8. BMI 31.0-31.9,adult     PLAN:   In order of problems listed above: History of acute coronary syndrome: Discontinue aspirin  81 mg, continue Eliquis  5 mg twice daily, rosuvastatin  20 mg, and Toprol  100 mg daily (for PAF) T2DM: Continue Eliquis  5 mg twice daily, rosuvastatin  20 mg daily, start Jardiance 10 mg daily; if blood pressure becomes an issue consider losartan in the future.   Hypertension: BP on my repeat measure today was within normal limits.  BP at home seems to be less than 130/80.  Defer losartan for now. Hyperlipidemia: Continue Zetia  10 mg daily, rosuvastatin  20 mg daily.  Check lipid panel, LFTs, LP(a) today.  Goal LDL is less than 55 given history of acute coronary syndrome. Aortic atherosclerosis: Continue Eliquis  5 mg twice daily, rosuvastatin  20 mg daily PAF: Status post PVI.  Continue Eliquis  5 mg twice daily, Toprol  100 mg daily Secondary hypercoagulable state: Continue Eliquis  5 mg twice daily Elevated BMI: Referred to Pharm.D. for GLP-1 receptor agonist therapy.            Dispo:  Return in about 6 months (around 04/26/2024).       I spent 38 minutes reviewing all clinical data during and prior to this visit including all relevant imaging studies, laboratories, clinical information from other health systems and  prior notes from both Cardiology and other specialties, interviewing the patient, conducting a complete physical examination, and coordinating care in order to formulate a comprehensive and personalized evaluation and treatment plan.   History of Present Illness:    FOCUSED PROBLEM LIST:   Acute coronary syndrome PCI 3.0 x 15 mm resolute LAD 2012; [place]  BAE, no significant valve issues, EF 50 to 55% TTE thousand 22 T2DM Not on insulin  Hypertension Hyperlipidemia Aortic atherosclerosis Chest CT 2023 PAF Status post PVI 2023 BMI 05 November 2023:  Patient consents to use of AI scribe. The patient returns for routine follow-up.  He was last seen by general cardiology in September 2024.  At that point in time he is doing well.  His blood pressure is well-controlled.  No changes were made to his medical regimen.  He had been taking 50 mg of his medication but noticed his blood pressure was elevated to 150/88 mmHg. He then resumed taking the full 100 mg dose, which has improved his condition. No palpitations, dizziness, blackouts, or chest pain. He has not required any hospital or emergency room visits recently.  He monitors his blood pressure at home, which has been around 120/80 mmHg to 128/80 mmHg. He has a history of diabetes and is mindful of his diet, avoiding sugar and salt. No issues with his current medications aside from the previous adjustment of his toprol  dose.  No bleeding issues while on blood thinners and no swelling in his legs. He had a myocardial infarction in 2011, after which he  quit smoking. He used to smoke about a pack a week. He engages in yard work and tries to stay active, although he mentions he should use his treadmill more often.  His wife had a stroke last year, and he has been taking care of her, which has limited his activities.     Current Medications: Current Meds  Medication Sig   ACCU-CHEK GUIDE TEST test strip USE AS INSTRUCTED TO CHECK BLOOD  SUGAR ONCE A DAY   Accu-Chek Softclix Lancets lancets USE AS INSTRUCTED TO CHECK BLOOD SUGAR ONCE A DAY   Blood Glucose Monitoring Suppl (ACCU-CHEK GUIDE ME) w/Device KIT Use to check blood sugar once a day   cyanocobalamin  (VITAMIN B12) 500 MCG tablet Take 1 tablet (500 mcg total) by mouth daily.   ELIQUIS  5 MG TABS tablet TAKE 1 TABLET BY MOUTH TWICE  DAILY   empagliflozin (JARDIANCE) 10 MG TABS tablet Take 1 tablet (10 mg total) by mouth daily before breakfast.   ezetimibe  (ZETIA ) 10 MG tablet Take 1 tablet (10 mg total) by mouth daily.   finasteride (PROSCAR) 5 MG tablet Take 5 mg by mouth daily.   fluticasone (FLONASE) 50 MCG/ACT nasal spray Place 1 spray into both nostrils daily as needed for allergies or rhinitis.   metFORMIN  (GLUCOPHAGE ) 500 MG tablet Take 1 tablet (500 mg total) by mouth daily with breakfast.   metoprolol  succinate (TOPROL -XL) 100 MG 24 hr tablet Take 100 mg by mouth daily. Take with or immediately following a meal.   Omega-3 Fatty Acids (FISH OIL) 1000 MG CAPS Take 2,000 mg by mouth daily.   potassium chloride (KLOR-CON) 20 MEQ packet Take 10 mEq by mouth 2 (two) times daily.   rosuvastatin  (CRESTOR ) 20 MG tablet Take 1 tablet (20 mg total) by mouth daily. Please call [phone] to schedule an appointment for future refills. Thank you. 1st attempt.   silodosin  (RAPAFLO ) 8 MG CAPS capsule Take 1 capsule (8 mg total) by mouth daily with breakfast.   [DISCONTINUED] aspirin  EC 81 MG tablet Take 81 mg by mouth daily. Swallow whole.   [DISCONTINUED] metoprolol  succinate (TOPROL -XL) 50 MG 24 hr tablet Take 1 tablet (50 mg total) by mouth at bedtime.     Review of Systems:   Please see the history of present illness.    All other systems reviewed and are negative.     EKGs/Labs/Other Test Reviewed:   EKG: 2025 sinus bradycardia incomplete right bundle branch block  EKG Interpretation Date/Time:    Ventricular Rate:    PR Interval:    QRS Duration:    QT  Interval:    QTC Calculation:   R Axis:      Text Interpretation:          CARDIAC STUDIES: Refer to CV Procedures and Imaging Tabs   Risk Assessment/Calculations:    CHA2DS2-VASc Score = 4   This indicates a 4.8% annual risk of stroke. The patient's score is based upon: CHF History: 0 HTN History: 1 Diabetes History: 1 Stroke History: 0 Vascular Disease History: 1 Age Score: 1 Gender Score: 0         Physical Exam:   VS:  BP 128/78   Pulse 98   Ht 5' 7 (1.702 m)   Wt 203 lb 3.2 oz (92.2 kg)   SpO2 96%   BMI 31.83 kg/m        Wt Readings from Last 3 Encounters:  10/27/23 203 lb 3.2 oz (92.2 kg)  08/06/23 202 lb (91.6  kg)  07/27/23 203 lb 12.8 oz (92.4 kg)      GENERAL:  No apparent distress, AOx3 HEENT:  No carotid bruits, +2 carotid impulses, no scleral icterus CAR: RRR no murmurs, gallops, rubs, or thrills RES:  Clear to auscultation bilaterally ABD:  Soft, nontender, nondistended, positive bowel sounds x 4 VASC:  +2 radial pulses, +2 carotid pulses NEURO:  CN 2-12 grossly intact; motor and sensory grossly intact PSYCH:  No active depression or anxiety EXT:  No edema, ecchymosis, or cyanosis  Signed, Amna Welker K Dung Prien, MD  10/27/2023 8:51 AM    [facility] [address] [place], [place], [place]  [address] Phone: [phone]; Fax: [phone]   Note:  This document was prepared using Dragon voice recognition software and may include unintentional dictation errors.

## 2023-10-27 ENCOUNTER — Encounter: Payer: Self-pay | Admitting: Internal Medicine

## 2023-10-27 ENCOUNTER — Other Ambulatory Visit (HOSPITAL_COMMUNITY): Payer: Self-pay

## 2023-10-27 ENCOUNTER — Ambulatory Visit: Attending: Internal Medicine | Admitting: Internal Medicine

## 2023-10-27 VITALS — BP 128/78 | HR 98 | Ht 67.0 in | Wt 203.2 lb

## 2023-10-27 DIAGNOSIS — I480 Paroxysmal atrial fibrillation: Secondary | ICD-10-CM

## 2023-10-27 DIAGNOSIS — E785 Hyperlipidemia, unspecified: Secondary | ICD-10-CM

## 2023-10-27 DIAGNOSIS — E1169 Type 2 diabetes mellitus with other specified complication: Secondary | ICD-10-CM

## 2023-10-27 DIAGNOSIS — E119 Type 2 diabetes mellitus without complications: Secondary | ICD-10-CM

## 2023-10-27 DIAGNOSIS — I249 Acute ischemic heart disease, unspecified: Secondary | ICD-10-CM | POA: Diagnosis not present

## 2023-10-27 DIAGNOSIS — Z6831 Body mass index (BMI) 31.0-31.9, adult: Secondary | ICD-10-CM

## 2023-10-27 DIAGNOSIS — E1159 Type 2 diabetes mellitus with other circulatory complications: Secondary | ICD-10-CM

## 2023-10-27 DIAGNOSIS — I152 Hypertension secondary to endocrine disorders: Secondary | ICD-10-CM

## 2023-10-27 DIAGNOSIS — I700 Atherosclerosis of aorta: Secondary | ICD-10-CM

## 2023-10-27 DIAGNOSIS — D6869 Other thrombophilia: Secondary | ICD-10-CM

## 2023-10-27 LAB — LIPID PANEL

## 2023-10-27 MED ORDER — EMPAGLIFLOZIN 10 MG PO TABS: 10.0000 mg | ORAL_TABLET | Freq: Every day | ORAL | 3 refills | Status: DC

## 2023-10-27 MED ORDER — EMPAGLIFLOZIN 10 MG PO TABS
10.0000 mg | ORAL_TABLET | Freq: Every day | ORAL | 3 refills | Status: DC
  Filled 2023-10-27: qty 30, 30d supply, fill #0

## 2023-10-27 NOTE — Addendum Note (Signed)
 Addended by: GORDON RONAL SQUIBB on: 10/27/2023 08:54 AM   Modules accepted: Orders

## 2023-10-27 NOTE — Patient Instructions (Addendum)
 Medication Instructions:  STOP Aspirin   START Jardiance 10 mg once daily   *If you need a refill on your cardiac medications before your next appointment, please call your pharmacy*  Lab Work: To be completed today: lipid panel, LFT, lipoprotein-a  If you have labs (blood work) drawn today and your tests are completely normal, you will receive your results only by: MyChart Message (if you have MyChart) OR A paper copy in the mail If you have any lab test that is abnormal or we need to change your treatment, we will call you to review the results.  Testing/Procedures: None ordered today.  Follow-Up: At [facility], you and your health needs are our priority.  As part of our continuing mission to provide you with exceptional heart care, our providers are all part of one team.  This team includes your primary Cardiologist (physician) and Advanced Practice Providers or APPs (Physician Assistants and Nurse Practitioners) who all work together to provide you with the care you need, when you need it.  Your next appointment:   6 month(s)  Provider:   Glendia Ferrier, PA-C    We recommend signing up for the patient portal called MyChart.  Sign up information is provided on this After Visit Summary.  MyChart is used to connect with patients for Virtual Visits (Telemedicine).  Patients are able to view lab/test results, encounter notes, upcoming appointments, etc.  Non-urgent messages can be sent to your provider as well.   To learn more about what you can do with MyChart, go to [email or web address].   Other Instructions You have been referred to our PharmD clinic to meet with a pharmacist. Someone will contact you from our office to make this appointment.

## 2023-10-29 ENCOUNTER — Ambulatory Visit: Payer: Self-pay | Admitting: Internal Medicine

## 2023-10-29 LAB — LIPID PANEL
Cholesterol, Total: 108 mg/dL (ref 100–199)
HDL: 48 mg/dL (ref >39)
LDL CALC COMMENT:: 2.3 ratio (ref 0.0–5.0)
LDL Chol Calc (NIH): 48 mg/dL (ref 0–99)
Triglycerides: 48 mg/dL (ref 0–149)
VLDL Cholesterol Cal: 12 mg/dL (ref 5–40)

## 2023-10-29 LAB — HEPATIC FUNCTION PANEL
ALT: 14 IU/L (ref 0–44)
AST: 28 IU/L (ref 0–40)
Albumin: 4.5 g/dL (ref 3.8–4.8)
Alkaline Phosphatase: 76 IU/L (ref 47–123)
Bilirubin Total: 0.6 mg/dL (ref 0.0–1.2)
Bilirubin, Direct: 0.23 mg/dL (ref 0.00–0.40)
Total Protein: 7.1 g/dL (ref 6.0–8.5)

## 2023-10-29 LAB — LIPOPROTEIN A (LPA): Lipoprotein (a): 77.3 nmol/L — AB (ref <75.0)

## 2023-11-18 ENCOUNTER — Other Ambulatory Visit: Payer: Self-pay | Admitting: Nurse Practitioner

## 2023-11-18 DIAGNOSIS — E1169 Type 2 diabetes mellitus with other specified complication: Secondary | ICD-10-CM

## 2024-01-02 ENCOUNTER — Other Ambulatory Visit: Payer: Self-pay | Admitting: Internal Medicine

## 2024-01-04 LAB — OPHTHALMOLOGY REPORT-SCANNED

## 2024-01-28 ENCOUNTER — Ambulatory Visit: Payer: Medicare Other

## 2024-01-31 ENCOUNTER — Other Ambulatory Visit

## 2024-02-02 ENCOUNTER — Other Ambulatory Visit: Payer: Self-pay | Admitting: Family Medicine

## 2024-02-02 DIAGNOSIS — M109 Gout, unspecified: Secondary | ICD-10-CM

## 2024-02-02 DIAGNOSIS — E1169 Type 2 diabetes mellitus with other specified complication: Secondary | ICD-10-CM

## 2024-02-02 DIAGNOSIS — I4819 Other persistent atrial fibrillation: Secondary | ICD-10-CM

## 2024-02-02 DIAGNOSIS — N402 Nodular prostate without lower urinary tract symptoms: Secondary | ICD-10-CM

## 2024-02-02 DIAGNOSIS — E538 Deficiency of other specified B group vitamins: Secondary | ICD-10-CM

## 2024-02-03 ENCOUNTER — Other Ambulatory Visit

## 2024-02-03 DIAGNOSIS — M109 Gout, unspecified: Secondary | ICD-10-CM

## 2024-02-03 DIAGNOSIS — N402 Nodular prostate without lower urinary tract symptoms: Secondary | ICD-10-CM | POA: Diagnosis not present

## 2024-02-03 DIAGNOSIS — I4819 Other persistent atrial fibrillation: Secondary | ICD-10-CM

## 2024-02-03 DIAGNOSIS — R8290 Unspecified abnormal findings in urine: Secondary | ICD-10-CM

## 2024-02-03 DIAGNOSIS — E1169 Type 2 diabetes mellitus with other specified complication: Secondary | ICD-10-CM

## 2024-02-03 DIAGNOSIS — E538 Deficiency of other specified B group vitamins: Secondary | ICD-10-CM | POA: Diagnosis not present

## 2024-02-03 DIAGNOSIS — E785 Hyperlipidemia, unspecified: Secondary | ICD-10-CM

## 2024-02-03 LAB — CBC WITH DIFFERENTIAL/PLATELET
Basophils Absolute: 0 10*3/uL (ref 0.0–0.1)
Basophils Relative: 0.5 % (ref 0.0–3.0)
Eosinophils Absolute: 0.2 10*3/uL (ref 0.0–0.7)
Eosinophils Relative: 4 % (ref 0.0–5.0)
HCT: 40.2 % (ref 39.0–52.0)
Hemoglobin: 13.7 g/dL (ref 13.0–17.0)
Lymphocytes Relative: 29.4 % (ref 12.0–46.0)
Lymphs Abs: 1.3 10*3/uL (ref 0.7–4.0)
MCHC: 34 g/dL (ref 30.0–36.0)
MCV: 98.4 fl (ref 78.0–100.0)
Monocytes Absolute: 0.3 10*3/uL (ref 0.1–1.0)
Monocytes Relative: 7 % (ref 3.0–12.0)
Neutro Abs: 2.6 10*3/uL (ref 1.4–7.7)
Neutrophils Relative %: 59.1 % (ref 43.0–77.0)
Platelets: 161 10*3/uL (ref 150.0–400.0)
RBC: 4.09 Mil/uL — ABNORMAL LOW (ref 4.22–5.81)
RDW: 13.4 % (ref 11.5–15.5)
WBC: 4.4 10*3/uL (ref 4.0–10.5)

## 2024-02-03 LAB — COMPREHENSIVE METABOLIC PANEL WITH GFR
ALT: 14 U/L (ref 3–53)
AST: 24 U/L (ref 5–37)
Albumin: 4.4 g/dL (ref 3.5–5.2)
Alkaline Phosphatase: 59 U/L (ref 39–117)
BUN: 12 mg/dL (ref 6–23)
CO2: 32 meq/L (ref 19–32)
Calcium: 8.8 mg/dL (ref 8.4–10.5)
Chloride: 103 meq/L (ref 96–112)
Creatinine, Ser: 0.67 mg/dL (ref 0.40–1.50)
GFR: 93.38 mL/min
Glucose, Bld: 120 mg/dL — ABNORMAL HIGH (ref 70–99)
Potassium: 4.4 meq/L (ref 3.5–5.1)
Sodium: 139 meq/L (ref 135–145)
Total Bilirubin: 0.6 mg/dL (ref 0.2–1.2)
Total Protein: 6.9 g/dL (ref 6.0–8.3)

## 2024-02-03 LAB — URINALYSIS, ROUTINE W REFLEX MICROSCOPIC
Bilirubin Urine: NEGATIVE
Ketones, ur: NEGATIVE
Nitrite: NEGATIVE
Specific Gravity, Urine: 1.02 (ref 1.000–1.030)
Total Protein, Urine: NEGATIVE
Urine Glucose: >=1000 — AB
Urobilinogen, UA: 0.2 (ref 0.0–1.0)
pH: 5.5 (ref 5.0–8.0)

## 2024-02-03 LAB — LIPID PANEL
Cholesterol: 92 mg/dL (ref 28–200)
HDL: 41.5 mg/dL
LDL Cholesterol: 41 mg/dL (ref 10–99)
NonHDL: 50.55
Total CHOL/HDL Ratio: 2
Triglycerides: 47 mg/dL (ref 10.0–149.0)
VLDL: 9.4 mg/dL (ref 0.0–40.0)

## 2024-02-03 LAB — VITAMIN B12: Vitamin B-12: 265 pg/mL (ref 211–911)

## 2024-02-03 LAB — MICROALBUMIN / CREATININE URINE RATIO
Creatinine,U: 59.3 mg/dL
Microalb Creat Ratio: 82.2 mg/g — ABNORMAL HIGH (ref 0.0–30.0)
Microalb, Ur: 4.9 mg/dL — ABNORMAL HIGH (ref 0.7–1.9)

## 2024-02-03 LAB — PSA: PSA: 0.33 ng/mL (ref 0.10–4.00)

## 2024-02-03 LAB — URIC ACID: Uric Acid, Serum: 3.8 mg/dL — ABNORMAL LOW (ref 4.0–7.8)

## 2024-02-03 LAB — HEMOGLOBIN A1C: Hgb A1c MFr Bld: 6.8 % — ABNORMAL HIGH (ref 4.6–6.5)

## 2024-02-05 ENCOUNTER — Ambulatory Visit: Payer: Self-pay | Admitting: Family Medicine

## 2024-02-07 ENCOUNTER — Encounter: Admitting: Family Medicine

## 2024-03-20 ENCOUNTER — Encounter: Admitting: Family Medicine

## 2024-03-27 ENCOUNTER — Ambulatory Visit
